# Patient Record
Sex: Male | Born: 1970 | Race: White | Hispanic: No | Marital: Married | Smoking: Former smoker
Health system: Southern US, Community
[De-identification: ages and names within clinical notes are randomized; demographics above are authoritative.]

## PROBLEM LIST (undated history)

## (undated) DIAGNOSIS — K219 Gastro-esophageal reflux disease without esophagitis: Secondary | ICD-10-CM

## (undated) DIAGNOSIS — I82409 Acute embolism and thrombosis of unspecified deep veins of unspecified lower extremity: Secondary | ICD-10-CM

## (undated) DIAGNOSIS — A0472 Enterocolitis due to Clostridium difficile, not specified as recurrent: Secondary | ICD-10-CM

## (undated) DIAGNOSIS — M25511 Pain in right shoulder: Secondary | ICD-10-CM

## (undated) DIAGNOSIS — E7800 Pure hypercholesterolemia, unspecified: Secondary | ICD-10-CM

## (undated) DIAGNOSIS — D6851 Activated protein C resistance: Secondary | ICD-10-CM

## (undated) DIAGNOSIS — I10 Essential (primary) hypertension: Secondary | ICD-10-CM

## (undated) DIAGNOSIS — R011 Cardiac murmur, unspecified: Secondary | ICD-10-CM

## (undated) HISTORY — DX: Acute embolism and thrombosis of unspecified deep veins of unspecified lower extremity: I82.409

## (undated) HISTORY — DX: Pure hypercholesterolemia, unspecified: E78.00

## (undated) HISTORY — PX: OTHER SURGICAL HISTORY: SHX169

## (undated) HISTORY — DX: Gastro-esophageal reflux disease without esophagitis: K21.9

## (undated) HISTORY — PX: APPENDECTOMY: SHX54

## (undated) HISTORY — DX: Pain in right shoulder: M25.511

## (undated) HISTORY — PX: VASECTOMY: SHX75

---

## 2011-07-09 ENCOUNTER — Encounter: Payer: Self-pay | Admitting: *Deleted

## 2011-07-09 ENCOUNTER — Emergency Department (HOSPITAL_COMMUNITY)
Admission: EM | Admit: 2011-07-09 | Discharge: 2011-07-09 | Disposition: A | Discharge status: Home / Self Care | Payer: Self-pay | Attending: Emergency Medicine | Admitting: Emergency Medicine

## 2011-07-09 DIAGNOSIS — L0390 Cellulitis, unspecified: Secondary | ICD-10-CM

## 2011-07-09 DIAGNOSIS — L02519 Cutaneous abscess of unspecified hand: Secondary | ICD-10-CM | POA: Insufficient documentation

## 2011-07-09 DIAGNOSIS — F17200 Nicotine dependence, unspecified, uncomplicated: Secondary | ICD-10-CM | POA: Insufficient documentation

## 2011-07-09 DIAGNOSIS — I10 Essential (primary) hypertension: Secondary | ICD-10-CM | POA: Insufficient documentation

## 2011-07-09 DIAGNOSIS — L03119 Cellulitis of unspecified part of limb: Secondary | ICD-10-CM | POA: Insufficient documentation

## 2011-07-09 HISTORY — DX: Essential (primary) hypertension: I10

## 2011-07-09 MED ORDER — CEPHALEXIN 500 MG PO CAPS: 500.0000 mg | ORAL_CAPSULE | Freq: Four times a day (QID) | ORAL | Status: AC

## 2011-07-09 MED ORDER — HYDROCODONE-ACETAMINOPHEN 5-500 MG PO TABS: 1.0000 | ORAL_TABLET | Freq: Four times a day (QID) | ORAL | Status: AC | PRN

## 2011-07-09 MED ORDER — CEFTRIAXONE SODIUM 1 G IJ SOLR
1.0000 g | Freq: Once | INTRAMUSCULAR | Status: AC
  Administered 2011-07-09: 1 g via INTRAMUSCULAR
  Filled 2011-07-09: qty 1

## 2011-07-09 MED ORDER — STERILE WATER FOR INJECTION IJ SOLN
10.0000 mL | Freq: Once | INTRAMUSCULAR | Status: AC
  Administered 2011-07-09: 10 mL via INTRAMUSCULAR

## 2011-07-09 MED ORDER — CEFTRIAXONE SODIUM 250 MG IJ SOLR
1000.0000 mg | Freq: Once | INTRAMUSCULAR | Status: DC
  Filled 2011-07-09: qty 250

## 2011-07-09 MED ORDER — CEPHALEXIN 500 MG PO CAPS
500.0000 mg | ORAL_CAPSULE | Freq: Once | ORAL | Status: AC
  Administered 2011-07-09: 500 mg via ORAL
  Filled 2011-07-09: qty 1

## 2011-07-09 MED ORDER — HYDROCODONE-ACETAMINOPHEN 5-325 MG PO TABS
2.0000 | ORAL_TABLET | Freq: Once | ORAL | Status: AC
  Administered 2011-07-09: 2 via ORAL
  Filled 2011-07-09: qty 2

## 2011-07-09 NOTE — ED Provider Notes (Signed)
History     CSN: [id number] Arrival date & time: 07/09/2011  1:05 PM  Chief Complaint  Patient presents with  . Wound Infection   HPI Comments: Had a blister on the right hand from his wedding band.  Picked at it, now it is swollen, red, and painful.  No fevers or chills.  No other injury or trauma.  The history is provided by the patient.    Past Medical History  Diagnosis Date  . Hypertension     Past Surgical History  Procedure Date  . Appendectomy   . Vasectomy     History reviewed. No pertinent family history.  History  Substance Use Topics  . Smoking status: Current Everyday Smoker -- 1.5 packs/day    Types: Cigarettes  . Smokeless tobacco: Not on file  . Alcohol Use: No      Review of Systems  Constitutional: Negative.  Negative for fever and chills.  HENT: Negative.   Musculoskeletal:       Hand pain as above  Skin:       Redness, swelling as above.    Physical Exam  BP 144/101  Pulse 80  Temp(Src) 98.4 F (36.9 C) (Oral)  Resp 16  Ht 5\' 8"  (1.727 m)  Wt 170 lb (77.111 kg)  BMI 25.85 kg/m2  SpO2 100%  Physical Exam  Constitutional: He appears well-developed and well-nourished. No distress.  HENT:  Head: Normocephalic and atraumatic.  Neck: Normal range of motion. Neck supple.  Musculoskeletal:       The right hand is noted to have swelling, redness, warmth to the volar aspect.  There is some pain with rom.  Skin: He is not diaphoretic.    ED Course  Procedures  MDM Patient advised that if this does not improve in the next 1-2 days, he needs to return to the ER for re-evaluation, may require surgery.      Geoffery Lyons, MD 07/09/11 [phone]

## 2011-07-09 NOTE — ED Notes (Signed)
Pt has pain, swelling and redness to his left hand. Pt had a blister on the bottom of his ring finger approx. 1 week ago and then the symptoms started yesterday.

## 2012-02-09 ENCOUNTER — Other Ambulatory Visit (HOSPITAL_COMMUNITY): Payer: Self-pay | Admitting: Family Medicine

## 2012-02-09 ENCOUNTER — Ambulatory Visit (HOSPITAL_COMMUNITY)
Admission: RE | Admit: 2012-02-09 | Discharge: 2012-02-09 | Disposition: A | Discharge status: Home / Self Care | Payer: BC Managed Care – PPO | Source: Ambulatory Visit | Attending: Family Medicine | Admitting: Family Medicine

## 2012-02-09 DIAGNOSIS — M7730 Calcaneal spur, unspecified foot: Secondary | ICD-10-CM | POA: Insufficient documentation

## 2012-02-09 DIAGNOSIS — M12879 Other specific arthropathies, not elsewhere classified, unspecified ankle and foot: Secondary | ICD-10-CM

## 2012-06-20 ENCOUNTER — Emergency Department (HOSPITAL_COMMUNITY)
Admission: EM | Admit: 2012-06-20 | Discharge: 2012-06-20 | Disposition: A | Discharge status: Home / Self Care | Payer: BC Managed Care – PPO | Attending: Emergency Medicine | Admitting: Emergency Medicine

## 2012-06-20 ENCOUNTER — Emergency Department (HOSPITAL_COMMUNITY): Payer: BC Managed Care – PPO

## 2012-06-20 ENCOUNTER — Encounter (HOSPITAL_COMMUNITY): Payer: Self-pay | Admitting: *Deleted

## 2012-06-20 DIAGNOSIS — IMO0002 Reserved for concepts with insufficient information to code with codable children: Secondary | ICD-10-CM

## 2012-06-20 DIAGNOSIS — F17200 Nicotine dependence, unspecified, uncomplicated: Secondary | ICD-10-CM | POA: Insufficient documentation

## 2012-06-20 DIAGNOSIS — M6580 Other synovitis and tenosynovitis, unspecified site: Secondary | ICD-10-CM | POA: Insufficient documentation

## 2012-06-20 DIAGNOSIS — I10 Essential (primary) hypertension: Secondary | ICD-10-CM | POA: Insufficient documentation

## 2012-06-20 DIAGNOSIS — M79631 Pain in right forearm: Secondary | ICD-10-CM

## 2012-06-20 DIAGNOSIS — M79609 Pain in unspecified limb: Secondary | ICD-10-CM | POA: Insufficient documentation

## 2012-06-20 DIAGNOSIS — Z79899 Other long term (current) drug therapy: Secondary | ICD-10-CM | POA: Insufficient documentation

## 2012-06-20 MED ORDER — IBUPROFEN 800 MG PO TABS
800.0000 mg | ORAL_TABLET | Freq: Once | ORAL | Status: AC
  Administered 2012-06-20: 800 mg via ORAL
  Filled 2012-06-20: qty 1

## 2012-06-20 MED ORDER — HYDROCODONE-ACETAMINOPHEN 5-325 MG PO TABS: ORAL_TABLET | ORAL | Status: AC

## 2012-06-20 MED ORDER — HYDROCODONE-ACETAMINOPHEN 5-325 MG PO TABS
1.0000 | ORAL_TABLET | Freq: Once | ORAL | Status: DC
  Filled 2012-06-20: qty 1

## 2012-06-20 NOTE — ED Provider Notes (Signed)
History     CSN: [id number]  Arrival date & time 06/20/12  1844   First MD Initiated Contact with Patient 06/20/12 1915      Chief Complaint  Patient presents with  . Arm Pain    (Consider location/radiation/quality/duration/timing/severity/associated sxs/prior treatment) HPI Comments: Patient c/o pain to the right forearm for several months.  States the pain is worse with movement and lifting objects with the right arm and improves with rest.  States he lifts and pulls on objects most of the day.  He states occasionally the pain radiates up to his shoulder and neck.  He denies recent injury, swelling, numbness, chest pain, shortness of breath or weakness  The history is provided by the patient.    Past Medical History  Diagnosis Date  . Hypertension     Past Surgical History  Procedure Date  . Appendectomy   . Vasectomy     History reviewed. No pertinent family history.  History  Substance Use Topics  . Smoking status: Current Everyday Smoker -- 1.5 packs/day    Types: Cigarettes  . Smokeless tobacco: Not on file  . Alcohol Use: No      Review of Systems  Constitutional: Negative for fever and chills.  Genitourinary: Negative for dysuria and difficulty urinating.  Musculoskeletal: Positive for joint swelling and arthralgias.  Skin: Negative for color change and wound.  All other systems reviewed and are negative.    Allergies  Benicar  Home Medications   Current Outpatient Rx  Name Route Sig Dispense Refill  . AMLODIPINE BESYLATE 10 MG PO TABS Oral Take 10 mg by mouth daily.      Deeann Dowse EXTRA STRENGTH PO Oral Take 1 Container by mouth daily as needed. For pain     . HYDROCHLOROTHIAZIDE 25 MG PO TABS Oral Take 25 mg by mouth daily.      Marland Kitchen NAPROXEN 500 MG PO TABS Oral Take 500 mg by mouth 2 (two) times daily with a meal.    . PANTOPRAZOLE SODIUM 40 MG PO TBEC Oral Take 40 mg by mouth daily.    Marland Kitchen SIMVASTATIN 20 MG PO TABS Oral Take 20 mg by mouth every  evening.      BP 122/75  Pulse 79  Temp 98.4 F (36.9 C) (Oral)  Resp 20  Ht 5\' 8"  (1.727 m)  Wt 170 lb (77.111 kg)  BMI 25.85 kg/m2  SpO2 100%  Physical Exam  Nursing note and vitals reviewed. Constitutional: He is oriented to person, place, and time. He appears well-developed and well-nourished. No distress.  HENT:  Head: Normocephalic and atraumatic.  Cardiovascular: Normal rate, regular rhythm and normal heart sounds.   Pulmonary/Chest: Effort normal and breath sounds normal.  Musculoskeletal: He exhibits tenderness. He exhibits no edema.       Right elbow: He exhibits normal range of motion, no swelling, no effusion, no deformity and no laceration. tenderness found. Lateral epicondyle tenderness noted.       Arms:      ttp of the muscles of the right forearm and at the lateral epicondyle.  Radial pulse is brisk, sensation intact.  CR< 2 sec.  No bruising or deformity.  Patient has full ROM.  Neurological: He is alert and oriented to person, place, and time. No cranial nerve deficit or sensory deficit. He exhibits normal muscle tone. Coordination normal.  Reflex Scores:      Tricep reflexes are 2+ on the right side and 2+ on the left side.  Bicep reflexes are 2+ on the right side and 2+ on the left side. Skin: Skin is warm and dry.    ED Course  Procedures (including critical care time)  Labs Reviewed - No data to display Dg Forearm Right  06/20/2012  *RADIOLOGY REPORT*  Clinical Data: Right forearm pain for several months.  RIGHT FOREARM - 2 VIEW  Comparison: None.  Findings: There is no evidence of fracture or dislocation.  There is no evidence of arthropathy or other focal bony abnormality. Soft tissues are unremarkable.  IMPRESSION: Negative exam.  Original Report Authenticated By: Elsie Stain, M.D.        MDM     Sling applied to the right arm. Pain is reproduced with palpation and movement of the right forearm. Tenderness over the muscles of the  forearm. And the lateral epicondyles. No bony abnormality. Radial pulse is brisk, distal sensation is intact,  cap refill is less than 2 seconds. Patient has a appointment with his primary care physician in 2 days. Symptoms are likely related to a tendinitis I will also give him a referral to Dr. Romeo Apple   The patient appears reasonably screened and/or stabilized for discharge and I doubt any other medical condition or other [facility] requiring further screening, evaluation, or treatment in the ED at this time prior to discharge.   Prescribed:  norco #[id number]   L. [place], [place] 06/26/12 2130

## 2012-06-20 NOTE — ED Notes (Signed)
Right mid arm pain for couple of months per pt, denies recent injury, hx fall 2 years ago

## 2012-06-20 NOTE — ED Notes (Signed)
Pt stable at discharge Pt instructed not to drive while on pain medication Verbalizes understanding

## 2012-06-29 NOTE — ED Provider Notes (Signed)
Medical screening examination/treatment/procedure(s) were performed by non-physician practitioner and as supervising physician I was immediately available for consultation/collaboration.   , MD 06/29/12 0722 

## 2012-12-05 DIAGNOSIS — I82409 Acute embolism and thrombosis of unspecified deep veins of unspecified lower extremity: Secondary | ICD-10-CM

## 2012-12-05 HISTORY — DX: Acute embolism and thrombosis of unspecified deep veins of unspecified lower extremity: I82.409

## 2013-01-08 ENCOUNTER — Other Ambulatory Visit (HOSPITAL_COMMUNITY): Payer: Self-pay | Admitting: Orthopaedic Surgery

## 2013-01-08 DIAGNOSIS — M25511 Pain in right shoulder: Secondary | ICD-10-CM

## 2013-01-10 ENCOUNTER — Other Ambulatory Visit (HOSPITAL_COMMUNITY): Payer: BC Managed Care – PPO

## 2013-01-11 ENCOUNTER — Ambulatory Visit (HOSPITAL_COMMUNITY)
Admission: RE | Admit: 2013-01-11 | Discharge: 2013-01-11 | Disposition: A | Discharge status: Home / Self Care | Payer: BC Managed Care – PPO | Source: Ambulatory Visit | Attending: Orthopaedic Surgery | Admitting: Orthopaedic Surgery

## 2013-01-11 ENCOUNTER — Encounter (HOSPITAL_COMMUNITY): Payer: Self-pay

## 2013-01-11 DIAGNOSIS — R937 Abnormal findings on diagnostic imaging of other parts of musculoskeletal system: Secondary | ICD-10-CM | POA: Insufficient documentation

## 2013-01-11 DIAGNOSIS — M25511 Pain in right shoulder: Secondary | ICD-10-CM

## 2013-01-11 DIAGNOSIS — M25519 Pain in unspecified shoulder: Secondary | ICD-10-CM | POA: Insufficient documentation

## 2013-01-11 DIAGNOSIS — M719 Bursopathy, unspecified: Secondary | ICD-10-CM | POA: Insufficient documentation

## 2013-01-11 DIAGNOSIS — M67919 Unspecified disorder of synovium and tendon, unspecified shoulder: Secondary | ICD-10-CM | POA: Insufficient documentation

## 2013-04-01 ENCOUNTER — Encounter (HOSPITAL_COMMUNITY): Payer: Self-pay | Admitting: Oncology

## 2013-04-01 ENCOUNTER — Encounter (HOSPITAL_COMMUNITY): Payer: BC Managed Care – PPO | Attending: Oncology | Admitting: Oncology

## 2013-04-01 VITALS — BP 128/90 | HR 74 | Temp 98.4°F | Resp 18 | Ht 66.25 in | Wt 174.4 lb

## 2013-04-01 DIAGNOSIS — Z832 Family history of diseases of the blood and blood-forming organs and certain disorders involving the immune mechanism: Secondary | ICD-10-CM

## 2013-04-01 DIAGNOSIS — D6859 Other primary thrombophilia: Secondary | ICD-10-CM | POA: Insufficient documentation

## 2013-04-01 DIAGNOSIS — F17200 Nicotine dependence, unspecified, uncomplicated: Secondary | ICD-10-CM | POA: Insufficient documentation

## 2013-04-01 NOTE — Patient Instructions (Addendum)
[facility] Discharge Instructions  RECOMMENDATIONS MADE BY THE CONSULTANT AND ANY TEST RESULTS WILL BE SENT TO YOUR REFERRING PHYSICIAN.  EXAM FINDINGS BY THE PHYSICIAN TODAY AND SIGNS OR SYMPTOMS TO REPORT TO CLINIC OR PRIMARY PHYSICIAN: exam and discussion by MD.  Need to check some blood work and if we need to do anything we will call you.   MEDICATIONS PRESCRIBED:  none   SPECIAL INSTRUCTIONS/FOLLOW-UP: Will schedule return if needed.  Thank you for choosing [facility] to provide your oncology and hematology care.  To afford each patient quality time with our providers, please arrive at least 15 minutes before your scheduled appointment time.  With your help, our goal is to use those 15 minutes to complete the necessary work-up to ensure our physicians have the information they need to help with your evaluation and healthcare recommendations.    Effective January 1st, 2014, we ask that you re-schedule your appointment with our physicians should you arrive 10 or more minutes late for your appointment.  We strive to give you quality time with our providers, and arriving late affects you and other patients whose appointments are after yours.    Again, thank you for choosing [facility].  Our hope is that these requests will decrease the amount of time that you wait before being seen by our physicians.       _____________________________________________________________  Should you have questions after your visit to [facility], please contact our office at [phone] between the hours of 8:30 a.m. and 5:00 p.m.  Voicemails left after 4:30 p.m. will not be returned until the following business day.  For prescription refill requests, have your pharmacy contact our office with your prescription refill request.

## 2013-04-01 NOTE — Progress Notes (Signed)
#  1 history of factor V Leiden deficiency and his mother and niece #2 right rotator cuff damage in need of repair #3 long-standing smoking history of approximately 25 pack years. #4 history of vasectomy without complication #5 history of Teeth removal without complication #6 history of appendectomy without complication #7 history of bunionectomy without complication  This is a very pleasant 42 year old gentleman who has right shoulder pain since last year which is gotten worse and was found to have at least a partial right rotator cuff tear. He is in need of surgery in June.  His family history is absolutely positive for factor V Leiden he states his mother was had a blood clot in her one leg and he has a sister's daughter who has also been on Coumadin for factor V mutation. He believes his sister also has that but she has not been absolutely tested that he is aware of. He has one other brother and sister who have no history.  His father is still alive but with sleep apnea syndrome. He and his mother are divorced.  He himself has 3 children in good health. He and his wife both smoke.  He does not drink or use drugs.  BP 128/90  Pulse 74  Temp(Src) 98.4 F (36.9 C) (Oral)  Resp 18  Ht 5' 6.25" (1.683 m)  Wt 174 lb 6.4 oz (79.107 kg)  BMI 27.93 kg/m2  He is in no acute distress. He has slow but almost full range of motion of the right shoulder joint. He complains of pain with movement. He however has no adenopathy in any location. His lungs are clear but with diminished breath sounds. Heart shows a regular rhythm and rate without murmur rub or gallop. He has an upper dental plate. Throat is clear. He has almost a geographic tongue. He has no thyromegaly. Skin exam is negative for abnormal lesions. He has no hepatosplenomegaly. Bowel sounds are normal. He has no obvious ascites. No leg edema. Pulse 1-2+ and symmetric. He is right handed. Nails are unremarkable without clubbing. There is no  cyanosis. He is alert and oriented. He does wear glasses.  We spent the majority of time talking about his smoking history and the need for cessation. He is absolutely at risk for developing cardiovascular disease, cancer etc. He was given information about [place] smoking cessation classes. I think he and his wife need to do things together.  From the standpoint of his family history he states that he was once tested 5 years ago or so and was told that he thought it was negative.  Only factor V Leiden will be evaluated since he has no personal history of clotting nor does have postoperative complications in his history. We will see him in the future if need be. I suspect the test will be negative.

## 2013-04-02 ENCOUNTER — Encounter (HOSPITAL_BASED_OUTPATIENT_CLINIC_OR_DEPARTMENT_OTHER): Payer: BC Managed Care – PPO

## 2013-04-02 DIAGNOSIS — Z832 Family history of diseases of the blood and blood-forming organs and certain disorders involving the immune mechanism: Secondary | ICD-10-CM

## 2013-04-02 LAB — COMPREHENSIVE METABOLIC PANEL
BUN: 10 mg/dL (ref 6–23)
CO2: 26 mEq/L (ref 19–32)
Calcium: 9.3 mg/dL (ref 8.4–10.5)
Chloride: 102 mEq/L (ref 96–112)
Creatinine, Ser: 1.12 mg/dL (ref 0.50–1.35)
GFR calc Af Amer: >90 mL/min (ref >90)
GFR calc non Af Amer: 79 mL/min — ABNORMAL LOW (ref >90)
Glucose, Bld: 105 mg/dL — ABNORMAL HIGH (ref 70–99)
Total Bilirubin: 0.5 mg/dL (ref 0.3–1.2)

## 2013-04-02 LAB — PROTIME-INR
INR: 0.97 (ref 0.00–1.49)
Prothrombin Time: 12.8 seconds (ref 11.6–15.2)

## 2013-04-02 LAB — CBC WITH DIFFERENTIAL/PLATELET
Basophils Absolute: 0 10*3/uL (ref 0.0–0.1)
Eosinophils Relative: 1 % (ref 0–5)
HCT: 40.5 % (ref 39.0–52.0)
Hemoglobin: 13.7 g/dL (ref 13.0–17.0)
Lymphocytes Relative: 27 % (ref 12–46)
Lymphs Abs: 1.9 10*3/uL (ref 0.7–4.0)
MCV: 85.1 fL (ref 78.0–100.0)
Monocytes Absolute: 0.5 10*3/uL (ref 0.1–1.0)
Monocytes Relative: 6 % (ref 3–12)
RDW: 14 % (ref 11.5–15.5)
WBC: 7.3 10*3/uL (ref 4.0–10.5)

## 2013-04-02 LAB — APTT: aPTT: 33 seconds (ref 24–37)

## 2013-04-02 NOTE — Progress Notes (Signed)
Labs drawn today for cbc/diff,cmp,pt/ptt,factor 5

## 2013-05-01 ENCOUNTER — Other Ambulatory Visit: Payer: Self-pay | Admitting: Radiology

## 2013-05-01 ENCOUNTER — Encounter (HOSPITAL_COMMUNITY): Payer: Self-pay | Admitting: Pharmacy Technician

## 2013-05-07 ENCOUNTER — Encounter (HOSPITAL_COMMUNITY)
Admission: RE | Admit: 2013-05-07 | Discharge: 2013-05-07 | Disposition: A | Discharge status: Home / Self Care | Payer: BC Managed Care – PPO | Source: Ambulatory Visit | Attending: Orthopaedic Surgery | Admitting: Orthopaedic Surgery

## 2013-05-07 ENCOUNTER — Other Ambulatory Visit: Payer: Self-pay

## 2013-05-07 ENCOUNTER — Encounter (HOSPITAL_COMMUNITY): Payer: Self-pay

## 2013-05-07 HISTORY — DX: Cardiac murmur, unspecified: R01.1

## 2013-05-07 LAB — CBC WITH DIFFERENTIAL/PLATELET
Basophils Absolute: 0 10*3/uL (ref 0.0–0.1)
Eosinophils Relative: 2 % (ref 0–5)
HCT: 38.9 % — ABNORMAL LOW (ref 39.0–52.0)
Hemoglobin: 13.6 g/dL (ref 13.0–17.0)
Lymphocytes Relative: 29 % (ref 12–46)
Lymphs Abs: 2 10*3/uL (ref 0.7–4.0)
MCV: 83.1 fL (ref 78.0–100.0)
Monocytes Absolute: 0.5 10*3/uL (ref 0.1–1.0)
Monocytes Relative: 7 % (ref 3–12)
Neutro Abs: 4.1 10*3/uL (ref 1.7–7.7)
RBC: 4.68 MIL/uL (ref 4.22–5.81)
WBC: 6.7 10*3/uL (ref 4.0–10.5)

## 2013-05-07 LAB — COMPREHENSIVE METABOLIC PANEL
AST: 23 U/L (ref 0–37)
CO2: 26 mEq/L (ref 19–32)
Calcium: 9.6 mg/dL (ref 8.4–10.5)
Chloride: 102 mEq/L (ref 96–112)
Creatinine, Ser: 1.07 mg/dL (ref 0.50–1.35)
GFR calc Af Amer: >90 mL/min (ref >90)
GFR calc non Af Amer: 83 mL/min — ABNORMAL LOW (ref >90)
Glucose, Bld: 125 mg/dL — ABNORMAL HIGH (ref 70–99)
Total Bilirubin: 0.3 mg/dL (ref 0.3–1.2)

## 2013-05-07 LAB — SURGICAL PCR SCREEN: Staphylococcus aureus: POSITIVE — AB

## 2013-05-07 MED ORDER — CHLORHEXIDINE GLUCONATE 4 % EX LIQD: 60.0000 mL | Freq: Once | CUTANEOUS | Status: DC

## 2013-05-07 NOTE — Patient Instructions (Signed)
Jonell JAYSTEN ESSNER  05/07/2013   Your procedure is scheduled on:  Tuesday, 05/14/13  Report to [facility] at [place] AM.  Call this number if you have problems the morning of surgery: [phone]   Remember:   Do not eat food or drink liquids after midnight.   Take these medicines the morning of surgery with A SIP OF WATER: norco, norvasc, HCTZ (hydrochlorothiazide), protonix   Do not wear jewelry, make-up or nail polish.  Do not wear lotions, powders, or perfumes. You may wear deodorant.  Do not shave 48 hours prior to surgery. Men may shave face and neck.  Do not bring valuables to the hospital.  [facility] is not responsible                   for any belongings or valuables.  Contacts, dentures or bridgework may not be worn into surgery.  Leave suitcase in the car. After surgery it may be brought to your room.  For patients admitted to the hospital, checkout time is 11:00 AM the day of  discharge.   Patients discharged the day of surgery will not be allowed to drive  home.  Name and phone number of your driver: family  Special Instructions: Incentive Spirometry - Practice and bring it with you on the day of surgery. Shower using CHG 2 nights before surgery and the night before surgery.  If you shower the day of surgery use CHG.  Use special wash - you have one bottle of CHG for all showers.  You should use approximately 1/3 of the bottle for each shower.   Please read over the following fact sheets that you were given: Pain Booklet, MRSA Information, Surgical Site Infection Prevention, Anesthesia Post-op Instructions and Care and Recovery After Surgery Rotator Cuff Tear The rotator cuff is four tendons that assist in the motion of the shoulder. A rotator cuff tear is a tear in one of these four tendons. It is characterized by pain and weakness of the shoulder. The rotator cuff tendons surround the shoulder ball and socket joint (humeral head). The rotator cuff tendons attach to the shoulder blade  (scapula) on one side and the upper arm bone (humerus) on the other side. The rotator cuff is essential for shoulder stability and shoulder motion. SYMPTOMS   Pain around the shoulder, often at the outer portion of the upper arm.  Pain that is worse with shoulder function, especially when reaching overhead or lifting.  Weakness of the shoulder muscles.  Aching when not using your arm; often, pain awakens you at night, especially when sleeping on the affected side.  Tenderness, swelling, warmth, or redness over the outer aspect of the shoulder.  Loss of strength.  Limited motion of the shoulder, especially reaching behind (reaching into one's back pocket) or across your body.  A crackling sound (crepitation) when moving the shoulder.  Biceps tendon pain (in the front of the shoulder) and inflammation, worse with bending the elbow or lifting. CAUSES   Strain from sudden increase in amount or intensity of activity.  Direct blow or injury to the shoulder.  Aging, wear from from normal use.  Roof of the shoulder (acromial) spur. RISK INCREASES WITH:   Contact sports (football, wrestling, or boxing).  Throwing or hitting sports (baseball, tennis, or volleyball).  Weightlifting and bodybuilding.  Heavy labor.  Previous injury to rotator cuff.  Failure to warm up properly before activity.  Inadequate protective equipment.  Increasing age.  Spurring of the  outer end of the scapula (acromion).  Cortisone injections.  Poor shoulder strength and flexibility. PREVENTION  Warm up and stretch properly before activity.  Allow time for rest and recovery between practices and competition.  Maintain physical fitness:  Cardiovascular fitness.  Shoulder flexibility.  Strength and endurance of the rotator cuff muscles and muscles of the shoulder blade.  Learn and use proper technique when throwing or hitting. PROGNOSIS Surgery is often needed. Although, symptoms may go  away by themselves. RELATED COMPLICATIONS   Persistent pain that may progress to constant pain.  Shoulder stiffness, frozen shoulder syndrome, or loss of motion.  Recurrence of symptoms, especially if treated without surgery.  Inability to return to same level of sports, even with surgery.  Persistent weakness.  Risks of surgery, including infection, bleeding, injury to nerves, shoulder stiffness, weakness, re-tearing of the rotator cuff tendon.  Deltoid detachment, acromial fracture, and persistent pain. TREATMENT Treatment involves the use of ice and medicine to reduce pain and inflammation. Strengthening and stretching exercise are usually recommended. These exercises may be completed at home or with a therapist. You may also be instructed to modify offending activities. Corticosteroid injections may be given to reduce inflammation. Surgery is usually recommended for athletes. Surgery has the best chance for a full recovery. Surgery involves:  Removal of an inflamed bursa.  Removal of an acromial spur if present.  Suturing the torn tendon back together. Rotator cuff surgeries may be preformed either arthroscopically or through an open incision. Recovery typically takes 6 to 12 months. MEDICATION  If pain medicine is necessary, then nonsteroidal anti-inflammatory medicines, such as aspirin and ibuprofen, or other minor pain relievers, such as acetaminophen, are often recommended.  Do not take pain medicine for 7 days before surgery.  Prescription pain relievers are usually only prescribed after surgery. Use only as directed and only as much as you need.  Corticosteroid injections may be given to reduce inflammation. However, there is a limited number of times the joint may be injected with these medicines. HEAT AND COLD  Cold treatment (icing) relieves pain and reduces inflammation. Cold treatment should be applied for 10 to 15 minutes every 2 to 3 hours for inflammation and  pain and immediately after any activity that aggravates your symptoms. Use ice packs or massage the area with a piece of ice (ice massage).  Heat treatment may be used prior to performing the stretching and strengthening activities prescribed by your caregiver, physical therapist, or athletic trainer. Use a heat pack or soak the injury in warm water. SEEK MEDICAL CARE IF:   Symptoms get worse or do not improve in 4 to 6 weeks despite treatment.  You experience pain, numbness, or coldness in the hand.  Blue, gray, or dark color appears in the fingernails.  New, unexplained symptoms develop (drugs used in treatment may produce side effects). Document Released: 11/21/2005 Document Revised: 02/13/2012 Document Reviewed: 03/05/2009 [facility] Patient Information 2014 [place], [place].

## 2013-05-14 ENCOUNTER — Encounter (HOSPITAL_COMMUNITY): Payer: Self-pay | Admitting: Anesthesiology

## 2013-05-14 ENCOUNTER — Encounter (HOSPITAL_COMMUNITY)
Admission: RE | Disposition: A | Discharge status: Home / Self Care | Payer: Self-pay | Source: Ambulatory Visit | Attending: Orthopaedic Surgery

## 2013-05-14 ENCOUNTER — Ambulatory Visit (HOSPITAL_COMMUNITY): Payer: BC Managed Care – PPO | Admitting: Anesthesiology

## 2013-05-14 ENCOUNTER — Observation Stay (HOSPITAL_COMMUNITY)
Admission: RE | Admit: 2013-05-14 | Discharge: 2013-05-15 | Disposition: A | Discharge status: Home / Self Care | Payer: BC Managed Care – PPO | Source: Ambulatory Visit | Attending: Orthopaedic Surgery | Admitting: Orthopaedic Surgery

## 2013-05-14 ENCOUNTER — Encounter (HOSPITAL_COMMUNITY): Payer: Self-pay | Admitting: *Deleted

## 2013-05-14 DIAGNOSIS — X58XXXA Exposure to other specified factors, initial encounter: Secondary | ICD-10-CM | POA: Insufficient documentation

## 2013-05-14 DIAGNOSIS — Z01812 Encounter for preprocedural laboratory examination: Secondary | ICD-10-CM | POA: Insufficient documentation

## 2013-05-14 DIAGNOSIS — S43429A Sprain of unspecified rotator cuff capsule, initial encounter: Principal | ICD-10-CM | POA: Insufficient documentation

## 2013-05-14 DIAGNOSIS — I10 Essential (primary) hypertension: Secondary | ICD-10-CM | POA: Insufficient documentation

## 2013-05-14 DIAGNOSIS — M75101 Unspecified rotator cuff tear or rupture of right shoulder, not specified as traumatic: Secondary | ICD-10-CM

## 2013-05-14 HISTORY — PX: SHOULDER OPEN ROTATOR CUFF REPAIR: SHX2407

## 2013-05-14 LAB — URINALYSIS, ROUTINE W REFLEX MICROSCOPIC
Bilirubin Urine: NEGATIVE
Nitrite: NEGATIVE
Protein, ur: NEGATIVE mg/dL
Specific Gravity, Urine: 1.01 (ref 1.005–1.030)
Urobilinogen, UA: 0.2 mg/dL (ref 0.0–1.0)

## 2013-05-14 SURGERY — REPAIR, ROTATOR CUFF, OPEN
Anesthesia: General | Site: Shoulder | Laterality: Right | Wound class: Clean

## 2013-05-14 MED ORDER — LACTATED RINGERS IV SOLN
INTRAVENOUS | Status: DC
  Administered 2013-05-14: 07:00:00 via INTRAVENOUS

## 2013-05-14 MED ORDER — DEXTROSE-NACL 5-0.45 % IV SOLN
INTRAVENOUS | Status: DC
  Administered 2013-05-14: 12:00:00 via INTRAVENOUS

## 2013-05-14 MED ORDER — SIMVASTATIN 20 MG PO TABS
20.0000 mg | ORAL_TABLET | Freq: Every evening | ORAL | Status: DC
  Administered 2013-05-14: 20 mg via ORAL
  Filled 2013-05-14: qty 1

## 2013-05-14 MED ORDER — SUFENTANIL CITRATE 50 MCG/ML IV SOLN
INTRAVENOUS | Status: DC | PRN
  Administered 2013-05-14 (×2): 10 ug via INTRAVENOUS
  Administered 2013-05-14: 20 ug via INTRAVENOUS
  Administered 2013-05-14: 10 ug via INTRAVENOUS

## 2013-05-14 MED ORDER — MORPHINE SULFATE (PF) 1 MG/ML IV SOLN
INTRAVENOUS | Status: DC
  Administered 2013-05-14: 12:00:00 via INTRAVENOUS
  Administered 2013-05-14: 9 mg via INTRAVENOUS
  Filled 2013-05-14: qty 25

## 2013-05-14 MED ORDER — NALOXONE HCL 0.4 MG/ML IJ SOLN: 0.4000 mg | INTRAMUSCULAR | Status: DC | PRN

## 2013-05-14 MED ORDER — ONDANSETRON HCL 4 MG/2ML IJ SOLN
4.0000 mg | Freq: Once | INTRAMUSCULAR | Status: AC
  Administered 2013-05-14: 4 mg via INTRAVENOUS

## 2013-05-14 MED ORDER — SUFENTANIL CITRATE 50 MCG/ML IV SOLN
INTRAVENOUS | Status: AC
  Filled 2013-05-14: qty 1

## 2013-05-14 MED ORDER — LACTATED RINGERS IV SOLN
INTRAVENOUS | Status: DC | PRN
  Administered 2013-05-14 (×2): via INTRAVENOUS

## 2013-05-14 MED ORDER — CEFAZOLIN SODIUM-DEXTROSE 2-3 GM-% IV SOLR
2.0000 g | INTRAVENOUS | Status: AC
  Administered 2013-05-14: 2 g via INTRAVENOUS

## 2013-05-14 MED ORDER — NEOSTIGMINE METHYLSULFATE 1 MG/ML IJ SOLN
INTRAMUSCULAR | Status: DC | PRN
  Administered 2013-05-14: 4 mg via INTRAVENOUS

## 2013-05-14 MED ORDER — SODIUM CHLORIDE 0.9 % IJ SOLN: 9.0000 mL | INTRAMUSCULAR | Status: DC | PRN

## 2013-05-14 MED ORDER — ONDANSETRON HCL 4 MG/2ML IJ SOLN: 4.0000 mg | Freq: Once | INTRAMUSCULAR | Status: DC | PRN

## 2013-05-14 MED ORDER — HYDROCODONE-ACETAMINOPHEN 5-325 MG PO TABS
1.0000 | ORAL_TABLET | ORAL | Status: DC | PRN
  Administered 2013-05-14 – 2013-05-15 (×2): 1 via ORAL
  Filled 2013-05-14 (×2): qty 1

## 2013-05-14 MED ORDER — DIPHENHYDRAMINE HCL 50 MG/ML IJ SOLN: 12.5000 mg | Freq: Four times a day (QID) | INTRAMUSCULAR | Status: DC | PRN

## 2013-05-14 MED ORDER — 0.9 % SODIUM CHLORIDE (POUR BTL) OPTIME
TOPICAL | Status: DC | PRN
  Administered 2013-05-14: 1000 mL

## 2013-05-14 MED ORDER — APIXABAN 2.5 MG PO TABS
2.5000 mg | ORAL_TABLET | Freq: Two times a day (BID) | ORAL | Status: DC
  Administered 2013-05-14: 2.5 mg via ORAL
  Filled 2013-05-14 (×2): qty 1

## 2013-05-14 MED ORDER — ROCURONIUM BROMIDE 100 MG/10ML IV SOLN
INTRAVENOUS | Status: DC | PRN
  Administered 2013-05-14: 40 mg via INTRAVENOUS

## 2013-05-14 MED ORDER — LIDOCAINE HCL (CARDIAC) 20 MG/ML IV SOLN
INTRAVENOUS | Status: DC | PRN
  Administered 2013-05-14: 50 mg via INTRAVENOUS

## 2013-05-14 MED ORDER — SUCCINYLCHOLINE CHLORIDE 20 MG/ML IJ SOLN
INTRAMUSCULAR | Status: AC
  Filled 2013-05-14: qty 1

## 2013-05-14 MED ORDER — ROCURONIUM BROMIDE 50 MG/5ML IV SOLN
INTRAVENOUS | Status: AC
  Filled 2013-05-14: qty 1

## 2013-05-14 MED ORDER — AMLODIPINE BESYLATE 5 MG PO TABS: 10.0000 mg | ORAL_TABLET | Freq: Every day | ORAL | Status: DC

## 2013-05-14 MED ORDER — PROPOFOL 10 MG/ML IV BOLUS
INTRAVENOUS | Status: DC | PRN
  Administered 2013-05-14: 150 mg via INTRAVENOUS

## 2013-05-14 MED ORDER — PROMETHAZINE HCL 25 MG/ML IJ SOLN
12.5000 mg | INTRAMUSCULAR | Status: DC | PRN
  Administered 2013-05-14: 12.5 mg via INTRAMUSCULAR
  Filled 2013-05-14: qty 1

## 2013-05-14 MED ORDER — CEFAZOLIN SODIUM-DEXTROSE 2-3 GM-% IV SOLR
INTRAVENOUS | Status: AC
  Filled 2013-05-14: qty 50

## 2013-05-14 MED ORDER — LIDOCAINE HCL (PF) 1 % IJ SOLN
INTRAMUSCULAR | Status: AC
  Filled 2013-05-14: qty 5

## 2013-05-14 MED ORDER — EPHEDRINE SULFATE 50 MG/ML IJ SOLN
INTRAMUSCULAR | Status: AC
  Filled 2013-05-14: qty 1

## 2013-05-14 MED ORDER — MIDAZOLAM HCL 2 MG/2ML IJ SOLN
1.0000 mg | INTRAMUSCULAR | Status: DC | PRN
  Administered 2013-05-14: 2 mg via INTRAVENOUS

## 2013-05-14 MED ORDER — FENTANYL CITRATE 0.05 MG/ML IJ SOLN
INTRAMUSCULAR | Status: AC
  Filled 2013-05-14: qty 2

## 2013-05-14 MED ORDER — ONDANSETRON HCL 4 MG/2ML IJ SOLN
INTRAMUSCULAR | Status: AC
  Filled 2013-05-14: qty 2

## 2013-05-14 MED ORDER — NAPROXEN 250 MG PO TABS
500.0000 mg | ORAL_TABLET | Freq: Two times a day (BID) | ORAL | Status: DC
  Administered 2013-05-14 – 2013-05-15 (×2): 500 mg via ORAL
  Filled 2013-05-14 (×2): qty 2

## 2013-05-14 MED ORDER — HYDROCHLOROTHIAZIDE 25 MG PO TABS: 25.0000 mg | ORAL_TABLET | Freq: Every day | ORAL | Status: DC

## 2013-05-14 MED ORDER — ONDANSETRON HCL 4 MG/2ML IJ SOLN
4.0000 mg | Freq: Four times a day (QID) | INTRAMUSCULAR | Status: DC | PRN
  Administered 2013-05-15: 4 mg via INTRAVENOUS
  Filled 2013-05-14 (×2): qty 2

## 2013-05-14 MED ORDER — DIPHENHYDRAMINE HCL 12.5 MG/5ML PO ELIX: 12.5000 mg | ORAL_SOLUTION | Freq: Four times a day (QID) | ORAL | Status: DC | PRN

## 2013-05-14 MED ORDER — ZOLPIDEM TARTRATE 5 MG PO TABS: 5.0000 mg | ORAL_TABLET | Freq: Every day | ORAL | Status: DC

## 2013-05-14 MED ORDER — PROPOFOL 10 MG/ML IV EMUL
INTRAVENOUS | Status: AC
  Filled 2013-05-14: qty 20

## 2013-05-14 MED ORDER — HYDROMORPHONE HCL PF 1 MG/ML IJ SOLN
INTRAMUSCULAR | Status: AC
  Filled 2013-05-14: qty 1

## 2013-05-14 MED ORDER — FENTANYL CITRATE 0.05 MG/ML IJ SOLN
25.0000 ug | INTRAMUSCULAR | Status: DC | PRN
  Administered 2013-05-14 (×4): 50 ug via INTRAVENOUS

## 2013-05-14 MED ORDER — PANTOPRAZOLE SODIUM 40 MG PO TBEC: 40.0000 mg | DELAYED_RELEASE_TABLET | Freq: Every day | ORAL | Status: DC

## 2013-05-14 MED ORDER — MIDAZOLAM HCL 2 MG/2ML IJ SOLN
INTRAMUSCULAR | Status: AC
  Filled 2013-05-14: qty 2

## 2013-05-14 MED ORDER — HYDROMORPHONE HCL PF 1 MG/ML IJ SOLN
0.5000 mg | INTRAMUSCULAR | Status: AC | PRN
  Administered 2013-05-14 (×2): 0.5 mg via INTRAVENOUS

## 2013-05-14 MED ORDER — GLYCOPYRROLATE 0.2 MG/ML IJ SOLN
INTRAMUSCULAR | Status: DC | PRN
  Administered 2013-05-14: 0.6 mg via INTRAVENOUS

## 2013-05-14 SURGICAL SUPPLY — 43 items
BAG HAMPER (MISCELLANEOUS) ×2 IMPLANT
BNDG COHESIVE 4X5 TAN NS LF (GAUZE/BANDAGES/DRESSINGS) ×2 IMPLANT
CLOTH BEACON ORANGE TIMEOUT ST (SAFETY) ×2 IMPLANT
COOLER CRYO CUFF IC AND MOTOR (MISCELLANEOUS) ×2 IMPLANT
COVER LIGHT HANDLE STERIS (MISCELLANEOUS) ×4 IMPLANT
COVER MAYO STAND XLG (DRAPE) ×2 IMPLANT
CUFF CRYO UNI SHDR 32X48 (MISCELLANEOUS) ×2 IMPLANT
DRAPE PROXIMA HALF (DRAPES) ×2 IMPLANT
DURAPREP 26ML APPLICATOR (WOUND CARE) ×2 IMPLANT
FORMALIN 10 PREFIL 120ML (MISCELLANEOUS) ×2 IMPLANT
GAUZE XEROFORM 5X9 LF (GAUZE/BANDAGES/DRESSINGS) ×2 IMPLANT
GLOVE BIO SURGEON STRL SZ8 (GLOVE) ×2 IMPLANT
GLOVE BIO SURGEON STRL SZ8.5 (GLOVE) ×4 IMPLANT
GLOVE BIOGEL PI IND STRL 7.0 (GLOVE) ×2 IMPLANT
GLOVE BIOGEL PI INDICATOR 7.0 (GLOVE) ×2
GLOVE ECLIPSE 7.0 STRL STRAW (GLOVE) ×2 IMPLANT
GLOVE EXAM NITRILE LRG STRL (GLOVE) ×2 IMPLANT
GLOVE SS BIOGEL STRL SZ 6.5 (GLOVE) ×1 IMPLANT
GLOVE SUPERSENSE BIOGEL SZ 6.5 (GLOVE) ×1
GOWN STRL REIN XL XLG (GOWN DISPOSABLE) ×6 IMPLANT
INST SET MINOR BONE (KITS) ×2 IMPLANT
KIT ROOM TURNOVER APOR (KITS) ×2 IMPLANT
KIT SURGICAL DEVON (SET/KITS/TRAYS/PACK) ×2 IMPLANT
MANIFOLD NEPTUNE II (INSTRUMENTS) ×2 IMPLANT
MARKER SKIN DUAL TIP RULER LAB (MISCELLANEOUS) ×2 IMPLANT
NS IRRIG 1000ML POUR BTL (IV SOLUTION) ×2 IMPLANT
PACK MINOR (CUSTOM PROCEDURE TRAY) ×2 IMPLANT
PAD ABD 5X9 TENDERSORB (GAUZE/BANDAGES/DRESSINGS) ×2 IMPLANT
PAD ARMBOARD 7.5X6 YLW CONV (MISCELLANEOUS) ×2 IMPLANT
RASP LG TEAR CROSS CUT (RASP) ×2 IMPLANT
SET BASIN LINEN APH (SET/KITS/TRAYS/PACK) ×2 IMPLANT
SPONGE GAUZE 4X4 12PLY (GAUZE/BANDAGES/DRESSINGS) ×2 IMPLANT
SPONGE LAP 18X18 X RAY DECT (DISPOSABLE) ×2 IMPLANT
STOCKINETTE IMPERVIOUS LG (DRAPES) ×2 IMPLANT
STRIP CLOSURE SKIN 1/4X3 (GAUZE/BANDAGES/DRESSINGS) ×2 IMPLANT
SUT BRALON NAB BRD #1 30IN (SUTURE) ×4 IMPLANT
SUT CHROMIC 0 CTX 36 (SUTURE) ×4 IMPLANT
SUT ETHILON 3 0 FSL (SUTURE) ×2 IMPLANT
SUT PLAIN 2 0 XLH (SUTURE) ×4 IMPLANT
SUT PROLENE 2 0 SH 30 (SUTURE) ×2 IMPLANT
SYR BULB IRRIGATION 50ML (SYRINGE) ×2 IMPLANT
TAPE MEDIFIX FOAM 3 (GAUZE/BANDAGES/DRESSINGS) ×2 IMPLANT
YANKAUER SUCT 12FT TUBE ARGYLE (SUCTIONS) ×2 IMPLANT

## 2013-05-14 NOTE — Transfer of Care (Signed)
Immediate Anesthesia Transfer of Care Note  Patient: Preston Davis  Procedure(s) Performed: Procedure(s): ROTATOR CUFF REPAIR SHOULDER OPEN (Right)  Patient Location: PACU  Anesthesia Type:General  Level of Consciousness: sedated and patient cooperative  Airway & Oxygen Therapy: Patient Spontanous Breathing and Patient connected to face mask oxygen  Post-op Assessment: Report given to PACU RN and Post -op Vital signs reviewed and stable  Post vital signs: Reviewed and stable  Complications: No apparent anesthesia complications

## 2013-05-14 NOTE — Anesthesia Preprocedure Evaluation (Signed)
Anesthesia Evaluation  Patient identified by MRN, date of birth, ID band Patient awake    Reviewed: Allergy & Precautions, H&P , NPO status , Patient's Chart, lab work & pertinent test results  Airway Mallampati: II TM Distance: >3 FB     Dental  (+) Teeth Intact   Pulmonary Current Smoker,  breath sounds clear to auscultation        Cardiovascular hypertension, Pt. on medications Rhythm:Regular Rate:Normal     Neuro/Psych    GI/Hepatic GERD-  Controlled and Medicated,  Endo/Other    Renal/GU      Musculoskeletal   Abdominal   Peds  Hematology   Anesthesia Other Findings   Reproductive/Obstetrics                           Anesthesia Physical Anesthesia Plan  ASA: II  Anesthesia Plan: General   Post-op Pain Management:    Induction: Intravenous  Airway Management Planned: Oral ETT  Additional Equipment:   Intra-op Plan:   Post-operative Plan: Extubation in OR  Informed Consent: I have reviewed the patients History and Physical, chart, labs and discussed the procedure including the risks, benefits and alternatives for the proposed anesthesia with the patient or authorized representative who has indicated his/her understanding and acceptance.     Plan Discussed with:   Anesthesia Plan Comments:         Anesthesia Quick Evaluation

## 2013-05-14 NOTE — H&P (Signed)
Preston Davis is an 42 y.o. male.   Chief Complaint: right shoulder pain HPI: He has had several months of pain in the right shoulder.  MRI shows partial thickness tear of the supraspinatus tendon.  He has had PT, rest, NSAIDs.  He has done exercises at home on his own.  He continues to have pain in the right shoulder. He is not sleeping well as he rolls on the shoulder and it awakens him.  He has no redness.  He has no other joint pain.  Risks and imponderables of the procedure on the right shoulder have been explained to him.  He asked appropriate questions.  He appears to understand.  He will be admitted to observation overnight.  He also has a familiar history of factor 5 deficiency.  He has seen Dr. Mariel Sleet for this.  He has recommended apixaban post operative 2.5 mgm bid for eight weeks.  Patient is aware of this.  Patient also aware he will need physical therapy several weeks post operative.  Past Medical History  Diagnosis Date  . Hypertension   . High blood cholesterol level   . Right shoulder pain   . Acid reflux   . Heart murmur     Past Surgical History  Procedure Laterality Date  . Appendectomy    . Vasectomy    . Bone graft  right great toe      for bunion    Family History  Problem Relation Age of Onset  . Clotting disorder Mother   . Diabetes Paternal Grandmother    Social History:  reports that he has been smoking Cigarettes.  He has been smoking about 1.50 packs per day. He has never used smokeless tobacco. He reports that he does not drink alcohol or use illicit drugs.  Allergies:  Allergies  Allergen Reactions  . Benicar (Olmesartan) Other (See Comments)    REACTION: Unable to regulate blood pressure with this medication--caused BP levels to drop too low.    Medications Prior to Admission  Medication Sig Dispense Refill  . amLODipine (NORVASC) 10 MG tablet Take 10 mg by mouth daily.        . Aspirin-Acetaminophen-Caffeine (GOODYS EXTRA STRENGTH PO) Take  1 Container by mouth daily as needed. For pain       . hydrochlorothiazide 25 MG tablet Take 25 mg by mouth daily.        Marland Kitchen HYDROcodone-acetaminophen (NORCO) 7.5-325 MG per tablet Take 1 tablet by mouth every 6 (six) hours as needed for pain.      . naproxen (NAPROSYN) 500 MG tablet Take 500 mg by mouth 2 (two) times daily with a meal.      . pantoprazole (PROTONIX) 40 MG tablet Take 40 mg by mouth daily.      . sildenafil (VIAGRA) 100 MG tablet Take 100 mg by mouth daily as needed for erectile dysfunction. Takes 1/2 tablet as needed      . simvastatin (ZOCOR) 20 MG tablet Take 20 mg by mouth every evening.        Results for orders placed during the hospital encounter of 05/14/13 (from the past 48 hour(s))  URINALYSIS, ROUTINE W REFLEX MICROSCOPIC     Status: Abnormal   Collection Time    05/14/13  6:11 AM      Result Value Range   Color, Urine YELLOW  YELLOW   APPearance CLEAR  CLEAR   Specific Gravity, Urine 1.010  1.005 - 1.030   pH 7.0  5.0 -  8.0   Glucose, UA NEGATIVE  NEGATIVE mg/dL   Hgb urine dipstick SMALL (*) NEGATIVE   Bilirubin Urine NEGATIVE  NEGATIVE   Ketones, ur NEGATIVE  NEGATIVE mg/dL   Protein, ur NEGATIVE  NEGATIVE mg/dL   Urobilinogen, UA 0.2  0.0 - 1.0 mg/dL   Nitrite NEGATIVE  NEGATIVE   Leukocytes, UA NEGATIVE  NEGATIVE  URINE MICROSCOPIC-ADD ON     Status: None   Collection Time    05/14/13  6:11 AM      Result Value Range   RBC / HPF 7-10  <3 RBC/hpf   No results found.  Review of Systems  Musculoskeletal: Positive for joint pain (Pain of the right shoulder for months.  MRI shows tear partial thickness of supraspinatus tendon.  He has had PT, rest, NSAID without relief.).  Endo/Heme/Allergies:       History of factor 5 deficiency.  Has seen Dr. Mariel Sleet and it is recommended the patient take apixaban 2.5 bid for 8 weeks post operative.  All other systems reviewed and are negative.    Blood pressure 130/90, pulse 70, temperature 97.7 F (36.5 C),  temperature source Oral, resp. rate 11, height 5\' 7"  (1.702 m), weight 79.379 kg (175 lb), SpO2 96.00%. Physical Exam  Constitutional: He is oriented to person, place, and time. He appears well-developed and well-nourished.  HENT:  Head: Normocephalic and atraumatic.  Eyes: Conjunctivae and EOM are normal. Pupils are equal, round, and reactive to light.  Neck: Normal range of motion. Neck supple.  Cardiovascular: Normal rate, regular rhythm, normal heart sounds and intact distal pulses.   Respiratory: Effort normal.  GI: Soft. Bowel sounds are normal.  Musculoskeletal: He exhibits tenderness (Pain right shoulder with more pain in overhead use and full extension, full abduction.  He has slight crepitus.  NV is intact.  Grips are normal.).       Right shoulder: He exhibits decreased range of motion, tenderness and crepitus.       Arms: Neurological: He is alert and oriented to person, place, and time. He has normal reflexes.  Skin: Skin is warm and dry.  Psychiatric: He has a normal mood and affect. His behavior is normal. Judgment and thought content normal.     Assessment/Plan Rotator cuff tear right.  For open repair.  , 05/14/2013, 7:16 AM

## 2013-05-14 NOTE — Brief Op Note (Signed)
05/14/2013  9:00 AM  PATIENT:  Preston Davis  42 y.o. male  PRE-OPERATIVE DIAGNOSIS:  tear right rotator cuff  POST-OPERATIVE DIAGNOSIS:  tear right rotator cuff  PROCEDURE:  Procedure(s): ROTATOR CUFF REPAIR SHOULDER OPEN (Right)  SURGEON:  Surgeon(s) and Role:    * Darreld Mclean, MD - Primary  PHYSICIAN ASSISTANT:   ASSISTANTS: C. Page   ANESTHESIA:   general  EBL:  Total I/O In: 1000 [I.V.:1000] Out: 10 [Blood:10]  BLOOD ADMINISTERED:none  DRAINS: none   LOCAL MEDICATIONS USED:  NONE  SPECIMEN:  Source of Specimen:  Acromino-corocoid ligament, subacrominal bursa  DISPOSITION OF SPECIMEN:  PATHOLOGY  COUNTS:  YES  TOURNIQUET:  * No tourniquets in log *  DICTATION: .Other Dictation: Dictation Number [id number]  PLAN OF CARE: Admit for overnight observation  PATIENT DISPOSITION:  PACU - hemodynamically stable.   Delay start of Pharmacological VTE agent (>24hrs) due to surgical blood loss or risk of bleeding: yes

## 2013-05-14 NOTE — Preoperative (Signed)
Beta Blockers   Reason not to administer Beta Blockers:Not Applicable 

## 2013-05-14 NOTE — Anesthesia Postprocedure Evaluation (Signed)
  Anesthesia Post-op Note  Patient: Preston Davis  Procedure(s) Performed: Procedure(s): ROTATOR CUFF REPAIR SHOULDER OPEN (Right)  Patient Location: PACU  Anesthesia Type:General  Level of Consciousness: awake, alert , oriented and patient cooperative  Airway and Oxygen Therapy: Patient Spontanous Breathing and Patient connected to face mask oxygen  Post-op Pain: mild  Post-op Assessment: Post-op Vital signs reviewed, Patient's Cardiovascular Status Stable, Respiratory Function Stable, RESPIRATORY FUNCTION UNSTABLE, Patent Airway and No signs of Nausea or vomiting  Post-op Vital Signs: Reviewed and stable  Complications: No apparent anesthesia complications

## 2013-05-14 NOTE — Progress Notes (Signed)
The History and Physical is unchanged. I have examined the patient. The patient is medically able to have surgery on the right shoulder . , 

## 2013-05-14 NOTE — Anesthesia Procedure Notes (Addendum)
Performed by: Corena Pilgrim L   Procedure Name: Intubation Date/Time: 05/14/2013 7:38 AM Performed by: Carolyne Littles, AMY L Pre-anesthesia Checklist: Patient identified, Patient being monitored, Timeout performed, Emergency Drugs available and Suction available Patient Re-evaluated:Patient Re-evaluated prior to inductionOxygen Delivery Method: Circle System Utilized Preoxygenation: Pre-oxygenation with 100% oxygen Intubation Type: IV induction Ventilation: Mask ventilation without difficulty Laryngoscope Size: Miller and 3 Grade View: Grade I Tube type: Oral Tube size: 7.0 mm Number of attempts: 1 Airway Equipment and Method: stylet Placement Confirmation: ETT inserted through vocal cords under direct vision,  positive ETCO2 and breath sounds checked- equal and bilateral Secured at: 21 cm Tube secured with: Tape Dental Injury: Teeth and Oropharynx as per pre-operative assessment

## 2013-05-15 MED ORDER — OXYCODONE-ACETAMINOPHEN 7.5-325 MG PO TABS: 1.0000 | ORAL_TABLET | ORAL | Status: DC | PRN

## 2013-05-15 MED ORDER — APIXABAN 2.5 MG PO TABS: 2.5000 mg | ORAL_TABLET | Freq: Two times a day (BID) | ORAL | Status: DC

## 2013-05-15 NOTE — Op Note (Signed)
NAME:  Preston Davis, Preston Davis                 ACCOUNT NO.:  [id number]  MEDICAL RECORD NO.:  [id number]  LOCATION:  A334                          FACILITY:  [facility]  PHYSICIAN:  J. Darreld Mclean, M.D. DATE OF BIRTH:  10-29-1971  DATE OF PROCEDURE: DATE OF DISCHARGE:                              OPERATIVE REPORT   PREOPERATIVE DIAGNOSIS:  Tear of rotator cuff, right shoulder.  POSTOPERATIVE DIAGNOSIS:  Tear of rotator cuff, right shoulder.  PROCEDURE:  Open repair rotator cuff shoulder with Neer acromioplasty and primary repair of a partial tear of the supraspinatus tendon of the right rotator cuff.  ANESTHESIA:  General.  SURGEON:  J. Darreld Mclean, M.D.  ASSISTANT:  Samara Deist Page.  DRAINS:  No drains.  ESTIMATED BLOOD LOSS:  50 mL.  No blood replaced.  INDICATIONS:  The patient has pain and tenderness in his right shoulder now for many months.  MRI shows incomplete tear of the supraspinatus tendon in the right shoulder.  The patient has tried rest, physical therapy, nonsteroidal anti-inflammatory medications.  He has been doing exercises at home daily.  He still continues to have pain and tenderness of the shoulder particularly at night when he rolls over on it with any type of overhead work.  He desires to have surgical repair.  Risks and imponderables has been discussed preoperatively.  He appeared to understand and agreed to the procedure as outlined.  DESCRIPTION OF THE PROCEDURE:  The patient was seen in the holding area, identified the right shoulder as correct surgical site.  I placed a mark on the right shoulder.  He is brought to the operating room and placed supine on the operating room table.  He was given general anesthesia. He was placed in a semi Barbara position on the table.  The arm was fixed so that it could be moved freely by me with the assistant during the procedure.  The patient was then prepped and draped in usual manner.  A generalized time-out identifying  the patient [name], and doing his right shoulder for rotator cuff repair.  All instrumentation was positioned and properly working.  The OR team knew each other.  After being prepped and draped, incision was made between the acromion and the coracoid.  Careful dissection, the deltoid was exposed.  A suture was placed approximately 5 cm below the level of the acromion tip as a marker to avoid any distal nerve structures. The So-called weak area in the deltoid was identified and the deltoid was then opened with a muscle-splitting incision.  The acromiocoracoid ligament was identified, tagged and then cut and removed.  This freed up the shoulder more.  Undersurface of the acromion and the [facility] joint had some slight osteophytes and these were removed and freed this area out with broad-based osteotome and then a power rasp.  Good smooth contour was obtained.  The patient had a tear in the supraspinatus as an incomplete tear, was obvious.  This was explored little better and then repaired using 2-0 Prolene suture in a vertical mattress fashion.  A good repair was obtained.  Range of motion was carried out.  There was no apparent impingement.  Repair was  good. No other pathology seen.  The deltoid incision was closed using a 2-0 chromic suture with buried knots in interrupted figure-of-eight fashion. Subcutaneous tissue reapproximated using 2-0 plain and then a running subcuticular 3-0 nylon.  The patient tolerated the procedure well, will go to recovery in good condition.  He will be admitted to observation tonight for pain control.  Physical therapy will be scheduled later. Healing is doing well.          ______________________________ J. Darreld Mclean, M.D.     JWK/MEDQ  D:  05/14/2013  T:  05/14/2013  Job:  [id number]

## 2013-05-15 NOTE — Discharge Summary (Signed)
Physician Discharge Summary  Patient ID: Preston Davis MRN: [id number] DOB/AGE: Aug 04, 1971 42 y.o.  Admit date: 05/14/2013 Discharge date: 05/15/2013  Admission Diagnoses: Rotator cuff tear right shoulder   Discharge Diagnoses: Rotator cuff tear right shoulder Active Problems:   * No active hospital problems. *   Discharged Condition: good  Hospital Course: He had surgery on the right shoulder for rotator cuff repair.  He was admitted to observation post surgery for pain control.  He has done well.  Neurovascular has remained intact.  His pain has been controlled.  He has used a sling and cryocuff.  He is discharged the morning after surgery in good condition.  He has a factor 5 deficiency.  He was begun on apixaban post operatively and will continue on this for eight weeks post surgery.  He did not receive any medication for anti-coagulation after surgery such as enoxaparin because of this.  Consults: None  Significant Diagnostic Studies: none  Treatments: surgery: right rotator cuff repair by open means  Discharge Exam: Blood pressure 122/96, pulse 63, temperature 98.4 F (36.9 C), temperature source Oral, resp. rate 18, height 5\' 8"  (1.727 m), weight 80.513 kg (177 lb 8 oz), SpO2 92.00%. General appearance: alert and cooperative.  He is alert.  Neurovascular is intact.  Disposition: 01-Home or Self Care  Discharge Orders   Future Appointments Provider Department Dept Phone   07/09/2013 9:00 AM Randall An, MD [facility] [phone]   Future Orders Complete By Expires     Call MD / Call 911  As directed     Comments:      If you experience chest pain or shortness of breath, CALL 911 and be transported to the hospital emergency room.  If you develope a fever above 101 F, pus (white drainage) or increased drainage or redness at the wound, or calf pain, call your surgeon's office.    Constipation Prevention  As directed     Comments:      Drink plenty of  fluids.  Prune juice may be helpful.  You may use a stool softener, such as Colace (over the counter) 100 mg twice a day.  Use MiraLax (over the counter) for constipation as needed.    Diet - low sodium heart healthy  As directed     Discharge instructions  As directed     Comments:      You may change dressing on shoulder as desired.  Keep wound dry.  Use the sling.  Use the ice cryocuff as needed.  Keep appointment with Dr. Hilda Lias.  If any problem, call Dr. Sanjuan Dame office at [phone] or if after hours, the hospital at [phone].    Increase activity slowly as tolerated  As directed         Medication List    STOP taking these medications       HYDROcodone-acetaminophen 7.5-325 MG per tablet  Commonly known as:  NORCO      TAKE these medications       amLODipine 10 MG tablet  Commonly known as:  NORVASC  Take 10 mg by mouth daily.     apixaban 2.5 MG Tabs tablet  Commonly known as:  ELIQUIS  Take 1 tablet (2.5 mg total) by mouth 2 (two) times daily.     GOODYS EXTRA STRENGTH PO  Take 1 Container by mouth daily as needed. For pain     hydrochlorothiazide 25 MG tablet  Commonly known as:  HYDRODIURIL  Take  25 mg by mouth daily.     naproxen 500 MG tablet  Commonly known as:  NAPROSYN  Take 500 mg by mouth 2 (two) times daily with a meal.     oxyCODONE-acetaminophen 7.5-325 MG per tablet  Commonly known as:  PERCOCET  Take 1 tablet by mouth every 4 (four) hours as needed for pain.     pantoprazole 40 MG tablet  Commonly known as:  PROTONIX  Take 40 mg by mouth daily.     sildenafil 100 MG tablet  Commonly known as:  VIAGRA  Take 100 mg by mouth daily as needed for erectile dysfunction. Takes 1/2 tablet as needed     simvastatin 20 MG tablet  Commonly known as:  ZOCOR  Take 20 mg by mouth every evening.           Follow-up Information   Follow up with Darreld Mclean, MD In 2 weeks.   Contact information:   [address] [place]  [address] [phone]       Signed: Darreld Mclean 05/15/2013, 7:42 AM

## 2013-05-15 NOTE — Progress Notes (Signed)
Prescriptions given states understanding of discharge.

## 2013-05-15 NOTE — Progress Notes (Signed)
Subjective: 1 Day Post-Op Procedure(s) (LRB): ROTATOR CUFF REPAIR SHOULDER OPEN (Right) Patient reports pain as 3 on 0-10 scale.    Objective: Vital signs in last 24 hours: Temp:  [97.6 F (36.4 C)-98.4 F (36.9 C)] 98.4 F (36.9 C) (06/11 0426) Pulse Rate:  [63-84] 63 (06/11 0426) Resp:  [11-22] 18 (06/11 0426) BP: (90-142)/(60-96) 122/96 mmHg (06/11 0426) SpO2:  [91 %-99 %] 92 % (06/11 0426) Weight:  [80.513 kg (177 lb 8 oz)] 80.513 kg (177 lb 8 oz) (06/10 1110)  Intake/Output from previous day: 06/10 0701 - 06/11 0700 In: 2038.3 [P.O.:120; I.V.:1918.3] Out: 785 [Urine:775; Blood:10] Intake/Output this shift:    No results found for this basename: HGB,  in the last 72 hours No results found for this basename: WBC, RBC, HCT, PLT,  in the last 72 hours No results found for this basename: NA, K, CL, CO2, BUN, CREATININE, GLUCOSE, CALCIUM,  in the last 72 hours No results found for this basename: LABPT, INR,  in the last 72 hours  Neurologically intact Neurovascular intact Sensation intact distally Intact pulses distally He had a good night.  He will be discharged today  Assessment/Plan: 1 Day Post-Op Procedure(s) (LRB): ROTATOR CUFF REPAIR SHOULDER OPEN (Right) Discharge Home.  , 05/15/2013, 7:35 AM

## 2013-05-15 NOTE — Progress Notes (Signed)
Utilization Review Complete  

## 2013-05-16 ENCOUNTER — Encounter (HOSPITAL_COMMUNITY): Payer: Self-pay | Admitting: Orthopaedic Surgery

## 2013-05-29 ENCOUNTER — Emergency Department (HOSPITAL_COMMUNITY)
Admission: EM | Admit: 2013-05-29 | Discharge: 2013-05-30 | Disposition: A | Discharge status: Home / Self Care | Payer: BC Managed Care – PPO | Attending: Emergency Medicine | Admitting: Emergency Medicine

## 2013-05-29 ENCOUNTER — Encounter (HOSPITAL_COMMUNITY): Payer: Self-pay

## 2013-05-29 DIAGNOSIS — E7800 Pure hypercholesterolemia, unspecified: Secondary | ICD-10-CM | POA: Insufficient documentation

## 2013-05-29 DIAGNOSIS — K219 Gastro-esophageal reflux disease without esophagitis: Secondary | ICD-10-CM | POA: Insufficient documentation

## 2013-05-29 DIAGNOSIS — E876 Hypokalemia: Secondary | ICD-10-CM | POA: Insufficient documentation

## 2013-05-29 DIAGNOSIS — R011 Cardiac murmur, unspecified: Secondary | ICD-10-CM | POA: Insufficient documentation

## 2013-05-29 DIAGNOSIS — R0789 Other chest pain: Secondary | ICD-10-CM | POA: Insufficient documentation

## 2013-05-29 DIAGNOSIS — I10 Essential (primary) hypertension: Secondary | ICD-10-CM | POA: Insufficient documentation

## 2013-05-29 DIAGNOSIS — R7309 Other abnormal glucose: Secondary | ICD-10-CM | POA: Insufficient documentation

## 2013-05-29 DIAGNOSIS — Z79899 Other long term (current) drug therapy: Secondary | ICD-10-CM | POA: Insufficient documentation

## 2013-05-29 DIAGNOSIS — F17200 Nicotine dependence, unspecified, uncomplicated: Secondary | ICD-10-CM | POA: Insufficient documentation

## 2013-05-29 DIAGNOSIS — R739 Hyperglycemia, unspecified: Secondary | ICD-10-CM

## 2013-05-29 HISTORY — DX: Activated protein C resistance: D68.51

## 2013-05-29 NOTE — ED Notes (Signed)
Pt c/o intermittent chest pain that started earlier today, several episodes of same but gone now.  Pt also concerned about "cold feeling"  To left lower leg for couple of hours.

## 2013-05-30 ENCOUNTER — Emergency Department (HOSPITAL_COMMUNITY): Payer: BC Managed Care – PPO

## 2013-05-30 LAB — CBC WITH DIFFERENTIAL/PLATELET
Eosinophils Relative: 1 % (ref 0–5)
Lymphocytes Relative: 20 % (ref 12–46)
Lymphs Abs: 1.9 10*3/uL (ref 0.7–4.0)
MCV: 83.3 fL (ref 78.0–100.0)
Neutro Abs: 6.8 10*3/uL (ref 1.7–7.7)
Neutrophils Relative %: 72 % (ref 43–77)
Platelets: 343 10*3/uL (ref 150–400)
RBC: 4.86 MIL/uL (ref 4.22–5.81)
WBC: 9.6 10*3/uL (ref 4.0–10.5)

## 2013-05-30 LAB — BASIC METABOLIC PANEL
CO2: 27 mEq/L (ref 19–32)
Chloride: 95 mEq/L — ABNORMAL LOW (ref 96–112)
Glucose, Bld: 161 mg/dL — ABNORMAL HIGH (ref 70–99)
Potassium: 2.8 mEq/L — ABNORMAL LOW (ref 3.5–5.1)
Sodium: 136 mEq/L (ref 135–145)

## 2013-05-30 LAB — TROPONIN I: Troponin I: <0.3 ng/mL (ref <0.30)

## 2013-05-30 MED ORDER — POTASSIUM CHLORIDE 10 MEQ/100ML IV SOLN
10.0000 meq | Freq: Once | INTRAVENOUS | Status: AC
  Administered 2013-05-30: 10 meq via INTRAVENOUS
  Filled 2013-05-30: qty 100

## 2013-05-30 MED ORDER — POTASSIUM CHLORIDE CRYS ER 20 MEQ PO TBCR: 20.0000 meq | EXTENDED_RELEASE_TABLET | Freq: Two times a day (BID) | ORAL | Status: DC

## 2013-05-30 MED ORDER — POTASSIUM CHLORIDE CRYS ER 20 MEQ PO TBCR
40.0000 meq | EXTENDED_RELEASE_TABLET | Freq: Once | ORAL | Status: AC
  Administered 2013-05-30: 40 meq via ORAL
  Filled 2013-05-30: qty 2

## 2013-05-30 MED ORDER — IOHEXOL 350 MG/ML SOLN
100.0000 mL | Freq: Once | INTRAVENOUS | Status: AC | PRN
  Administered 2013-05-30: 100 mL via INTRAVENOUS

## 2013-05-30 NOTE — ED Provider Notes (Signed)
History    CSN: [id number] Arrival date & time 05/29/13  2351  First MD Initiated Contact with Patient 05/30/13 0016     Chief Complaint  Patient presents with  . Chest Pain   (Consider location/radiation/quality/duration/timing/severity/associated sxs/prior Treatment) Patient is a 42 y.o. male presenting with chest pain. The history is provided by the patient.  Chest Pain He has been having episodes of chest pain today. He noted onset at about noon of episodes of sharp pain in his mid sternal area without radiation. Pain would only last a few seconds before resolving. There is no associated dyspnea, nausea, diaphoresis. Pain recurred multiple times. It has been 6 hours since his last episode of pain. Nothing made it better nothing made it worse. When present, pain was 9/10. 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 He had missed taking his outbursts for the last 3 days and then resume taking it today. He is also a cigarette smoker with history of hypertension. Past Medical History  Diagnosis Date  . Hypertension   . High blood cholesterol level   . Right shoulder pain   . Acid reflux   . Heart murmur   . Factor 5 Leiden mutation, heterozygous    Past Surgical History  Procedure Laterality Date  . Appendectomy    . Vasectomy    . Bone graft  right great toe      for bunion  . Shoulder open rotator cuff repair Right 05/14/2013    Procedure: ROTATOR CUFF REPAIR SHOULDER OPEN;  Surgeon: Darreld Mclean, MD;  Location: [facility];  Service: Orthopedics;  Laterality: Right;   Family History  Problem Relation Age of Onset  . Clotting disorder Mother   . Diabetes Paternal Grandmother    History  Substance Use Topics  . Smoking status: Current Every Day Smoker -- 1.00 packs/day    Types: Cigarettes  . Smokeless tobacco: Never Used  . Alcohol Use: No    Review of Systems  Cardiovascular:  Positive for chest pain.  All other systems reviewed and are negative.    Allergies  Benicar  Home Medications   Current Outpatient Rx  Name  Route  Sig  Dispense  Refill  . amLODipine (NORVASC) 10 MG tablet   Oral   Take 10 mg by mouth daily.           Marland Kitchen apixaban (ELIQUIS) 2.5 MG TABS tablet   Oral   Take 1 tablet (2.5 mg total) by mouth 2 (two) times daily.   120 tablet   0     Take for eight weeks following surgery.   . Aspirin-Acetaminophen-Caffeine (GOODYS EXTRA STRENGTH PO)   Oral   Take 1 Container by mouth daily as needed. For pain          . hydrochlorothiazide 25 MG tablet   Oral   Take 25 mg by mouth daily.           . naproxen (NAPROSYN) 500 MG tablet   Oral   Take 500 mg by mouth 2 (two) times daily with a meal.         . pantoprazole (PROTONIX) 40 MG tablet   Oral   Take 40 mg by mouth daily.         . sildenafil (VIAGRA) 100 MG tablet   Oral   Take 100 mg by mouth daily as needed for erectile dysfunction.  Takes 1/2 tablet as needed         . simvastatin (ZOCOR) 20 MG tablet   Oral   Take 20 mg by mouth every evening.         . zolpidem (AMBIEN) 10 MG tablet   Oral   Take 10 mg by mouth at bedtime as needed for sleep.         Marland Kitchen oxyCODONE-acetaminophen (PERCOCET) 7.5-325 MG per tablet   Oral   Take 1 tablet by mouth every 4 (four) hours as needed for pain.   80 tablet   0    BP 151/99  Pulse 99  Temp(Src) 98.2 F (36.8 C) (Oral)  Resp 18  Ht 5\' 8"  (1.727 m)  Wt 173 lb (78.472 kg)  BMI 26.31 kg/m2  SpO2 98% Physical Exam  Nursing note and vitals reviewed.  42 year old male, resting comfortably and in no acute distress. Vital signs are significant for hypertension with blood pressure 151/99. Oxygen saturation is 98%, which is normal. Head is normocephalic and atraumatic. PERRLA, EOMI. Oropharynx is clear. Neck is nontender and supple without adenopathy or JVD. Back is nontender and there is no CVA tenderness. Lungs  are clear without rales, wheezes, or rhonchi. Chest is nontender. Heart has regular rate and rhythm without murmur. Abdomen is soft, flat, nontender without masses or hepatosplenomegaly and peristalsis is normoactive. Extremities have no cyanosis or edema. Surgical scar in the right shoulder is healing well without sign of infection. There is marked limitation of range of motion of the right shoulder to pain but all other joints have full range of motion. There is no calf swelling or tenderness. Skin is warm and dry without rash. Neurologic: Mental status is normal, cranial nerves are intact, there are no motor or sensory deficits.  ED Course  Procedures (including critical care time) Results for orders placed during the hospital encounter of 05/29/13  CBC WITH DIFFERENTIAL      Result Value Range   WBC 9.6  4.0 - 10.5 K/uL   RBC 4.86  4.22 - 5.81 MIL/uL   Hemoglobin 13.8  13.0 - 17.0 g/dL   HCT 16.1  09.6 - 04.5 %   MCV 83.3  78.0 - 100.0 fL   MCH 28.4  26.0 - 34.0 pg   MCHC 34.1  30.0 - 36.0 g/dL   RDW 40.9  81.1 - 91.4 %   Platelets 343  150 - 400 K/uL   Neutrophils Relative % 72  43 - 77 %   Neutro Abs 6.8  1.7 - 7.7 K/uL   Lymphocytes Relative 20  12 - 46 %   Lymphs Abs 1.9  0.7 - 4.0 K/uL   Monocytes Relative 7  3 - 12 %   Monocytes Absolute 0.7  0.1 - 1.0 K/uL   Eosinophils Relative 1  0 - 5 %   Eosinophils Absolute 0.1  0.0 - 0.7 K/uL   Basophils Relative 0  0 - 1 %   Basophils Absolute 0.0  0.0 - 0.1 K/uL  BASIC METABOLIC PANEL      Result Value Range   Sodium 136  135 - 145 mEq/L   Potassium 2.8 (*) 3.5 - 5.1 mEq/L   Chloride 95 (*) 96 - 112 mEq/L   CO2 27  19 - 32 mEq/L   Glucose, Bld 161 (*) 70 - 99 mg/dL   BUN 7  6 - 23 mg/dL   Creatinine, Ser 7.82  0.50 - 1.35 mg/dL  Calcium 10.0  8.4 - 10.5 mg/dL   GFR calc non Af Amer >90  >90 mL/min   GFR calc Af Amer >90  >90 mL/min  TROPONIN I      Result Value Range   Troponin I <0.30  <0.30 ng/mL   Ct Angio Chest  W/cm &/or Wo Cm  05/30/2013   *RADIOLOGY REPORT*  Clinical Data: Chest pain  CT ANGIOGRAPHY CHEST  Technique:  Multidetector CT imaging of the chest using the standard protocol during bolus administration of intravenous contrast. Multiplanar reconstructed images including MIPs were obtained and reviewed to evaluate the vascular anatomy.  Contrast: OMNIPAQUE IOHEXOL 350 MG/ML SOLN  Comparison: None.  Findings: Negative for pulmonary embolism.  Thoracic aorta is normal.  Heart appears normal.  The lungs are clear.  Negative for pneumonia.  Negative for pleural effusion.  Negative for mass or adenopathy.  IMPRESSION: Negative   Original Report Authenticated By: Janeece Riggers, M.D.     ECG shows normal sinus rhythm with a rate of 90, no ectopy. Normal axis. Normal P wave. Normal QRS. Normal intervals. Normal ST and T waves. Impression: normal ECG. Compared with ECG of 05/07/2013, no significant changes are seen.   1. Atypical chest pain   2. Hypokalemia   3. Hyperglycemia     MDM  Chest pain which is unlikely to be any serious pathology given the nature of the pain being only present for a couple seconds at a time. However, he is postop and has factor V deficiency and has not been compliant with his anticoagulant. This clearly puts him at risk for pulmonary embolism so you'll be sent for CT angiogram. It has been long enough since his last episode of pain that is single troponin will be sufficient to rule out myocardial injury. Old records are reviewed and he had right rotator cuff surgery on June 11.  CT scan is unremarkable. Blood sugar is mildly elevated and potassium is very low at 2.8. You will be given intravenous and oral potassium. He is reassured regarding the negative CT scan but is encouraged to take his medication as prescribed.  Dione Booze, MD 05/30/13 [phone]

## 2013-07-09 ENCOUNTER — Ambulatory Visit (HOSPITAL_COMMUNITY): Payer: BC Managed Care – PPO

## 2013-07-15 ENCOUNTER — Other Ambulatory Visit (HOSPITAL_COMMUNITY): Payer: Self-pay | Admitting: Physician Assistant

## 2013-07-15 DIAGNOSIS — R609 Edema, unspecified: Secondary | ICD-10-CM

## 2013-07-15 DIAGNOSIS — I8310 Varicose veins of unspecified lower extremity with inflammation: Secondary | ICD-10-CM

## 2013-07-16 ENCOUNTER — Ambulatory Visit (HOSPITAL_COMMUNITY)
Admission: RE | Admit: 2013-07-16 | Discharge: 2013-07-16 | Disposition: A | Discharge status: Home / Self Care | Payer: BC Managed Care – PPO | Source: Ambulatory Visit | Attending: Physician Assistant | Admitting: Physician Assistant

## 2013-07-16 DIAGNOSIS — M79609 Pain in unspecified limb: Secondary | ICD-10-CM | POA: Insufficient documentation

## 2013-07-16 DIAGNOSIS — I8390 Asymptomatic varicose veins of unspecified lower extremity: Secondary | ICD-10-CM | POA: Insufficient documentation

## 2013-07-16 DIAGNOSIS — I8310 Varicose veins of unspecified lower extremity with inflammation: Secondary | ICD-10-CM

## 2013-07-16 DIAGNOSIS — R609 Edema, unspecified: Secondary | ICD-10-CM

## 2013-07-16 DIAGNOSIS — I824Y9 Acute embolism and thrombosis of unspecified deep veins of unspecified proximal lower extremity: Secondary | ICD-10-CM | POA: Insufficient documentation

## 2013-07-19 ENCOUNTER — Encounter (HOSPITAL_COMMUNITY): Payer: Self-pay

## 2013-08-12 ENCOUNTER — Encounter (HOSPITAL_COMMUNITY): Payer: Self-pay

## 2013-08-12 ENCOUNTER — Encounter (HOSPITAL_COMMUNITY): Payer: BC Managed Care – PPO | Attending: Hematology and Oncology

## 2013-08-12 ENCOUNTER — Other Ambulatory Visit (HOSPITAL_COMMUNITY): Payer: Self-pay | Admitting: Internal Medicine

## 2013-08-12 VITALS — BP 131/84 | HR 72 | Temp 98.2°F | Resp 16 | Wt 177.4 lb

## 2013-08-12 DIAGNOSIS — I82402 Acute embolism and thrombosis of unspecified deep veins of left lower extremity: Secondary | ICD-10-CM

## 2013-08-12 DIAGNOSIS — D682 Hereditary deficiency of other clotting factors: Secondary | ICD-10-CM

## 2013-08-12 DIAGNOSIS — I82409 Acute embolism and thrombosis of unspecified deep veins of unspecified lower extremity: Secondary | ICD-10-CM

## 2013-08-12 DIAGNOSIS — I739 Peripheral vascular disease, unspecified: Secondary | ICD-10-CM

## 2013-08-12 NOTE — Patient Instructions (Signed)
[facility] Discharge Instructions  RECOMMENDATIONS MADE BY THE CONSULTANT AND ANY TEST RESULTS WILL BE SENT TO YOUR REFERRING PHYSICIAN.  EXAM FINDINGS BY THE PHYSICIAN TODAY AND SIGNS OR SYMPTOMS TO REPORT TO CLINIC OR PRIMARY PHYSICIAN: Exam findings as discussed by Dr. Sharia Reeve.  SPECIAL INSTRUCTIONS/FOLLOW-UP: 1.  If your pain is no better in 2 weeks, please call for a follow-up appointment. 2.  Dr. Lanny Cramp is contacting Dr. Sherwood Gambler regarding circulation testing in your lower leg. 3.  Continue your efforts at stopping smoking; we can refer you to smoking cessation classes if interested. 4.  Continue taking your Xarelto as directed. 5.  Return to the Cancer Center in 2 months as scheduled - we will discuss length of Xarelto at that time.  Thank you for choosing [facility] to provide your oncology and hematology care.  To afford each patient quality time with our providers, please arrive at least 15 minutes before your scheduled appointment time.  With your help, our goal is to use those 15 minutes to complete the necessary work-up to ensure our physicians have the information they need to help with your evaluation and healthcare recommendations.    Effective January 1st, 2014, we ask that you re-schedule your appointment with our physicians should you arrive 10 or more minutes late for your appointment.  We strive to give you quality time with our providers, and arriving late affects you and other patients whose appointments are after yours.    Again, thank you for choosing [facility].  Our hope is that these requests will decrease the amount of time that you wait before being seen by our physicians.       _____________________________________________________________  Should you have questions after your visit to [facility], please contact our office at [phone] between the hours of 8:30 a.m. and 5:00 p.m.  Voicemails left  after 4:30 p.m. will not be returned until the following business day.  For prescription refill requests, have your pharmacy contact our office with your prescription refill request.

## 2013-08-12 NOTE — Progress Notes (Signed)
[facility] Telephone:([phone]) [phone]   Fax:([phone]  OFFICE PROGRESS NOTE  Cassell Smiles., MD [address] [place] [address]  DIAGNOSIS: History of Factor V Leiden deficiency   INTERVAL HISTORY:   Preston Davis 42 y.o. male with known personal history of Heterozygosity for factor V leiden mutation and also history  in the mother and niece.  He was previously being followed without any personal history of DVT.  Today  He tells me that he was recently diagnosed with a DVT and presently he is on Xarelto. I do see an ultrasound Doppler dated 07/16/2013 which shows isolated subocclusive DVT involving the left profunda femoris vien.  Patient states that he has completed 21 days of 15 mg twice a day  and now on 20 mg daily of Xarelto.  He also tells me that since being on anticoagulation he still feels mild pain around the back of his left knee.  He states that he stopped smoking in June 2014.  He denies any leg swelling , but states that  He previously had leg swelling which  resolved when he was taken off Norvasc. Reportedly he had shoulder surgery in June 2014 and was anticoagulated with Eliquis for 8 weeks post weeks post surgery. He states that he has pain mostly in the left leg when he walks.  MEDICAL HISTORY: Past Medical History  Diagnosis Date  . Hypertension   . High blood cholesterol level   . Right shoulder pain   . Acid reflux   . Heart murmur   . Factor 5 Leiden mutation, heterozygous     ALLERGIES:  is allergic to benicar.  MEDICATIONS:  Current Outpatient Prescriptions  Medication Sig Dispense Refill  . hydrochlorothiazide 25 MG tablet Take 25 mg by mouth daily.        Marland Kitchen lisinopril (PRINIVIL,ZESTRIL) 5 MG tablet Take 5 mg by mouth daily.      . pantoprazole (PROTONIX) 40 MG tablet Take 40 mg by mouth daily.      . Rivaroxaban (XARELTO) 20 MG TABS tablet Take 20 mg by mouth daily.      . sildenafil (VIAGRA) 100 MG  tablet Take 100 mg by mouth daily as needed for erectile dysfunction. Takes 1/2 tablet as needed      . simvastatin (ZOCOR) 20 MG tablet Take 20 mg by mouth every evening.      . zolpidem (AMBIEN) 10 MG tablet Take 10 mg by mouth at bedtime as needed for sleep.      . Aspirin-Acetaminophen-Caffeine (GOODYS EXTRA STRENGTH PO) Take 1 Container by mouth daily as needed. For pain        No current facility-administered medications for this visit.    SURGICAL HISTORY:  Past Surgical History  Procedure Laterality Date  . Appendectomy    . Vasectomy    . Bone graft  right great toe      for bunion  . Shoulder open rotator cuff repair Right 05/14/2013    Procedure: ROTATOR CUFF REPAIR SHOULDER OPEN;  Surgeon: Darreld Mclean, MD;  Location: [facility];  Service: Orthopedics;  Laterality: Right;     REVIEW OF SYSTEMS:14 point review of system is as in the history above otherwise negative.  PHYSICAL EXAMINATION:  Blood pressure 131/84, pulse 72, temperature 98.2 F (36.8 C), temperature source Oral, resp. rate 16, weight 177 lb 6.4 oz (80.468 kg). GENERAL: No acute distress. SKIN:  No rashes or significant lesions . No  ecchymosis or petechial rash. HEAD: Normocephalic, No masses, lesions, tenderness or abnormalities  EYES: Conjunctiva are pink and non-injected and no jaundice LYMPH: No palpable lymphadenopathy, in the neck, supraclavicular areas or axilla. LUNGS: decreased breath sounds bilateral otherwise clear. HEART: regular rate & rhythm, no murmurs, no gallops, S1 normal and S2 normal and no S3. ABDOMEN: Abdomen soft, non-tender, no masses or organomegaly and no hepatosplenomegaly palpable EXTREMITIES: No edema, no skin discoloration or tenderness NEURO: Alert & oriented , no focal motor  deficits.     LABORATORY DATA: Lab Results  Component Value Date   WBC 9.6 05/30/2013   HGB 13.8 05/30/2013   HCT 40.5 05/30/2013   MCV 83.3 05/30/2013   PLT 343 05/30/2013      Chemistry        Component Value Date/Time   NA 136 05/30/2013 0021   K 2.8* 05/30/2013 0021   CL 95* 05/30/2013 0021   CO2 27 05/30/2013 0021   BUN 7 05/30/2013 0021   CREATININE 0.94 05/30/2013 0021      Component Value Date/Time   CALCIUM 10.0 05/30/2013 0021   ALKPHOS 83 05/07/2013 1105   AST 23 05/07/2013 1105   ALT 33 05/07/2013 1105   BILITOT 0.3 05/07/2013 1105       RADIOGRAPHIC STUDIES: US Venous Img Lower Bilateral  07/16/2013   *RADIOLOGY REPORT*  Clinical Data: Bilateral leg edema, varicose veins, left calf pain  VENOUS DUPLEX ULTRASOUND OF BILATERAL LOWER EXTREMITIES  Technique:  Gray-scale sonography with graded compression, as well as color Doppler and duplex ultrasound, were performed to evaluate the deep venous system of both lower extremities from the level of the common femoral vein through the popliteal and proximal calf veins.  Spectral Doppler was utilized to evaluate flow at rest and with distal augmentation maneuvers.  Comparison:  None.  Findings: Right lower extremity:  No deep venous thrombosis in the visualized right lower extremity.  Normal compressibility.  Patent color Doppler flow.  Satisfactory spectral Doppler with respiratory variation and response to augmentation.  The greater saphenous vein, where visualized, is patent and compressible.  Subcutaneous edema in the ankle.  Left lower extremity:  Subocclusive thrombus within a noncompressible profunda femoris vein.  The common femoral vein, superficial femoral vein, and popliteal veins remain patent.  The greater saphenous vein, where visualized, is patent and compressible.  IMPRESSION: Isolated subocclusive deep venous thrombosis within the left profunda femoris vein. The left common femoral, superficial femoral, and popliteal veins remain patent.  No deep venous thrombosis in the visualized right lower extremity.  This was made a call report.   Original Report Authenticated By: Charline Bills, M.D.     ASSESSMENT:  Mr. Currey has  small left lower extremity. He doing well  On Xarelto and there is a no clinical evidence of suggest worsening DVT . He has heterozygosity for factor V leiden mutation which is not by itself a highly thrombogenic risk factor. Etiology of his leg pain is unclear but does not seem to be related to a DVT given lack of edema, tenderness or erythema.  I feel that given his smoking history patient could have intermittent  Claudication.  PLAN:  1. Will recommend 3-6 months of anticoagulation. There is no indication for lifelong or long term anticoagulation in the absence of recurrence. 2. Patient's leg pain is unclear in terms of etiology, given his smoking history vasculopathy could be a potential etiology. 3. I asked him to call in 2 weeks if his leg pain  is now better. 4. We'll plan on seeing him again in 2 months. 5. ABI  may not be a unreasonable for the the leg pain.  I spoke to Dr. Sherwood Gambler regarding these recommendations.    All questions were satisfactorily answered. Patient knows to call if  any concern arises.  I spent more than 50 % counseling the patient face to face. The total time spent in the appointment was 30 minutes.   Sherral Hammers, MD FACP. Hematology/Oncology.

## 2013-08-15 ENCOUNTER — Ambulatory Visit (HOSPITAL_COMMUNITY)
Admission: RE | Admit: 2013-08-15 | Discharge: 2013-08-15 | Disposition: A | Discharge status: Home / Self Care | Payer: BC Managed Care – PPO | Source: Ambulatory Visit | Attending: Internal Medicine | Admitting: Internal Medicine

## 2013-08-15 DIAGNOSIS — I739 Peripheral vascular disease, unspecified: Secondary | ICD-10-CM | POA: Insufficient documentation

## 2013-10-15 ENCOUNTER — Encounter (HOSPITAL_COMMUNITY): Payer: Self-pay

## 2013-10-15 ENCOUNTER — Encounter (HOSPITAL_COMMUNITY): Payer: BC Managed Care – PPO | Attending: Hematology and Oncology

## 2013-10-15 VITALS — BP 143/94 | HR 69 | Temp 98.3°F | Resp 18 | Wt 176.2 lb

## 2013-10-15 DIAGNOSIS — I82402 Acute embolism and thrombosis of unspecified deep veins of left lower extremity: Secondary | ICD-10-CM

## 2013-10-15 DIAGNOSIS — I82409 Acute embolism and thrombosis of unspecified deep veins of unspecified lower extremity: Secondary | ICD-10-CM

## 2013-10-15 NOTE — Progress Notes (Signed)
[facility]  OFFICE PROGRESS NOTE  Cassell Smiles., MD [address] [place] [address]  DIAGNOSIS: DVT (deep venous thrombosis), left - Plan: Compression stockings, US Venous Img Lower Unilateral Left, CBC with Differential, Comprehensive metabolic panel  Chief Complaint: F/u for DVT  CURRENT THERAPY: Xarelto  INTERVAL HISTORY: Preston Davis 42 y.o. male with known personal and family history of Heterozygosity for factor V leiden  mutation was diagnosed with left lower extremity DVT dated 07/16/2013 which shows isolated subocclusive DVT involving the left profunda femoris vien. He Is at  present on 20 mg of Xarelto once daily He complains of intermittent dull ache in his left lower extremity which is not new for him. His arterial Doppler was negative for any arterial insufficiency or thrombosis.   He denies any headaches, dizziness, double vision, fevers, chills, night sweats, nausea, vomiting, diarrhea, constipation, chest pain, heart palpitations, shortness of breath, blood in stool, black tarry stool, urinary pain, urinary burning, urinary frequency, hematuria.  MEDICAL HISTORY: Past Medical History  Diagnosis Date  . Hypertension   . High blood cholesterol level   . Right shoulder pain   . Acid reflux   . Heart murmur   . Factor 5 Leiden mutation, heterozygous   . DVT (deep venous thrombosis) 2014    left leg    INTERIM HISTORY:  does not have a problem list on file.    ALLERGIES:  is allergic to benicar.  MEDICATIONS:  Current Outpatient Prescriptions  Medication Sig Dispense Refill  . Aspirin-Acetaminophen-Caffeine (GOODYS EXTRA STRENGTH PO) Take 1 Container by mouth daily as needed. For pain       . hydrochlorothiazide 25 MG tablet Take 25 mg by mouth daily.        Marland Kitchen lisinopril (PRINIVIL,ZESTRIL) 5 MG tablet Take 5 mg by mouth daily.      . pantoprazole (PROTONIX) 40 MG tablet Take 40 mg by mouth daily.       . Rivaroxaban (XARELTO) 20 MG TABS tablet Take 20 mg by mouth daily.      . sildenafil (VIAGRA) 100 MG tablet Take 100 mg by mouth daily as needed for erectile dysfunction. Takes 1/2 tablet as needed      . simvastatin (ZOCOR) 20 MG tablet Take 20 mg by mouth every evening.      . zolpidem (AMBIEN) 10 MG tablet Take 10 mg by mouth at bedtime as needed for sleep.       No current facility-administered medications for this visit.    SURGICAL HISTORY:  Past Surgical History  Procedure Laterality Date  . Appendectomy    . Vasectomy    . Bone graft  right great toe      for bunion  . Shoulder open rotator cuff repair Right 05/14/2013    Procedure: ROTATOR CUFF REPAIR SHOULDER OPEN;  Surgeon: Darreld Mclean, MD;  Location: [facility];  Service: Orthopedics;  Laterality: Right;    FAMILY HISTORY: family history includes Clotting disorder in his mother; Diabetes in his paternal grandmother.  SOCIAL HISTORY:  reports that he quit smoking about 4 months ago. His smoking use included Cigarettes. He smoked 1.00 pack per day. He has never used smokeless tobacco. He reports that he does not drink alcohol or use illicit drugs.  REVIEW OF SYSTEMS:  As mentioned in interval history PHYSICAL EXAMINATION: ECOG PERFORMANCE STATUS: 0 - Asymptomatic  Blood pressure 143/94, pulse 69, temperature 98.3 F (36.8 C), resp. rate  18, weight 176 lb 3.2 oz (79.924 kg).  GENERAL:alert, no distress, well nourished and well developed SKIN: no rashes or significant lesions HEAD: Normocephalic EYES: PERRLA, EOMI, Conjunctiva are pink and non-injected, sclera clear EARS: External ears normal OROPHARYNX:no erythema, lips, buccal mucosa, and tongue normal and mucous membranes are moist  NECK: supple, no adenopathy, no JVD, no stridor, non-tender LYMPH:  no palpable lymphadenopathy, no hepatosplenomegaly BREAST:breasts appear normal, no suspicious masses, no skin or nipple changes or axillary nodes LUNGS: clear to  auscultation , coarse sounds heard HEART: regular rate & rhythm ABDOMEN:abdomen soft, obese and normal bowel sounds BACK: Back symmetric, no curvature. EXTREMITIES:no edema, no clubbing and no cyanosis  NEURO: alert & oriented x 3 with fluent speech, no focal motor/sensory deficits, gait normal   LABORATORY DATA: No visits with results within 30 Day(s) from this visit. Latest known visit with results is:  Admission on 05/29/2013, Discharged on 05/30/2013  Component Date Value Range Status  . WBC 05/30/2013 9.6  4.0 - 10.5 K/uL Final  . RBC 05/30/2013 4.86  4.22 - 5.81 MIL/uL Final  . Hemoglobin 05/30/2013 13.8  13.0 - 17.0 g/dL Final  . HCT 16/09/9603 40.5  39.0 - 52.0 % Final  . MCV 05/30/2013 83.3  78.0 - 100.0 fL Final  . MCH 05/30/2013 28.4  26.0 - 34.0 pg Final  . MCHC 05/30/2013 34.1  30.0 - 36.0 g/dL Final  . RDW 54/08/8118 12.7  11.5 - 15.5 % Final  . Platelets 05/30/2013 343  150 - 400 K/uL Final  . Neutrophils Relative % 05/30/2013 72  43 - 77 % Final  . Neutro Abs 05/30/2013 6.8  1.7 - 7.7 K/uL Final  . Lymphocytes Relative 05/30/2013 20  12 - 46 % Final  . Lymphs Abs 05/30/2013 1.9  0.7 - 4.0 K/uL Final  . Monocytes Relative 05/30/2013 7  3 - 12 % Final  . Monocytes Absolute 05/30/2013 0.7  0.1 - 1.0 K/uL Final  . Eosinophils Relative 05/30/2013 1  0 - 5 % Final  . Eosinophils Absolute 05/30/2013 0.1  0.0 - 0.7 K/uL Final  . Basophils Relative 05/30/2013 0  0 - 1 % Final  . Basophils Absolute 05/30/2013 0.0  0.0 - 0.1 K/uL Final  . Sodium 05/30/2013 136  135 - 145 mEq/L Final  . Potassium 05/30/2013 2.8* 3.5 - 5.1 mEq/L Final  . Chloride 05/30/2013 95* 96 - 112 mEq/L Final  . CO2 05/30/2013 27  19 - 32 mEq/L Final  . Glucose, Bld 05/30/2013 161* 70 - 99 mg/dL Final  . BUN 14/78/2956 7  6 - 23 mg/dL Final  . Creatinine, Ser 05/30/2013 0.94  0.50 - 1.35 mg/dL Final  . Calcium 21/30/8657 10.0  8.4 - 10.5 mg/dL Final  . GFR calc non Af Amer 05/30/2013 >90  >90 mL/min  Final  . GFR calc Af Amer 05/30/2013 >90  >90 mL/min Final   Comment:                                 The eGFR has been calculated                          using the CKD EPI equation.                          This calculation has not been  validated in all clinical                          situations.                          eGFR's persistently                          <90 mL/min signify                          possible Chronic Kidney Disease.  . Troponin I 05/30/2013 <0.30  <0.30 ng/mL Final   Comment:                                 Due to the release kinetics of cTnI,                          a negative result within the first hours                          of the onset of symptoms does not rule out                          myocardial infarction with certainty.                          If myocardial infarction is still suspected,                          repeat the test at appropriate intervals.    Urinalysis    Component Value Date/Time   COLORURINE YELLOW 05/14/2013 0611   APPEARANCEUR CLEAR 05/14/2013 0611   LABSPEC 1.010 05/14/2013 0611   PHURINE 7.0 05/14/2013 0611   GLUCOSEU NEGATIVE 05/14/2013 0611   HGBUR SMALL* 05/14/2013 0611   BILIRUBINUR NEGATIVE 05/14/2013 0611   KETONESUR NEGATIVE 05/14/2013 0611   PROTEINUR NEGATIVE 05/14/2013 0611   UROBILINOGEN 0.2 05/14/2013 0611   NITRITE NEGATIVE 05/14/2013 0611   LEUKOCYTESUR NEGATIVE 05/14/2013 4098    ASSESSMENT:  Left lower extremity DVT in a patient with heterozygous factor V mutation: Continue Xarelto for now. Repeat Venous Doppler of the left lower extremity in 3 months prior to next visit. We will reevaluate the need of  continuation of xarelto in 3 months after review of repeat venous Doppler report. I have instructed the patient to get medium compression type high stockings. We'll also order a CMP and CBC prior to next visit. I reiterated the warning signs of bleeding on xarelto and if any  evidence of major bleeding, I have instructed the patient to go to the nearest ER.   PLAN:  F/u in 3 months LLE venous doppler prior to next visit CBC and CMP prior to next visit   All questions were answered. The patient knows to call the clinic with any problems, questions or concerns. We can certainly see the patient much sooner if necessary.   I spent 15 minutes counseling the patient face to face. The total time spent in the appointment was   Annamarie Dawley, MD 10/15/2013 9:22 PM

## 2013-10-15 NOTE — Patient Instructions (Signed)
.  [facility] Discharge Instructions  RECOMMENDATIONS MADE BY THE CONSULTANT AND ANY TEST RESULTS WILL BE SENT TO YOUR REFERRING PHYSICIAN.  EXAM FINDINGS BY THE PHYSICIAN TODAY AND SIGNS OR SYMPTOMS TO REPORT TO CLINIC OR PRIMARY PHYSICIAN: Exam and findings as discussed by Dr. Lajuana Ripple.  Continue xarelto  INSTRUCTIONS/FOLLOW-UP: Thigh high compression stocking  Doppler of left lower extremity in 3 months before your visit.  Thank you for choosing [facility] to provide your oncology and hematology care.  To afford each patient quality time with our providers, please arrive at least 15 minutes before your scheduled appointment time.  With your help, our goal is to use those 15 minutes to complete the necessary work-up to ensure our physicians have the information they need to help with your evaluation and healthcare recommendations.    Effective January 1st, 2014, we ask that you re-schedule your appointment with our physicians should you arrive 10 or more minutes late for your appointment.  We strive to give you quality time with our providers, and arriving late affects you and other patients whose appointments are after yours.    Again, thank you for choosing [facility].  Our hope is that these requests will decrease the amount of time that you wait before being seen by our physicians.       _____________________________________________________________  Should you have questions after your visit to [facility], please contact our office at [phone] between the hours of 8:30 a.m. and 5:00 p.m.  Voicemails left after 4:30 p.m. will not be returned until the following business day.  For prescription refill requests, have your pharmacy contact our office with your prescription refill request.

## 2014-01-13 ENCOUNTER — Ambulatory Visit (HOSPITAL_COMMUNITY)
Admission: RE | Admit: 2014-01-13 | Discharge: 2014-01-13 | Disposition: A | Discharge status: Home / Self Care | Payer: BC Managed Care – PPO | Source: Ambulatory Visit | Attending: Hematology and Oncology | Admitting: Hematology and Oncology

## 2014-01-13 DIAGNOSIS — Z09 Encounter for follow-up examination after completed treatment for conditions other than malignant neoplasm: Secondary | ICD-10-CM | POA: Insufficient documentation

## 2014-01-13 DIAGNOSIS — I82890 Acute embolism and thrombosis of other specified veins: Secondary | ICD-10-CM | POA: Insufficient documentation

## 2014-01-13 DIAGNOSIS — I82402 Acute embolism and thrombosis of unspecified deep veins of left lower extremity: Secondary | ICD-10-CM

## 2014-01-15 ENCOUNTER — Encounter (HOSPITAL_COMMUNITY): Payer: Self-pay

## 2014-01-15 ENCOUNTER — Encounter (HOSPITAL_BASED_OUTPATIENT_CLINIC_OR_DEPARTMENT_OTHER): Payer: BC Managed Care – PPO

## 2014-01-15 ENCOUNTER — Encounter (HOSPITAL_COMMUNITY): Payer: BC Managed Care – PPO | Attending: Hematology and Oncology

## 2014-01-15 VITALS — BP 137/90 | HR 82 | Temp 97.8°F | Resp 16 | Wt 174.5 lb

## 2014-01-15 DIAGNOSIS — I82409 Acute embolism and thrombosis of unspecified deep veins of unspecified lower extremity: Secondary | ICD-10-CM | POA: Insufficient documentation

## 2014-01-15 DIAGNOSIS — I824Z9 Acute embolism and thrombosis of unspecified deep veins of unspecified distal lower extremity: Secondary | ICD-10-CM

## 2014-01-15 DIAGNOSIS — I824Z2 Acute embolism and thrombosis of unspecified deep veins of left distal lower extremity: Secondary | ICD-10-CM

## 2014-01-15 DIAGNOSIS — I82402 Acute embolism and thrombosis of unspecified deep veins of left lower extremity: Secondary | ICD-10-CM

## 2014-01-15 DIAGNOSIS — D682 Hereditary deficiency of other clotting factors: Secondary | ICD-10-CM

## 2014-01-15 LAB — CBC WITH DIFFERENTIAL/PLATELET
BASOS ABS: 0 10*3/uL (ref 0.0–0.1)
BASOS PCT: 1 % (ref 0–1)
EOS ABS: 0.1 10*3/uL (ref 0.0–0.7)
Eosinophils Relative: 2 % (ref 0–5)
HCT: 41.2 % (ref 39.0–52.0)
Hemoglobin: 13.2 g/dL (ref 13.0–17.0)
Lymphocytes Relative: 34 % (ref 12–46)
Lymphs Abs: 1.5 10*3/uL (ref 0.7–4.0)
MCH: 27.7 pg (ref 26.0–34.0)
MCHC: 32 g/dL (ref 30.0–36.0)
MCV: 86.4 fL (ref 78.0–100.0)
MONOS PCT: 8 % (ref 3–12)
Monocytes Absolute: 0.4 10*3/uL (ref 0.1–1.0)
NEUTROS PCT: 56 % (ref 43–77)
Neutro Abs: 2.5 10*3/uL (ref 1.7–7.7)
PLATELETS: 243 10*3/uL (ref 150–400)
RBC: 4.77 MIL/uL (ref 4.22–5.81)
RDW: 13.2 % (ref 11.5–15.5)
WBC: 4.4 10*3/uL (ref 4.0–10.5)

## 2014-01-15 LAB — COMPREHENSIVE METABOLIC PANEL
ALBUMIN: 4 g/dL (ref 3.5–5.2)
ALK PHOS: 82 U/L (ref 39–117)
ALT: 30 U/L (ref 0–53)
AST: 21 U/L (ref 0–37)
BUN: 8 mg/dL (ref 6–23)
CO2: 26 mEq/L (ref 19–32)
Calcium: 9.3 mg/dL (ref 8.4–10.5)
Chloride: 103 mEq/L (ref 96–112)
Creatinine, Ser: 1.07 mg/dL (ref 0.50–1.35)
GFR calc Af Amer: >90 mL/min (ref >90)
GFR calc non Af Amer: 83 mL/min — ABNORMAL LOW (ref >90)
Glucose, Bld: 145 mg/dL — ABNORMAL HIGH (ref 70–99)
POTASSIUM: 4.5 meq/L (ref 3.7–5.3)
SODIUM: 141 meq/L (ref 137–147)
TOTAL PROTEIN: 7.5 g/dL (ref 6.0–8.3)
Total Bilirubin: 0.3 mg/dL (ref 0.3–1.2)

## 2014-01-15 LAB — D-DIMER, QUANTITATIVE (NOT AT ARMC)

## 2014-01-15 MED ORDER — RIVAROXABAN 20 MG PO TABS: ORAL_TABLET | ORAL | Status: DC

## 2014-01-15 MED ORDER — RIVAROXABAN 15 MG PO TABS: ORAL_TABLET | ORAL | Status: DC

## 2014-01-15 NOTE — Progress Notes (Signed)
[place]  OFFICE PROGRESS NOTE  Glo Herring., MD [address] [place] [address]  DIAGNOSIS: Factor V deficiency - Plan: D-dimer, quantitative  Acute deep vein thrombosis (DVT) of distal vein of left lower extremity  Chief Complaint  Patient presents with  . Hypercoagulability    Xarelto therapy    CURRENT THERAPY: Xarelto 20 mg daily stopped 3 weeks ago.  INTERVAL HISTORY: Preston Davis 43 y.o. male returns for followup of left lower extremity deep venous thrombosis diagnosed on 07/16/2013 showing isolated subocclusive thrombus involving the left profunda femoris veins taking Xarelto 20 mg daily. He recently underwent repeat ultrasound examination of the lower extremity. He is heterozygous for factor  5 Leiden.  He's had chronic pain in left lower extremity but no further swelling. He denies any chest pain, PND, orthopnea, palpitations, nausea, vomiting, cough, wheezing, chest pain, shortness of breath on exertion, melena, hematochezia, hematuria, headache, or seizures. He was told 3 weeks ago that his Xarelto was not refillable so he has been off the drug for 3 weeks. No attempts were made to contact his office to refill the prescription!   MEDICAL HISTORY: Past Medical History  Diagnosis Date  . Hypertension   . High blood cholesterol level   . Right shoulder pain   . Acid reflux   . Heart murmur   . Factor 5 Leiden mutation, heterozygous   . DVT (deep venous thrombosis) 2014    left leg    INTERIM HISTORY:  does not have a problem list on file.    ALLERGIES:  is allergic to benicar.  MEDICATIONS: has a current medication list which includes the following prescription(s): aspirin-acetaminophen-caffeine, lisinopril, pantoprazole, sildenafil, simvastatin, zolpidem, hydrochlorothiazide, rivaroxaban, and rivaroxaban.  SURGICAL HISTORY:  Past Surgical History  Procedure Laterality Date  .  Appendectomy    . Vasectomy    . Bone graft  right great toe      for bunion  . Shoulder open rotator cuff repair Right 05/14/2013    Procedure: ROTATOR CUFF REPAIR SHOULDER OPEN;  Surgeon: Sanjuana Kava, MD;  Location: [facility];  Service: Orthopedics;  Laterality: Right;    FAMILY HISTORY: family history includes Clotting disorder in his mother; Diabetes in his paternal grandmother.  SOCIAL HISTORY:  reports that he quit smoking about 7 months ago. His smoking use included Cigarettes. He smoked 1.00 pack per day. He has never used smokeless tobacco. He reports that he does not drink alcohol or use illicit drugs.  REVIEW OF SYSTEMS:  Other than that discussed above is noncontributory.  PHYSICAL EXAMINATION: ECOG PERFORMANCE STATUS: 1 - Symptomatic but completely ambulatory  Blood pressure 137/90, pulse 82, temperature 97.8 F (36.6 C), temperature source Oral, resp. rate 16, weight 174 lb 8 oz (79.153 kg).  GENERAL:alert, no distress and comfortable SKIN: skin color, texture, turgor are normal, no rashes or significant lesions EYES: PERLA; Conjunctiva are pink and non-injected, sclera clear OROPHARYNX:no exudate, no erythema on lips, buccal mucosa, or tongue. NECK: supple, thyroid normal size, non-tender, without nodularity. No masses CHEST: Slightly increased AP diameter with no gynecomastia. LYMPH:  no palpable lymphadenopathy in the cervical, axillary or inguinal LUNGS: clear to auscultation and percussion with normal breathing effort HEART: regular rate & rhythm and no murmurs. P2 is tolerable at the apex. ABDOMEN:abdomen soft, non-tender and normal bowel sounds MUSCULOSKELETAL:no cyanosis of digits and no clubbing. Range of motion normal.  NEURO: alert & oriented x  3 with fluent speech, no focal motor/sensory deficits   LABORATORY DATA: Infusion on 01/15/2014  Component Date Value Ref Range Status  . WBC 01/15/2014 4.4  4.0 - 10.5 K/uL Final  . RBC 01/15/2014 4.77  4.22 - 5.81  MIL/uL Final  . Hemoglobin 01/15/2014 13.2  13.0 - 17.0 g/dL Final  . HCT 01/15/2014 41.2  39.0 - 52.0 % Final  . MCV 01/15/2014 86.4  78.0 - 100.0 fL Final  . MCH 01/15/2014 27.7  26.0 - 34.0 pg Final  . MCHC 01/15/2014 32.0  30.0 - 36.0 g/dL Final  . RDW 01/15/2014 13.2  11.5 - 15.5 % Final  . Platelets 01/15/2014 243  150 - 400 K/uL Final  . Neutrophils Relative % 01/15/2014 56  43 - 77 % Final  . Neutro Abs 01/15/2014 2.5  1.7 - 7.7 K/uL Final  . Lymphocytes Relative 01/15/2014 34  12 - 46 % Final  . Lymphs Abs 01/15/2014 1.5  0.7 - 4.0 K/uL Final  . Monocytes Relative 01/15/2014 8  3 - 12 % Final  . Monocytes Absolute 01/15/2014 0.4  0.1 - 1.0 K/uL Final  . Eosinophils Relative 01/15/2014 2  0 - 5 % Final  . Eosinophils Absolute 01/15/2014 0.1  0.0 - 0.7 K/uL Final  . Basophils Relative 01/15/2014 1  0 - 1 % Final  . Basophils Absolute 01/15/2014 0.0  0.0 - 0.1 K/uL Final  . Sodium 01/15/2014 141  137 - 147 mEq/L Final  . Potassium 01/15/2014 4.5  3.7 - 5.3 mEq/L Final  . Chloride 01/15/2014 103  96 - 112 mEq/L Final  . CO2 01/15/2014 26  19 - 32 mEq/L Final  . Glucose, Bld 01/15/2014 145* 70 - 99 mg/dL Final  . BUN 01/15/2014 8  6 - 23 mg/dL Final  . Creatinine, Ser 01/15/2014 1.07  0.50 - 1.35 mg/dL Final  . Calcium 01/15/2014 9.3  8.4 - 10.5 mg/dL Final  . Total Protein 01/15/2014 7.5  6.0 - 8.3 g/dL Final  . Albumin 01/15/2014 4.0  3.5 - 5.2 g/dL Final  . AST 01/15/2014 21  0 - 37 U/L Final  . ALT 01/15/2014 30  0 - 53 U/L Final  . Alkaline Phosphatase 01/15/2014 82  39 - 117 U/L Final  . Total Bilirubin 01/15/2014 0.3  0.3 - 1.2 mg/dL Final  . GFR calc non Af Amer 01/15/2014 83* >90 mL/min Final  . GFR calc Af Amer 01/15/2014 >90  >90 mL/min Final   Comment: (NOTE)                          The eGFR has been calculated using the CKD EPI equation.                          This calculation has not been validated in all clinical situations.                          eGFR's  persistently <90 mL/min signify possible Chronic Kidney                          Disease.  Marland Kitchen D-Dimer, Quant 01/15/2014 <0.27  0.00 - 0.48 ug/mL-FEU Final   Comment:  AT THE INHOUSE ESTABLISHED CUTOFF                          VALUE OF 0.48 ug/mL FEU,                          THIS ASSAY HAS BEEN DOCUMENTED                          IN THE LITERATURE TO HAVE                          A SENSITIVITY AND NEGATIVE                          PREDICTIVE VALUE OF AT LEAST                          98 TO 99%.  THE TEST RESULT                          SHOULD BE CORRELATED WITH                          AN ASSESSMENT OF THE CLINICAL                          PROBABILITY OF DVT / VTE.    PATHOLOGY: No new pathology.  Urinalysis    Component Value Date/Time   COLORURINE YELLOW 05/14/2013 0611   APPEARANCEUR CLEAR 05/14/2013 0611   LABSPEC 1.010 05/14/2013 0611   PHURINE 7.0 05/14/2013 0611   GLUCOSEU NEGATIVE 05/14/2013 0611   HGBUR SMALL* 05/14/2013 0611   BILIRUBINUR NEGATIVE 05/14/2013 0611   KETONESUR NEGATIVE 05/14/2013 0611   PROTEINUR NEGATIVE 05/14/2013 0611   UROBILINOGEN 0.2 05/14/2013 0611   NITRITE NEGATIVE 05/14/2013 0611   LEUKOCYTESUR NEGATIVE 05/14/2013 0611    RADIOGRAPHIC STUDIES: US Venous Img Lower Unilateral Left  01/13/2014   CLINICAL DATA:  Followup left lower extremity DVT.  EXAM: LEFT LOWER EXTREMITY VENOUS DOPPLER ULTRASOUND  TECHNIQUE: Gray-scale sonography with graded compression, as well as color Doppler and duplex ultrasound, were performed to evaluate the deep venous system from the level of the common femoral vein through the popliteal and proximal calf veins. Spectral Doppler was utilized to evaluate flow at rest and with distal augmentation maneuvers.  COMPARISON:  US VENOUS IMG LOWER  BILATERAL dated 07/16/2013  FINDINGS: Thrombus within deep veins:  No further DVT is visualized.  Compressibility of deep veins:  Normal.  Duplex waveform respiratory  phasicity:  Normal.  Duplex waveform response to augmentation:  Normal.  Venous reflux:  None visualized.  Other findings: There is some nonocclusive thrombus identified in the great saphenous vein at the knee and extending into the proximal calf which may relate to prior superficial thrombophlebitis.  IMPRESSION: No evidence of left lower extremity DVT. Nonocclusive thrombus in the GSV at the level of the knee and proximal calf may relate to prior superficial thrombophlebitis.   Electronically Signed   By: Aletta Edouard M.D.   On: 01/13/2014 11:20    ASSESSMENT:  #1. Resolution of left lower extremity femoral vein thrombosis with residual greater saphenous vein change from previous superficial phlebitis. #2. Noncompliance with Xarelto  therapy, frightened by television ads. #3. 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   PLAN:  #1. The patient was counseled and told that in fact it could be a bleeding diathesis that can be produced by the initiation of anticoagulation. This can occur with any anticoagulant. It is my opinion that use of this agent is safer because they're less fluctuations in the degree of anticoagulation versus other medications such as warfarin. Also there are no food or medicinal contraindications nor need for alteration in dosage using this agent versus warfarin. The patient expressed an understanding of this particular shortcoming. #2. Prescription for Xarelto 15 mg twice a day for [redacted] weeks along with 20 mg daily following the first 3 week treatment was given to the patient. #3. Followup in 3 months with CBC and d-dimer. He was warned that stopping Xarelto abruptly could result in a rebound phenomenon producing a recurrent clot.   All questions were answered. The patient knows to call the clinic with any problems, questions or concerns. We can certainly see the patient much sooner if necessary.   I spent 25 minutes counseling the patient face to face. The total  time spent in the appointment was 30 minutes.    Doroteo Bradford, MD 01/15/2014 11:30 AM

## 2014-01-15 NOTE — Progress Notes (Addendum)
Labs drawn today for dimer,cbc/diff,cmp

## 2014-01-15 NOTE — Patient Instructions (Signed)
[facility] Discharge Instructions  RECOMMENDATIONS MADE BY THE CONSULTANT AND ANY TEST RESULTS WILL BE SENT TO YOUR REFERRING PHYSICIAN.  EXAM FINDINGS BY THE PHYSICIAN TODAY AND SIGNS OR SYMPTOMS TO REPORT TO CLINIC OR PRIMARY PHYSICIAN: Exam and findings as discussed by Dr. Zigmund Daniel.  MEDICATIONS PRESCRIBED:  Xarelto - will need to restart at 15 mg twice daily for 3 weeks then 20 mg daily  INSTRUCTIONS/FOLLOW-UP: Follow-up in 3 months with lab work and MD visit.  Thank you for choosing [facility] to provide your oncology and hematology care.  To afford each patient quality time with our providers, please arrive at least 15 minutes before your scheduled appointment time.  With your help, our goal is to use those 15 minutes to complete the necessary work-up to ensure our physicians have the information they need to help with your evaluation and healthcare recommendations.    Effective January 1st, 2014, we ask that you re-schedule your appointment with our physicians should you arrive 10 or more minutes late for your appointment.  We strive to give you quality time with our providers, and arriving late affects you and other patients whose appointments are after yours.    Again, thank you for choosing [facility].  Our hope is that these requests will decrease the amount of time that you wait before being seen by our physicians.       _____________________________________________________________  Should you have questions after your visit to [facility], please contact our office at [phone] between the hours of 8:30 a.m. and 5:00 p.m.  Voicemails left after 4:30 p.m. will not be returned until the following business day.  For prescription refill requests, have your pharmacy contact our office with your prescription refill request.     Rivaroxaban oral tablets What is this medicine? RIVAROXABAN (ri va ROX a ban) is an  anticoagulant (blood thinner). It is used to treat blood clots in the lungs or in the veins. It is also used after knee or hip surgeries to prevent blood clots. It is also used to lower the chance of stroke in people with a medical condition called atrial fibrillation. This medicine may be used for other purposes; ask your health care provider or pharmacist if you have questions. COMMON BRAND NAME(S): Xarelto What should I tell my health care provider before I take this medicine? They need to know if you have any of these conditions: -bleeding disorders -bleeding in the brain -blood in your stools (black or tarry stools) or if you have blood in your vomit -history of stomach bleeding -kidney disease -liver disease -low blood counts, like low white cell, platelet, or red cell counts -recent or planned spinal or epidural procedure -take medicines that treat or prevent blood clots -an unusual or allergic reaction to rivaroxaban, other medicines, foods, dyes, or preservatives -pregnant or trying to get pregnant -breast-feeding How should I use this medicine? Take this medicine by mouth with a glass of water. Follow the directions on the prescription label. Take your medicine at regular intervals. Do not take it more often than directed. Do not stop taking except on your doctor's advice. Stopping this medicine may increase your risk of a blot clot. Be sure to refill your prescription before you run out of medicine. If you are taking this medicine after hip or knee replacement surgery, take it with or without food. If you are taking this medicine for atrial fibrillation, take it with  your evening meal. If you are taking this medicine to treat blood clots, take it with food at the same time each day. If you are unable to swallow your tablet, you may crush the tablet and mix it in applesauce. Then, immediately eat the applesauce. You should eat more food right after you eat the applesauce containing the  crushed tablet. Talk to your pediatrician regarding the use of this medicine in children. Special care may be needed. Overdosage: If you think you have taken too much of this medicine contact a poison control center or emergency room at once. NOTE: This medicine is only for you. Do not share this medicine with others. What if I miss a dose? If you take your medicine once a day and miss a dose, take the missed dose as soon as you remember. If you take your medicine twice a day and miss a dose, take the missed dose immediately. In this instance, 2 tablets may be taken at the same time. The next day you should take 1 tablet twice a day as directed. What may interact with this medicine? -aspirin and aspirin-like medicines -certain antibiotics like erythromycin, azithromycin, and clarithromycin -certain medicines for fungal infections like ketoconazole and itraconazole -certain medicines for irregular heart beat like amiodarone, quinidine, dronedarone -certain medicines for seizures like carbamazepine, phenytoin -certain medicines that treat or prevent blood clots like warfarin, enoxaparin, and dalteparin  -conivaptan -diltiazem -felodipine -indinavir -lopinavir; ritonavir -NSAIDS, medicines for pain and inflammation, like ibuprofen or naproxen -ranolazine -rifampin -ritonavir -St. John's wort -verapamil This list may not describe all possible interactions. Give your health care provider a list of all the medicines, herbs, non-prescription drugs, or dietary supplements you use. Also tell them if you smoke, drink alcohol, or use illegal drugs. Some items may interact with your medicine. What should I watch for while using this medicine? Visit your doctor or health care professional for regular checks on your progress. Your condition will be monitored carefully while you are receiving this medicine. Notify your doctor or health care professional and seek emergency treatment if you develop  breathing problems; changes in vision; chest pain; severe, sudden headache; pain, swelling, warmth in the leg; trouble speaking; sudden numbness or weakness of the face, arm, or leg. These can be signs that your condition has gotten worse. If you are going to have surgery, tell your doctor or health care professional that you are taking this medicine. Tell your health care professional that you use this medicine before you have a spinal or epidural procedure. Sometimes people who take this medicine have bleeding problems around the spine when they have a spinal or epidural procedure. This bleeding is very rare. If you have a spinal or epidural procedure while on this medicine, call your health care professional immediately if you have back pain, numbness or tingling (especially in your legs and feet), muscle weakness, paralysis, or loss of bladder or bowel control. Avoid sports and activities that might cause injury while you are using this medicine. Severe falls or injuries can cause unseen bleeding. Be careful when using sharp tools or knives. Consider using an Neurosurgeonelectric razor. Take special care brushing or flossing your teeth. Report any injuries, bruising, or red spots on the skin to your doctor or health care professional. What side effects may I notice from receiving this medicine? Side effects that you should report to your doctor or health care professional as soon as possible: -allergic reactions like skin rash, itching or hives, swelling of the  face, lips, or tongue -back pain -redness, blistering, peeling or loosening of the skin, including inside the mouth -signs and symptoms of bleeding such as bloody or black, tarry stools; red or dark-brown urine; spitting up blood or brown material that looks like coffee grounds; red spots on the skin; unusual bruising or bleeding from the eye, gums, or nose  Side effects that usually do not require medical attention (Report these to your doctor or health  care professional if they continue or are bothersome.): -dizziness -muscle pain This list may not describe all possible side effects. Call your doctor for medical advice about side effects. You may report side effects to FDA at 1-800-FDA-1088. Where should I keep my medicine? Keep out of the reach of children. Store at room temperature between 15 and 30 degrees C (59 and 86 degrees F). Throw away any unused medicine after the expiration date. NOTE: This sheet is a summary. It may not cover all possible information. If you have questions about this medicine, talk to your doctor, pharmacist, or health care provider.  2014, [employer]vier/[employer] Standard. (2013-05-08 09:51:31)

## 2014-04-14 ENCOUNTER — Other Ambulatory Visit (HOSPITAL_COMMUNITY): Payer: BC Managed Care – PPO

## 2014-04-14 ENCOUNTER — Ambulatory Visit (HOSPITAL_COMMUNITY): Payer: BC Managed Care – PPO

## 2014-04-15 NOTE — Progress Notes (Signed)
This encounter was created in error - please disregard.

## 2014-04-25 ENCOUNTER — Encounter (HOSPITAL_COMMUNITY): Payer: Self-pay

## 2014-08-07 ENCOUNTER — Other Ambulatory Visit (HOSPITAL_COMMUNITY): Payer: Self-pay | Admitting: Internal Medicine

## 2014-08-07 DIAGNOSIS — M549 Dorsalgia, unspecified: Secondary | ICD-10-CM

## 2014-08-13 ENCOUNTER — Ambulatory Visit (HOSPITAL_COMMUNITY)
Admission: RE | Admit: 2014-08-13 | Discharge: 2014-08-13 | Disposition: A | Discharge status: Home / Self Care | Payer: BC Managed Care – PPO | Source: Ambulatory Visit | Attending: Internal Medicine | Admitting: Internal Medicine

## 2014-08-13 DIAGNOSIS — M545 Low back pain, unspecified: Secondary | ICD-10-CM | POA: Diagnosis present

## 2014-08-13 DIAGNOSIS — M549 Dorsalgia, unspecified: Secondary | ICD-10-CM

## 2014-08-13 DIAGNOSIS — M5126 Other intervertebral disc displacement, lumbar region: Secondary | ICD-10-CM | POA: Diagnosis not present

## 2015-11-26 ENCOUNTER — Other Ambulatory Visit: Payer: Self-pay | Admitting: Surgical

## 2016-04-27 DIAGNOSIS — G4709 Other insomnia: Secondary | ICD-10-CM | POA: Diagnosis not present

## 2016-04-27 DIAGNOSIS — M1991 Primary osteoarthritis, unspecified site: Secondary | ICD-10-CM | POA: Diagnosis not present

## 2016-04-27 DIAGNOSIS — Z6827 Body mass index (BMI) 27.0-27.9, adult: Secondary | ICD-10-CM | POA: Diagnosis not present

## 2016-04-27 DIAGNOSIS — E782 Mixed hyperlipidemia: Secondary | ICD-10-CM | POA: Diagnosis not present

## 2016-04-27 DIAGNOSIS — G894 Chronic pain syndrome: Secondary | ICD-10-CM | POA: Diagnosis not present

## 2016-04-27 DIAGNOSIS — Z1389 Encounter for screening for other disorder: Secondary | ICD-10-CM | POA: Diagnosis not present

## 2016-04-27 DIAGNOSIS — I10 Essential (primary) hypertension: Secondary | ICD-10-CM | POA: Diagnosis not present

## 2016-08-03 DIAGNOSIS — Z6825 Body mass index (BMI) 25.0-25.9, adult: Secondary | ICD-10-CM | POA: Diagnosis not present

## 2016-08-03 DIAGNOSIS — M75102 Unspecified rotator cuff tear or rupture of left shoulder, not specified as traumatic: Secondary | ICD-10-CM | POA: Diagnosis not present

## 2016-08-03 DIAGNOSIS — Z1389 Encounter for screening for other disorder: Secondary | ICD-10-CM | POA: Diagnosis not present

## 2016-08-03 DIAGNOSIS — M1991 Primary osteoarthritis, unspecified site: Secondary | ICD-10-CM | POA: Diagnosis not present

## 2016-08-03 DIAGNOSIS — G4709 Other insomnia: Secondary | ICD-10-CM | POA: Diagnosis not present

## 2016-08-03 DIAGNOSIS — D6851 Activated protein C resistance: Secondary | ICD-10-CM | POA: Diagnosis not present

## 2016-11-04 DIAGNOSIS — Z6826 Body mass index (BMI) 26.0-26.9, adult: Secondary | ICD-10-CM | POA: Diagnosis not present

## 2016-11-04 DIAGNOSIS — G894 Chronic pain syndrome: Secondary | ICD-10-CM | POA: Diagnosis not present

## 2016-11-04 DIAGNOSIS — D6851 Activated protein C resistance: Secondary | ICD-10-CM | POA: Diagnosis not present

## 2016-11-04 DIAGNOSIS — Z23 Encounter for immunization: Secondary | ICD-10-CM | POA: Diagnosis not present

## 2016-11-04 DIAGNOSIS — Z1389 Encounter for screening for other disorder: Secondary | ICD-10-CM | POA: Diagnosis not present

## 2016-11-04 DIAGNOSIS — E663 Overweight: Secondary | ICD-10-CM | POA: Diagnosis not present

## 2016-12-02 DIAGNOSIS — M25512 Pain in left shoulder: Secondary | ICD-10-CM | POA: Diagnosis not present

## 2017-02-06 DIAGNOSIS — D6851 Activated protein C resistance: Secondary | ICD-10-CM | POA: Diagnosis not present

## 2017-02-06 DIAGNOSIS — Z1389 Encounter for screening for other disorder: Secondary | ICD-10-CM | POA: Diagnosis not present

## 2017-02-06 DIAGNOSIS — G894 Chronic pain syndrome: Secondary | ICD-10-CM | POA: Diagnosis not present

## 2017-02-06 DIAGNOSIS — Z6826 Body mass index (BMI) 26.0-26.9, adult: Secondary | ICD-10-CM | POA: Diagnosis not present

## 2017-03-20 DIAGNOSIS — H5213 Myopia, bilateral: Secondary | ICD-10-CM | POA: Diagnosis not present

## 2017-05-10 DIAGNOSIS — G894 Chronic pain syndrome: Secondary | ICD-10-CM | POA: Diagnosis not present

## 2017-05-10 DIAGNOSIS — Z79891 Long term (current) use of opiate analgesic: Secondary | ICD-10-CM | POA: Diagnosis not present

## 2017-05-10 DIAGNOSIS — G4700 Insomnia, unspecified: Secondary | ICD-10-CM | POA: Diagnosis not present

## 2017-05-10 DIAGNOSIS — D6851 Activated protein C resistance: Secondary | ICD-10-CM | POA: Diagnosis not present

## 2017-05-10 DIAGNOSIS — Z1389 Encounter for screening for other disorder: Secondary | ICD-10-CM | POA: Diagnosis not present

## 2017-05-10 DIAGNOSIS — Z6826 Body mass index (BMI) 26.0-26.9, adult: Secondary | ICD-10-CM | POA: Diagnosis not present

## 2017-05-10 DIAGNOSIS — E748 Other specified disorders of carbohydrate metabolism: Secondary | ICD-10-CM | POA: Diagnosis not present

## 2017-08-16 DIAGNOSIS — G4700 Insomnia, unspecified: Secondary | ICD-10-CM | POA: Diagnosis not present

## 2017-08-16 DIAGNOSIS — Z6827 Body mass index (BMI) 27.0-27.9, adult: Secondary | ICD-10-CM | POA: Diagnosis not present

## 2017-08-16 DIAGNOSIS — Z1389 Encounter for screening for other disorder: Secondary | ICD-10-CM | POA: Diagnosis not present

## 2017-08-16 DIAGNOSIS — G894 Chronic pain syndrome: Secondary | ICD-10-CM | POA: Diagnosis not present

## 2017-08-16 DIAGNOSIS — N529 Male erectile dysfunction, unspecified: Secondary | ICD-10-CM | POA: Diagnosis not present

## 2017-11-17 DIAGNOSIS — I10 Essential (primary) hypertension: Secondary | ICD-10-CM | POA: Diagnosis not present

## 2017-11-17 DIAGNOSIS — Z1389 Encounter for screening for other disorder: Secondary | ICD-10-CM | POA: Diagnosis not present

## 2017-11-17 DIAGNOSIS — Z6826 Body mass index (BMI) 26.0-26.9, adult: Secondary | ICD-10-CM | POA: Diagnosis not present

## 2017-11-17 DIAGNOSIS — Z23 Encounter for immunization: Secondary | ICD-10-CM | POA: Diagnosis not present

## 2017-11-17 DIAGNOSIS — G894 Chronic pain syndrome: Secondary | ICD-10-CM | POA: Diagnosis not present

## 2017-11-17 DIAGNOSIS — J329 Chronic sinusitis, unspecified: Secondary | ICD-10-CM | POA: Diagnosis not present

## 2017-11-17 DIAGNOSIS — M1991 Primary osteoarthritis, unspecified site: Secondary | ICD-10-CM | POA: Diagnosis not present

## 2017-11-28 ENCOUNTER — Emergency Department (HOSPITAL_COMMUNITY): Payer: BLUE CROSS/BLUE SHIELD

## 2017-11-28 ENCOUNTER — Encounter (HOSPITAL_COMMUNITY): Payer: Self-pay | Admitting: Emergency Medicine

## 2017-11-28 ENCOUNTER — Emergency Department (HOSPITAL_COMMUNITY)
Admission: EM | Admit: 2017-11-28 | Discharge: 2017-11-28 | Disposition: A | Discharge status: Home / Self Care | Payer: BLUE CROSS/BLUE SHIELD | Attending: Emergency Medicine | Admitting: Emergency Medicine

## 2017-11-28 DIAGNOSIS — R109 Unspecified abdominal pain: Secondary | ICD-10-CM | POA: Insufficient documentation

## 2017-11-28 DIAGNOSIS — A0472 Enterocolitis due to Clostridium difficile, not specified as recurrent: Secondary | ICD-10-CM | POA: Diagnosis not present

## 2017-11-28 DIAGNOSIS — Z87891 Personal history of nicotine dependence: Secondary | ICD-10-CM | POA: Diagnosis not present

## 2017-11-28 DIAGNOSIS — I10 Essential (primary) hypertension: Secondary | ICD-10-CM | POA: Insufficient documentation

## 2017-11-28 DIAGNOSIS — Z79899 Other long term (current) drug therapy: Secondary | ICD-10-CM | POA: Insufficient documentation

## 2017-11-28 DIAGNOSIS — K651 Peritoneal abscess: Secondary | ICD-10-CM | POA: Diagnosis not present

## 2017-11-28 DIAGNOSIS — R197 Diarrhea, unspecified: Secondary | ICD-10-CM | POA: Diagnosis present

## 2017-11-28 LAB — COMPREHENSIVE METABOLIC PANEL
ALT: 28 U/L (ref 17–63)
ANION GAP: 10 (ref 5–15)
AST: 22 U/L (ref 15–41)
Albumin: 4.1 g/dL (ref 3.5–5.0)
Alkaline Phosphatase: 84 U/L (ref 38–126)
BUN: 10 mg/dL (ref 6–20)
CALCIUM: 9 mg/dL (ref 8.9–10.3)
CHLORIDE: 104 mmol/L (ref 101–111)
CO2: 24 mmol/L (ref 22–32)
CREATININE: 0.99 mg/dL (ref 0.61–1.24)
Glucose, Bld: 104 mg/dL — ABNORMAL HIGH (ref 65–99)
Potassium: 3.9 mmol/L (ref 3.5–5.1)
Sodium: 138 mmol/L (ref 135–145)
Total Bilirubin: 0.9 mg/dL (ref 0.3–1.2)
Total Protein: 7.5 g/dL (ref 6.5–8.1)

## 2017-11-28 LAB — CBC
HCT: 45.7 % (ref 39.0–52.0)
HEMOGLOBIN: 14.4 g/dL (ref 13.0–17.0)
MCH: 27.3 pg (ref 26.0–34.0)
MCHC: 31.5 g/dL (ref 30.0–36.0)
MCV: 86.7 fL (ref 78.0–100.0)
PLATELETS: 252 10*3/uL (ref 150–400)
RBC: 5.27 MIL/uL (ref 4.22–5.81)
RDW: 13.3 % (ref 11.5–15.5)
WBC: 12.3 10*3/uL — AB (ref 4.0–10.5)

## 2017-11-28 LAB — URINALYSIS, ROUTINE W REFLEX MICROSCOPIC
Bilirubin Urine: NEGATIVE
Glucose, UA: NEGATIVE mg/dL
Hgb urine dipstick: NEGATIVE
Ketones, ur: NEGATIVE mg/dL
LEUKOCYTES UA: NEGATIVE
Nitrite: NEGATIVE
PROTEIN: NEGATIVE mg/dL
Specific Gravity, Urine: 1.014 (ref 1.005–1.030)
pH: 6 (ref 5.0–8.0)

## 2017-11-28 LAB — LIPASE, BLOOD: LIPASE: 30 U/L (ref 11–51)

## 2017-11-28 MED ORDER — SODIUM CHLORIDE 0.9 % IV BOLUS (SEPSIS)
1000.0000 mL | Freq: Once | INTRAVENOUS | Status: AC
  Administered 2017-11-28: 1000 mL via INTRAVENOUS

## 2017-11-28 MED ORDER — ONDANSETRON HCL 4 MG/2ML IJ SOLN
4.0000 mg | Freq: Once | INTRAMUSCULAR | Status: AC
  Administered 2017-11-28: 4 mg via INTRAVENOUS
  Filled 2017-11-28: qty 2

## 2017-11-28 MED ORDER — HYDROCODONE-ACETAMINOPHEN 5-325 MG PO TABS: 1.0000 | ORAL_TABLET | Freq: Four times a day (QID) | ORAL | 0 refills | Status: DC | PRN

## 2017-11-28 MED ORDER — SODIUM CHLORIDE 0.9 % IV SOLN
INTRAVENOUS | Status: DC
  Administered 2017-11-28: 21:00:00 via INTRAVENOUS

## 2017-11-28 MED ORDER — IOPAMIDOL (ISOVUE-300) INJECTION 61%
100.0000 mL | Freq: Once | INTRAVENOUS | Status: AC | PRN
  Administered 2017-11-28: 100 mL via INTRAVENOUS

## 2017-11-28 MED ORDER — METRONIDAZOLE 500 MG PO TABS
500.0000 mg | ORAL_TABLET | Freq: Once | ORAL | Status: AC
  Administered 2017-11-28: 500 mg via ORAL
  Filled 2017-11-28: qty 1

## 2017-11-28 MED ORDER — HYDROMORPHONE HCL 1 MG/ML IJ SOLN
1.0000 mg | Freq: Once | INTRAMUSCULAR | Status: AC
  Administered 2017-11-28: 1 mg via INTRAVENOUS
  Filled 2017-11-28: qty 1

## 2017-11-28 MED ORDER — METRONIDAZOLE 500 MG PO TABS: 500.0000 mg | ORAL_TABLET | Freq: Two times a day (BID) | ORAL | 0 refills | Status: AC

## 2017-11-28 NOTE — Discharge Instructions (Signed)
Take the antibiotic as directed for the next 10 days.  Return for any new or worse symptoms.  Call and make an appointment with your primary care doctor as well as with gastroenterology.  Referral information provided above.  Prescription for pain medicine provided as needed.

## 2017-11-28 NOTE — ED Triage Notes (Signed)
Pt states he has had D/ X5 days, has been able to keep food and fluids down. Denies N/V/, dysuria, bloody or tarry stools. Has had 10 episodes of D/ today. Had been taking Amoxicillin.

## 2017-11-28 NOTE — ED Provider Notes (Signed)
[facility] EMERGENCY DEPARTMENT Provider Note   CSN: [id number] Arrival date & time: 11/28/17  1927     History   Chief Complaint Chief Complaint  Patient presents with  . Diarrhea    HPI Preston Davis is a 46 y.o. male.  Patient with a complaint of watery diarrhea for 5 days.  Going about 10 times a day.  No nausea no vomiting.  No fevers.  Some crampy abdominal pain.  No blood in bowel movements.  Bowel movements not black.  About a month ago patient was on amoxicillin for an infected tooth.  Recently was started on amoxicillin for pharyngitis.  Patient is not on blood thinners anymore.  Was on them in the past for deep vein thrombosis.      Past Medical History:  Diagnosis Date  . Acid reflux   . DVT (deep venous thrombosis) (HCC) 2014   left leg  . Factor 5 Leiden mutation, heterozygous (HCC)   . Heart murmur   . High blood cholesterol level   . Hypertension   . Right shoulder pain     There are no active problems to display for this patient.   Past Surgical History:  Procedure Laterality Date  . APPENDECTOMY    . bone graft  right great toe     for bunion  . SHOULDER OPEN ROTATOR CUFF REPAIR Right 05/14/2013   Procedure: ROTATOR CUFF REPAIR SHOULDER OPEN;  Surgeon: Darreld Mclean, MD;  Location: [facility];  Service: Orthopedics;  Laterality: Right;  Marland Kitchen VASECTOMY         Home Medications    Prior to Admission medications   Medication Sig Start Date End Date Taking? Authorizing Provider  HYDROcodone-acetaminophen (NORCO) 10-325 MG tablet Take 1 tablet by mouth 4 (four) times daily.   Yes [provider]  nebivolol (BYSTOLIC) 5 MG tablet Take 5 mg by mouth daily.   Yes [provider]  sildenafil (VIAGRA) 100 MG tablet Take 50-100 mg by mouth daily as needed for erectile dysfunction. Takes 1/2 tablet as needed    Yes [provider]  simvastatin (ZOCOR) 20 MG tablet Take 40 mg by mouth every evening.    Yes [provider]    zolpidem (AMBIEN) 10 MG tablet Take 5-10 mg by mouth at bedtime as needed for sleep.    Yes [provider]  amoxicillin-clavulanate (AUGMENTIN) 875-125 MG tablet Take 1 tablet by mouth 2 (two) times daily. 10 day course 11/17/17   [provider]  HYDROcodone-acetaminophen (NORCO/VICODIN) 5-325 MG tablet Take 1-2 tablets by mouth every 6 (six) hours as needed. 11/28/17   Vanetta Mulders, MD  metroNIDAZOLE (FLAGYL) 500 MG tablet Take 1 tablet (500 mg total) by mouth 2 (two) times daily for 10 days. 11/28/17 12/08/17  Vanetta Mulders, MD    Family History Family History  Problem Relation Age of Onset  . Clotting disorder Mother   . Diabetes Paternal Grandmother     Social History Social History   Tobacco Use  . Smoking status: Former Smoker    Packs/day: 1.00    Types: Cigarettes    Last attempt to quit: 06/03/2013    Years since quitting: 4.4  . Smokeless tobacco: Never Used  Substance Use Topics  . Alcohol use: No  . Drug use: No     Allergies   Benicar [olmesartan]   Review of Systems Review of Systems  Constitutional: Negative for fever.  HENT: Negative for congestion.   Eyes: Negative for redness.  Respiratory: Negative for shortness of breath.   Cardiovascular: Negative for chest pain.  Gastrointestinal: Positive for abdominal pain and diarrhea. Negative for blood in stool, nausea and vomiting.  Genitourinary: Negative for hematuria.  Musculoskeletal: Negative for back pain.  Skin: Negative for rash.  Neurological: Negative for headaches.  Hematological: Does not bruise/bleed easily.  Psychiatric/Behavioral: Negative for confusion.     Physical Exam Updated Vital Signs BP 127/79 (BP Location: Right Arm)   Pulse (!) 58   Temp 99.7 F (37.6 C) (Oral)   Resp 14   Ht 1.727 m (5\' 8" )   Wt 77.1 kg (170 lb)   SpO2 98%   BMI 25.85 kg/m   Physical Exam  Constitutional: He is oriented to person, place, and time. He appears well-developed  and well-nourished. No distress.  HENT:  Head: Normocephalic and atraumatic.  Mouth/Throat: Oropharynx is clear and moist.  Eyes: Conjunctivae and EOM are normal. Pupils are equal, round, and reactive to light.  Neck: Normal range of motion. Neck supple.  Cardiovascular: Normal rate, regular rhythm and normal heart sounds.  Pulmonary/Chest: Effort normal and breath sounds normal.  Abdominal: Soft. Bowel sounds are normal.  Musculoskeletal: Normal range of motion.  Neurological: He is alert and oriented to person, place, and time. No cranial nerve deficit or sensory deficit. He exhibits normal muscle tone. Coordination normal.  Skin: Skin is warm. No rash noted.  Nursing note and vitals reviewed.    ED Treatments / Results  Labs (all labs ordered are listed, but only abnormal results are displayed) Labs Reviewed  COMPREHENSIVE METABOLIC PANEL - Abnormal; Notable for the following components:      Result Value   Glucose, Bld 104 (*)    All other components within normal limits  CBC - Abnormal; Notable for the following components:   WBC 12.3 (*)    All other components within normal limits  URINALYSIS, ROUTINE W REFLEX MICROSCOPIC - Abnormal; Notable for the following components:   Color, Urine STRAW (*)    All other components within normal limits  C DIFFICILE QUICK SCREEN W PCR REFLEX  LIPASE, BLOOD    EKG  EKG Interpretation None       Radiology Ct Abdomen Pelvis W Contrast  Result Date: 11/28/2017 CLINICAL DATA:  46 year old male with abdominal infection. EXAM: CT ABDOMEN AND PELVIS WITH CONTRAST TECHNIQUE: Multidetector CT imaging of the abdomen and pelvis was performed using the standard protocol following bolus administration of intravenous contrast. CONTRAST:  100mL ISOVUE-300 IOPAMIDOL (ISOVUE-300) INJECTION 61% COMPARISON:  Lumbar spine MRI dated 08/13/2014 FINDINGS: Lower chest: The visualized lung bases are clear. No intra-abdominal free air or free fluid.  Hepatobiliary: No focal liver abnormality is seen. No gallstones, gallbladder wall thickening, or biliary dilatation. Pancreas: Unremarkable. No pancreatic ductal dilatation or surrounding inflammatory changes. Spleen: Normal in size without focal abnormality. Adrenals/Urinary Tract: Adrenal glands are unremarkable. Kidneys are normal, without renal calculi, focal lesion, or hydronephrosis. Bladder is unremarkable. Stomach/Bowel: There is diffuse thickened and inflamed appearance of the colon consistent with pancolitis. Findings may be related to Celsius diff colitis. Correlation with history of recent antibiotic use as well as with stool culture recommended. There is no bowel obstruction. Appendectomy. Vascular/Lymphatic: Moderate aortoiliac atherosclerotic disease. The SMV, splenic vein, and main portal vein are patent. No portal venous gas. There is no adenopathy. Reproductive: The prostate and seminal vesicles are grossly unremarkable. Other: Small fat containing umbilical hernia. Musculoskeletal: No acute or significant osseous findings. IMPRESSION: 1. Pancolitis suspicious for pseudomembranous colitis.  Correlation with stool cultures as well as with history of recent antibiotic use recommended. 2.  Aortic Atherosclerosis (ICD10-I70.0). Electronically Signed   By: Elgie CollardArash  Radparvar M.D.   On: 11/28/2017 22:07    Procedures Procedures (including critical care time)  Medications Ordered in ED Medications  0.9 %  sodium chloride infusion ( Intravenous Stopped 11/28/17 2230)  metroNIDAZOLE (FLAGYL) tablet 500 mg (not administered)  sodium chloride 0.9 % bolus 1,000 mL (0 mLs Intravenous Stopped 11/28/17 2230)  ondansetron (ZOFRAN) injection 4 mg (4 mg Intravenous Given 11/28/17 2053)  HYDROmorphone (DILAUDID) injection 1 mg (1 mg Intravenous Given 11/28/17 2053)  iopamidol (ISOVUE-300) 61 % injection 100 mL (100 mLs Intravenous Contrast Given 11/28/17 2138)     Initial Impression / Assessment and  Plan / ED Course  I have reviewed the triage vital signs and the nursing notes.  Pertinent labs & imaging results that were available during my care of the patient were reviewed by me and considered in my medical decision making (see chart for details).    CT scan consistent with a pseudomembranous colitis.  Clinically was suspicious for the possibility of a C. difficile infection.  PRC ordered.  Patient given first dose of Flagyl here.  Will need to continue Flagyl for the next 10 days.  Given referral information to GI medicine and also back to his primary care doctor.  Patient will return for any new or worse symptoms.  Patient nontoxic no acute distress.  Patient also given a short-term prescription for hydrocodone for the crampy abdominal pain.  Patient's leukocytosis is less than 15 and his renal function is normal.  So this would be considered an uncomplicated C. difficile infection.  Therefore can be treated orally and as an outpatient.   Final Clinical Impressions(s) / ED Diagnoses   Final diagnoses:  C. difficile diarrhea    ED Discharge Orders        Ordered    metroNIDAZOLE (FLAGYL) 500 MG tablet  2 times daily     11/28/17 2251    HYDROcodone-acetaminophen (NORCO/VICODIN) 5-325 MG tablet  Every 6 hours PRN     11/28/17 2251       Vanetta MuldersZackowski, , MD 11/28/17 2259

## 2017-12-15 ENCOUNTER — Inpatient Hospital Stay (HOSPITAL_COMMUNITY)
Admission: EM | Admit: 2017-12-15 | Discharge: 2017-12-18 | DRG: 372 | Disposition: A | Discharge status: Home / Self Care | Payer: BLUE CROSS/BLUE SHIELD | Attending: Internal Medicine | Admitting: Internal Medicine

## 2017-12-15 ENCOUNTER — Other Ambulatory Visit: Payer: Self-pay

## 2017-12-15 ENCOUNTER — Emergency Department (HOSPITAL_COMMUNITY): Payer: BLUE CROSS/BLUE SHIELD

## 2017-12-15 ENCOUNTER — Encounter (HOSPITAL_COMMUNITY): Payer: Self-pay | Admitting: Emergency Medicine

## 2017-12-15 DIAGNOSIS — R630 Anorexia: Secondary | ICD-10-CM | POA: Diagnosis not present

## 2017-12-15 DIAGNOSIS — Z87891 Personal history of nicotine dependence: Secondary | ICD-10-CM | POA: Diagnosis not present

## 2017-12-15 DIAGNOSIS — Z79891 Long term (current) use of opiate analgesic: Secondary | ICD-10-CM | POA: Diagnosis not present

## 2017-12-15 DIAGNOSIS — Z9049 Acquired absence of other specified parts of digestive tract: Secondary | ICD-10-CM | POA: Diagnosis not present

## 2017-12-15 DIAGNOSIS — E7800 Pure hypercholesterolemia, unspecified: Secondary | ICD-10-CM | POA: Diagnosis not present

## 2017-12-15 DIAGNOSIS — I959 Hypotension, unspecified: Secondary | ICD-10-CM | POA: Diagnosis not present

## 2017-12-15 DIAGNOSIS — Z832 Family history of diseases of the blood and blood-forming organs and certain disorders involving the immune mechanism: Secondary | ICD-10-CM

## 2017-12-15 DIAGNOSIS — R1084 Generalized abdominal pain: Secondary | ICD-10-CM | POA: Diagnosis not present

## 2017-12-15 DIAGNOSIS — Z9852 Vasectomy status: Secondary | ICD-10-CM

## 2017-12-15 DIAGNOSIS — I10 Essential (primary) hypertension: Secondary | ICD-10-CM

## 2017-12-15 DIAGNOSIS — Z888 Allergy status to other drugs, medicaments and biological substances status: Secondary | ICD-10-CM | POA: Diagnosis not present

## 2017-12-15 DIAGNOSIS — D6851 Activated protein C resistance: Secondary | ICD-10-CM | POA: Diagnosis present

## 2017-12-15 DIAGNOSIS — Z6824 Body mass index (BMI) 24.0-24.9, adult: Secondary | ICD-10-CM

## 2017-12-15 DIAGNOSIS — Z79899 Other long term (current) drug therapy: Secondary | ICD-10-CM | POA: Diagnosis not present

## 2017-12-15 DIAGNOSIS — A0472 Enterocolitis due to Clostridium difficile, not specified as recurrent: Principal | ICD-10-CM | POA: Diagnosis present

## 2017-12-15 DIAGNOSIS — D682 Hereditary deficiency of other clotting factors: Secondary | ICD-10-CM

## 2017-12-15 DIAGNOSIS — G8929 Other chronic pain: Secondary | ICD-10-CM | POA: Diagnosis not present

## 2017-12-15 DIAGNOSIS — R197 Diarrhea, unspecified: Secondary | ICD-10-CM | POA: Diagnosis not present

## 2017-12-15 DIAGNOSIS — R011 Cardiac murmur, unspecified: Secondary | ICD-10-CM | POA: Diagnosis not present

## 2017-12-15 DIAGNOSIS — Z86718 Personal history of other venous thrombosis and embolism: Secondary | ICD-10-CM | POA: Diagnosis not present

## 2017-12-15 HISTORY — DX: Enterocolitis due to Clostridium difficile, not specified as recurrent: A04.72

## 2017-12-15 LAB — COMPREHENSIVE METABOLIC PANEL
ALBUMIN: 3.7 g/dL (ref 3.5–5.0)
ALT: 35 U/L (ref 17–63)
ANION GAP: 12 (ref 5–15)
AST: 28 U/L (ref 15–41)
Alkaline Phosphatase: 71 U/L (ref 38–126)
BILIRUBIN TOTAL: 0.9 mg/dL (ref 0.3–1.2)
BUN: 15 mg/dL (ref 6–20)
CALCIUM: 8.7 mg/dL — AB (ref 8.9–10.3)
CO2: 22 mmol/L (ref 22–32)
Chloride: 103 mmol/L (ref 101–111)
Creatinine, Ser: 1.09 mg/dL (ref 0.61–1.24)
GFR calc Af Amer: >60 mL/min (ref >60)
GFR calc non Af Amer: >60 mL/min (ref >60)
GLUCOSE: 157 mg/dL — AB (ref 65–99)
Potassium: 4.1 mmol/L (ref 3.5–5.1)
Sodium: 137 mmol/L (ref 135–145)
TOTAL PROTEIN: 6.4 g/dL — AB (ref 6.5–8.1)

## 2017-12-15 LAB — CBC
HCT: 41.7 % (ref 39.0–52.0)
Hemoglobin: 13.1 g/dL (ref 13.0–17.0)
MCH: 27.3 pg (ref 26.0–34.0)
MCHC: 31.4 g/dL (ref 30.0–36.0)
MCV: 87.1 fL (ref 78.0–100.0)
Platelets: 218 10*3/uL (ref 150–400)
RBC: 4.79 MIL/uL (ref 4.22–5.81)
RDW: 14 % (ref 11.5–15.5)
WBC: 9.9 10*3/uL (ref 4.0–10.5)

## 2017-12-15 LAB — C DIFFICILE QUICK SCREEN W PCR REFLEX
C DIFFICILE (CDIFF) TOXIN: POSITIVE — AB
C DIFFICLE (CDIFF) ANTIGEN: POSITIVE — AB
C Diff interpretation: DETECTED

## 2017-12-15 LAB — URINALYSIS, ROUTINE W REFLEX MICROSCOPIC
BILIRUBIN URINE: NEGATIVE
Glucose, UA: NEGATIVE mg/dL
Hgb urine dipstick: NEGATIVE
KETONES UR: NEGATIVE mg/dL
Leukocytes, UA: NEGATIVE
NITRITE: NEGATIVE
Protein, ur: NEGATIVE mg/dL
Specific Gravity, Urine: 1.018 (ref 1.005–1.030)
pH: 6 (ref 5.0–8.0)

## 2017-12-15 LAB — LIPASE, BLOOD: Lipase: 32 U/L (ref 11–51)

## 2017-12-15 MED ORDER — MORPHINE SULFATE (PF) 4 MG/ML IV SOLN
4.0000 mg | Freq: Once | INTRAVENOUS | Status: AC
  Administered 2017-12-15: 4 mg via INTRAVENOUS
  Filled 2017-12-15: qty 1

## 2017-12-15 MED ORDER — PANTOPRAZOLE SODIUM 40 MG PO TBEC
40.0000 mg | DELAYED_RELEASE_TABLET | Freq: Every day | ORAL | Status: DC
  Administered 2017-12-16 – 2017-12-18 (×4): 40 mg via ORAL
  Filled 2017-12-15 (×4): qty 1

## 2017-12-15 MED ORDER — SODIUM CHLORIDE 0.9 % IV BOLUS (SEPSIS)
1000.0000 mL | Freq: Once | INTRAVENOUS | Status: AC
  Administered 2017-12-15: 1000 mL via INTRAVENOUS

## 2017-12-15 MED ORDER — NEBIVOLOL HCL 10 MG PO TABS
5.0000 mg | ORAL_TABLET | Freq: Every day | ORAL | Status: DC
Start: 2017-12-16 — End: 2017-12-18
  Administered 2017-12-16 – 2017-12-18 (×3): 5 mg via ORAL
  Filled 2017-12-15 (×3): qty 1

## 2017-12-15 MED ORDER — SIMVASTATIN 20 MG PO TABS
40.0000 mg | ORAL_TABLET | Freq: Every day | ORAL | Status: DC
  Administered 2017-12-16 – 2017-12-17 (×3): 40 mg via ORAL
  Filled 2017-12-15 (×3): qty 2

## 2017-12-15 MED ORDER — MORPHINE SULFATE (PF) 4 MG/ML IV SOLN
4.0000 mg | Freq: Once | INTRAVENOUS | Status: AC
Start: 2017-12-15 — End: 2017-12-15
  Administered 2017-12-15: 4 mg via INTRAVENOUS
  Filled 2017-12-15: qty 1

## 2017-12-15 MED ORDER — METRONIDAZOLE IN NACL 5-0.79 MG/ML-% IV SOLN
500.0000 mg | Freq: Once | INTRAVENOUS | Status: AC
  Administered 2017-12-15: 500 mg via INTRAVENOUS
  Filled 2017-12-15: qty 100

## 2017-12-15 MED ORDER — MORPHINE SULFATE (PF) 2 MG/ML IV SOLN
2.0000 mg | INTRAVENOUS | Status: DC | PRN
  Filled 2017-12-15: qty 1

## 2017-12-15 MED ORDER — BACID PO TABS
2.0000 | ORAL_TABLET | Freq: Three times a day (TID) | ORAL | Status: DC
  Administered 2017-12-16 – 2017-12-17 (×7): 2 via ORAL
  Filled 2017-12-15 (×14): qty 2

## 2017-12-15 MED ORDER — SODIUM CHLORIDE 0.9 % IV SOLN
INTRAVENOUS | Status: AC
  Administered 2017-12-15: 22:00:00 via INTRAVENOUS

## 2017-12-15 MED ORDER — ONDANSETRON HCL 4 MG/2ML IJ SOLN
4.0000 mg | Freq: Four times a day (QID) | INTRAMUSCULAR | Status: DC | PRN
  Administered 2017-12-15 – 2017-12-16 (×2): 4 mg via INTRAVENOUS
  Filled 2017-12-15 (×2): qty 2

## 2017-12-15 MED ORDER — DICYCLOMINE HCL 10 MG/ML IM SOLN
20.0000 mg | Freq: Once | INTRAMUSCULAR | Status: AC
  Administered 2017-12-15: 20 mg via INTRAMUSCULAR
  Filled 2017-12-15: qty 2

## 2017-12-15 MED ORDER — VANCOMYCIN 50 MG/ML ORAL SOLUTION
125.0000 mg | Freq: Four times a day (QID) | ORAL | Status: DC
  Administered 2017-12-15 – 2017-12-18 (×12): 125 mg via ORAL
  Filled 2017-12-15 (×21): qty 2.5

## 2017-12-15 MED ORDER — ONDANSETRON HCL 4 MG PO TABS: 4.0000 mg | ORAL_TABLET | Freq: Four times a day (QID) | ORAL | Status: DC | PRN

## 2017-12-15 MED ORDER — ENOXAPARIN SODIUM 40 MG/0.4ML ~~LOC~~ SOLN
40.0000 mg | SUBCUTANEOUS | Status: DC
  Administered 2017-12-15 – 2017-12-17 (×3): 40 mg via SUBCUTANEOUS
  Filled 2017-12-15 (×3): qty 0.4

## 2017-12-15 MED ORDER — ZOLPIDEM TARTRATE 5 MG PO TABS
5.0000 mg | ORAL_TABLET | Freq: Every evening | ORAL | Status: DC | PRN
  Administered 2017-12-16 – 2017-12-17 (×2): 5 mg via ORAL
  Filled 2017-12-15 (×2): qty 1

## 2017-12-15 MED ORDER — MORPHINE SULFATE (PF) 4 MG/ML IV SOLN: 4.0000 mg | INTRAVENOUS | Status: DC | PRN

## 2017-12-15 MED ORDER — IOPAMIDOL (ISOVUE-300) INJECTION 61%
100.0000 mL | Freq: Once | INTRAVENOUS | Status: AC | PRN
  Administered 2017-12-15: 100 mL via INTRAVENOUS

## 2017-12-15 MED ORDER — HYDROMORPHONE HCL 1 MG/ML IJ SOLN
1.0000 mg | INTRAMUSCULAR | Status: DC | PRN
  Administered 2017-12-15: 1 mg via INTRAVENOUS
  Filled 2017-12-15: qty 1

## 2017-12-15 MED ORDER — METRONIDAZOLE 500 MG PO TABS
500.0000 mg | ORAL_TABLET | Freq: Once | ORAL | Status: AC
  Administered 2017-12-15: 500 mg via ORAL
  Filled 2017-12-15: qty 1

## 2017-12-15 NOTE — ED Triage Notes (Signed)
Dx D with C diff in Dec  Finished antibiotic  Has not seen PCP for follow up   D since last night   4 loose stools today

## 2017-12-15 NOTE — ED Provider Notes (Signed)
[facility] Provider Note   CSN: [id number] Arrival date & time: 12/15/17  1230     History   Chief Complaint Chief Complaint  Patient presents with  . Diarrhea    HPI Preston Davis is a 47 y.o. male.  HPI  Concern of multiple episodes of loose stool, diffuse lower abdominal pain. Patient states that he had been recovering generally well following evaluation here 3 weeks ago. He completed a course of antibiotic for presumed C. difficile colitis, and bowel movements had returned to essentially normal. However, over the past 24 hours he has developed diffuse lower abdominal crampy pain with radiation to his back, as well as diarrhea. No nausea, no vomiting. There is some anorexia. There is generalized weakness as well. No fever. Patient is here with his wife who assists with the HPI. Since his recent evaluation he has not yet seen gastroenterology, nor had a colonoscopy.   Past Medical History:  Diagnosis Date  . Acid reflux   . C. difficile colitis   . DVT (deep venous thrombosis) (HCC) 2014   left leg  . Factor 5 Leiden mutation, heterozygous (HCC)   . Heart murmur   . High blood cholesterol level   . Hypertension   . Right shoulder pain     There are no active problems to display for this patient.   Past Surgical History:  Procedure Laterality Date  . APPENDECTOMY    . bone graft  right great toe     for bunion  . SHOULDER OPEN ROTATOR CUFF REPAIR Right 05/14/2013   Procedure: ROTATOR CUFF REPAIR SHOULDER OPEN;  Surgeon: Darreld McleanWayne Keeling, MD;  Location: [facility];  Service: Orthopedics;  Laterality: Right;  Marland Kitchen. VASECTOMY         Home Medications    Prior to Admission medications   Medication Sig Start Date End Date Taking? Authorizing Provider  HYDROcodone-acetaminophen (NORCO) 10-325 MG tablet Take 1 tablet by mouth 4 (four) times daily.   Yes [provider]  nebivolol (BYSTOLIC) 5 MG tablet Take 5 mg by mouth daily.   Yes  [provider]  sildenafil (VIAGRA) 100 MG tablet Take 50-100 mg by mouth daily as needed for erectile dysfunction. Takes 1/2 tablet as needed    Yes [provider]  simvastatin (ZOCOR) 40 MG tablet Take 40 mg by mouth daily. 09/13/17  Yes [provider]  zolpidem (AMBIEN) 10 MG tablet Take 5-10 mg by mouth at bedtime as needed for sleep.    Yes [provider]  amoxicillin-clavulanate (AUGMENTIN) 875-125 MG tablet Take 1 tablet by mouth 2 (two) times daily. 10 day course 11/17/17   [provider]  HYDROcodone-acetaminophen (NORCO/VICODIN) 5-325 MG tablet Take 1-2 tablets by mouth every 6 (six) hours as needed. Patient not taking: Reported on 12/15/2017 11/28/17   Vanetta MuldersZackowski, Scott, MD    Family History Family History  Problem Relation Age of Onset  . Clotting disorder Mother   . Diabetes Paternal Grandmother     Social History Social History   Tobacco Use  . Smoking status: Former Smoker    Packs/day: 1.00    Types: Cigarettes    Last attempt to quit: 06/03/2013    Years since quitting: 4.5  . Smokeless tobacco: Never Used  Substance Use Topics  . Alcohol use: No  . Drug use: No     Allergies   Benicar [olmesartan]   Review of Systems Review of Systems  Constitutional:  Per HPI, otherwise negative  HENT:       Per HPI, otherwise negative  Respiratory:       Per HPI, otherwise negative  Cardiovascular:       Per HPI, otherwise negative  Gastrointestinal: Positive for abdominal pain and diarrhea. Negative for vomiting.  Endocrine:       Negative aside from HPI  Genitourinary:       Neg aside from HPI   Musculoskeletal:       Per HPI, otherwise negative  Skin: Negative.   Neurological: Negative for syncope.     Physical Exam Updated Vital Signs BP 112/70 (BP Location: Right Arm)   Pulse 63   Temp 98.4 F (36.9 C) (Oral)   Resp 16   SpO2 99%   Physical Exam  Constitutional: He is oriented to person,  place, and time. He appears well-developed. No distress.  HENT:  Head: Normocephalic and atraumatic.  Eyes: Conjunctivae and EOM are normal.  Cardiovascular: Normal rate and regular rhythm.  Pulmonary/Chest: Effort normal. No stridor. No respiratory distress.  Abdominal: He exhibits no distension.  Mild diffuse lower abdominal discomfort, no guarding, no rebound  Musculoskeletal: He exhibits no edema.  Neurological: He is alert and oriented to person, place, and time.  Skin: Skin is warm and dry.  Psychiatric: He has a normal mood and affect.  Nursing note and vitals reviewed.    ED Treatments / Results  Labs (all labs ordered are listed, but only abnormal results are displayed) Labs Reviewed  C DIFFICILE QUICK SCREEN W PCR REFLEX - Abnormal; Notable for the following components:      Result Value   C Diff antigen POSITIVE (*)    C Diff toxin POSITIVE (*)    All other components within normal limits  COMPREHENSIVE METABOLIC PANEL - Abnormal; Notable for the following components:   Glucose, Bld 157 (*)    Calcium 8.7 (*)    Total Protein 6.4 (*)    All other components within normal limits  LIPASE, BLOOD  CBC  URINALYSIS, ROUTINE W REFLEX MICROSCOPIC    Radiology Ct Abdomen Pelvis W Contrast  Result Date: 12/15/2017 CLINICAL DATA:  Diarrhea.  Recent diagnosis of colitis. EXAM: CT ABDOMEN AND PELVIS WITH CONTRAST TECHNIQUE: Multidetector CT imaging of the abdomen and pelvis was performed using the standard protocol following bolus administration of intravenous contrast. CONTRAST:  ISOVUE-300 IOPAMIDOL (ISOVUE-300) INJECTION 61% COMPARISON:  11/28/2017 FINDINGS: Lower chest: Normal Hepatobiliary: Normal Pancreas: Normal Spleen: Normal Adrenals/Urinary Tract: Adrenal glands are normal. Kidneys are normal. No cyst, mass, stone or hydronephrosis. Stomach/Bowel: Redemonstration diffuse colitis with wall thickening from the cecum to the rectum. No evidence of perforation. Small  bowel appears normal. Vascular/Lymphatic: Aortic atherosclerosis. No aneurysm. IVC is normal. No retroperitoneal adenopathy. Reproductive: Normal Other: No free fluid or air. Musculoskeletal: Negative IMPRESSION: Redemonstration of diffuse wall thickening with mild surrounding inflammatory change throughout the colon consistent with diffuse colitis. No sign of perforation or ascites. Electronically Signed   By: Paulina Fusi M.D.   On: 12/15/2017 16:53    Procedures Procedures (including critical care time)  Medications Ordered in ED Medications  metroNIDAZOLE (FLAGYL) IVPB 500 mg (not administered)  metroNIDAZOLE (FLAGYL) tablet 500 mg (not administered)  morphine 4 MG/ML injection 4 mg (not administered)  morphine 4 MG/ML injection 4 mg (4 mg Intravenous Given 12/15/17 1610)  dicyclomine (BENTYL) injection 20 mg (20 mg Intramuscular Given 12/15/17 1603)  sodium chloride 0.9 % bolus 1,000 mL (1,000 mLs Intravenous New  Bag/Given 12/15/17 1631)  iopamidol (ISOVUE-300) 61 % injection 100 mL (100 mLs Intravenous Contrast Given 12/15/17 1631)     Initial Impression / Assessment and Plan / ED Course  I have reviewed the triage vital signs and the nursing notes.  Pertinent labs & imaging results that were available during my care of the patient were reviewed by me and considered in my medical decision making (see chart for details).  3 weeks ago with diagnosis of colitis, with presumed Clostridium difficile. Patient started on antibiotics, steroids.  Update: Patient hypotensive. Update: After receiving fluids blood pressure now 114 systolic.  5:26 PM I discussed the patient's CT results, lab results with him. With concern for recurrent colitis, with positive C. difficile result, the patient will start both IV and oral metronidazole therapy Given his episodic hypotension, substantial pain, the patient will require admission for further evaluation and management.   Final Clinical Impressions(s)  / ED Diagnoses  Clostridium difficile colitis   Gerhard Munch, MD 12/15/17 1727

## 2017-12-15 NOTE — ED Notes (Signed)
Pt has had 2 small mucous like incontinent stools

## 2017-12-15 NOTE — ED Notes (Signed)
Dr Jeraldine LootsLockwood notified of BP. 1000ml bolus adm. Pt transported to CT

## 2017-12-15 NOTE — H&P (Signed)
History and Physical    Preston Davis DOB: 03/10/1971 DOA: 12/15/2017  PCP: Elfredia NevinsFusco, Lawrence, MD   Patient coming from: Home  Chief Complaint: Loose stools  HPI: Preston Davis is a 47 y.o. male with medical history significant for HTN, DVT, factor 5 leiden heterozygous, Recent C. Diff colitis, who just completed 10 day course of treatment with metronidazole.  Patient reports started having loose stools last night, 4 stools since onset, watery, nonbloody , with generalized abdominal pain.  Patient endorses chills but no fevers.  One episode of vomiting here in the ED. Patient presented to the ED and was diagnosed with C. difficile colitis 11/28/17, stool C. difficile positive, abdominal CT with contrast showed pancolitis suspicious for pseudomembranous colitis.  Pt was given a  dose of on IV metronidazole and discharged home to complete 10 days of p.o. Metronidazole.  Endorses compliance with regimen, but cant when he took his last dose.  Reports his diarrhea resolved until last night.  ED Course: Stable vitals.  WBC normal at 9.9.  Creatinine at baseline, 1.09.  Stool c diff positive.  IV metronidazole given in the ED. CT abdomen and pelvis-redemonstration of diffuse wall thickening with mild surrounding inflammatory change throughout the colon consistent with diffuse colitis.  Review of Systems: As per HPI otherwise 10 point review of systems negative.  Past Medical History:  Diagnosis Date  . Acid reflux   . C. difficile colitis   . DVT (deep venous thrombosis) (HCC) 2014   left leg  . Factor 5 Leiden mutation, heterozygous (HCC)   . Heart murmur   . High blood cholesterol level   . Hypertension   . Right shoulder pain     Past Surgical History:  Procedure Laterality Date  . APPENDECTOMY    . bone graft  right great toe     for bunion  . SHOULDER OPEN ROTATOR CUFF REPAIR Right 05/14/2013   Procedure: ROTATOR CUFF REPAIR SHOULDER OPEN;  Surgeon: Darreld McleanWayne Keeling, MD;   Location: [facility];  Service: Orthopedics;  Laterality: Right;  Marland Kitchen. VASECTOMY       reports that he quit smoking about 4 years ago. His smoking use included cigarettes. He smoked 1.00 pack per day. he has never used smokeless tobacco. He reports that he does not drink alcohol or use drugs.  Allergies  Allergen Reactions  . Benicar [Olmesartan] Other (See Comments)    REACTION: Unable to regulate blood pressure with this medication--caused BP levels to drop too low.    Family History  Problem Relation Age of Onset  . Clotting disorder Mother   . Diabetes Paternal Grandmother     Prior to Admission medications   Medication Sig Start Date End Date Taking? Authorizing Provider  HYDROcodone-acetaminophen (NORCO) 10-325 MG tablet Take 1 tablet by mouth 4 (four) times daily.   Yes [provider]  nebivolol (BYSTOLIC) 5 MG tablet Take 5 mg by mouth daily.   Yes [provider]  sildenafil (VIAGRA) 100 MG tablet Take 50-100 mg by mouth daily as needed for erectile dysfunction. Takes 1/2 tablet as needed    Yes [provider]  simvastatin (ZOCOR) 40 MG tablet Take 40 mg by mouth daily. 09/13/17  Yes [provider]  zolpidem (AMBIEN) 10 MG tablet Take 5-10 mg by mouth at bedtime as needed for sleep.    Yes [provider]  amoxicillin-clavulanate (AUGMENTIN) 875-125 MG tablet Take 1 tablet by mouth 2 (two) times daily. 10 day course 11/17/17  [provider]  HYDROcodone-acetaminophen (NORCO/VICODIN) 5-325 MG tablet Take 1-2 tablets by mouth every 6 (six) hours as needed. Patient not taking: Reported on 12/15/2017 11/28/17   Vanetta Mulders, MD    Physical Exam: Vitals:   12/15/17 1830 12/15/17 1900 12/15/17 1905 12/15/17 1909  BP: 125/83 121/84    Pulse:  63    Resp:      Temp:      TempSrc:      SpO2:  98% 96%   Weight:    77.1 kg (170 lb)  Height:    5\' 8"  (1.727 m)    Constitutional: NAD, calm, comfortable Vitals:   12/15/17  1830 12/15/17 1900 12/15/17 1905 12/15/17 1909  BP: 125/83 121/84    Pulse:  63    Resp:      Temp:      TempSrc:      SpO2:  98% 96%   Weight:    77.1 kg (170 lb)  Height:    5\' 8"  (1.727 m)   Eyes: PERRL, lids and conjunctivae normal ENMT: Mucous membranes are moist. Posterior pharynx clear of any exudate or lesions.Normal dentition.  Neck: normal, supple, no masses, no thyromegaly Respiratory: clear to auscultation bilaterally, no wheezing, no crackles. Normal respiratory effort. No accessory muscle use.  Cardiovascular: 2/6 systolic murmur-old,  regular rate and rhythm, no murmurs / rubs / gallops. No extremity edema. 2+ pedal pulses. No carotid bruits.  Abdomen: Epigastric and lower abdominal tenderness, no masses palpated. No hepatosplenomegaly. Bowel sounds positive.  Musculoskeletal: no clubbing / cyanosis. No joint deformity upper and lower extremities. Good ROM, no contractures. Normal muscle tone.  Skin: no rashes, lesions, ulcers. No induration Neurologic: CN 2-12 grossly intact. Sensation intact, DTR normal. Strength 5/5 in all 4.  Psychiatric: Normal judgment and insight. Alert and oriented x 3. Normal mood.   Labs on Admission: I have personally reviewed following labs and imaging studies  CBC: Recent Labs  Lab 12/15/17 1312  WBC 9.9  HGB 13.1  HCT 41.7  MCV 87.1  PLT 218   Basic Metabolic Panel: Recent Labs  Lab 12/15/17 1312  NA 137  K 4.1  CL 103  CO2 22  GLUCOSE 157*  BUN 15  CREATININE 1.09  CALCIUM 8.7*   GFR: Estimated Creatinine Clearance: 80.2 mL/min (by C-G formula based on SCr of 1.09 mg/dL). Liver Function Tests: Recent Labs  Lab 12/15/17 1312  AST 28  ALT 35  ALKPHOS 71  BILITOT 0.9  PROT 6.4*  ALBUMIN 3.7   Recent Labs  Lab 12/15/17 1312  LIPASE 32   Urine analysis:    Component Value Date/Time   COLORURINE YELLOW 12/15/2017 1513   APPEARANCEUR CLEAR 12/15/2017 1513   LABSPEC 1.018 12/15/2017 1513   PHURINE 6.0  12/15/2017 1513   GLUCOSEU NEGATIVE 12/15/2017 1513   HGBUR NEGATIVE 12/15/2017 1513   BILIRUBINUR NEGATIVE 12/15/2017 1513   KETONESUR NEGATIVE 12/15/2017 1513   PROTEINUR NEGATIVE 12/15/2017 1513   UROBILINOGEN 0.2 05/14/2013 0611   NITRITE NEGATIVE 12/15/2017 1513   LEUKOCYTESUR NEGATIVE 12/15/2017 1513    Radiological Exams on Admission: Ct Abdomen Pelvis W Contrast  Result Date: 12/15/2017 CLINICAL DATA:  Diarrhea.  Recent diagnosis of colitis. EXAM: CT ABDOMEN AND PELVIS WITH CONTRAST TECHNIQUE: Multidetector CT imaging of the abdomen and pelvis was performed using the standard protocol following bolus administration of intravenous contrast. CONTRAST:  ISOVUE-300 IOPAMIDOL (ISOVUE-300) INJECTION 61% COMPARISON:  11/28/2017 FINDINGS: Lower chest: Normal Hepatobiliary: Normal Pancreas: Normal Spleen: Normal  Adrenals/Urinary Tract: Adrenal glands are normal. Kidneys are normal. No cyst, mass, stone or hydronephrosis. Stomach/Bowel: Redemonstration diffuse colitis with wall thickening from the cecum to the rectum. No evidence of perforation. Small bowel appears normal. Vascular/Lymphatic: Aortic atherosclerosis. No aneurysm. IVC is normal. No retroperitoneal adenopathy. Reproductive: Normal Other: No free fluid or air. Musculoskeletal: Negative IMPRESSION: Redemonstration of diffuse wall thickening with mild surrounding inflammatory change throughout the colon consistent with diffuse colitis. No sign of perforation or ascites. Electronically Signed   By: Paulina Fusi M.D.   On: 12/15/2017 16:53    EKG: none  Assessment/Plan Active Problems:   C. difficile colitis  C. difficile colitis- first recurrence of loose stools with abdominal pain, within 2 weeks of completing treatment.  Initially treated as outpt with  Metronidazole X 10 days, hadnt f/u with PCP or GI. WBC- 9.9. Cr- 1.09. Ct abd- pancolitis.  Repeat C. difficile in the ED positive.  Normal electrolytes - For 1st recurrence,  treated with p.o. Vancomycin - Probiotics - IVF N/s 100cc/hr X 12hrs -Enteric precautions - BMp a.m - Bowel rest, with clear liquid diet - Morphine for pain  HIV as part of routine health screen.  DVT prophylaxis: Lovenox Code Status: Full  Family Communication: None at beside Disposition Plan: 2-3 days Consults called: None  Admission status: Obs, med surg   Onnie Boer MD [facility] Pager [phone]  If 7PM-7AM, please contact night-coverage [email or web address] Password [facility]  12/15/2017, 7:27 PM

## 2017-12-16 DIAGNOSIS — A0472 Enterocolitis due to Clostridium difficile, not specified as recurrent: Principal | ICD-10-CM

## 2017-12-16 DIAGNOSIS — Z9049 Acquired absence of other specified parts of digestive tract: Secondary | ICD-10-CM | POA: Diagnosis not present

## 2017-12-16 DIAGNOSIS — D682 Hereditary deficiency of other clotting factors: Secondary | ICD-10-CM

## 2017-12-16 DIAGNOSIS — Z79891 Long term (current) use of opiate analgesic: Secondary | ICD-10-CM | POA: Diagnosis not present

## 2017-12-16 DIAGNOSIS — Z9852 Vasectomy status: Secondary | ICD-10-CM | POA: Diagnosis not present

## 2017-12-16 DIAGNOSIS — I10 Essential (primary) hypertension: Secondary | ICD-10-CM

## 2017-12-16 DIAGNOSIS — Z832 Family history of diseases of the blood and blood-forming organs and certain disorders involving the immune mechanism: Secondary | ICD-10-CM | POA: Diagnosis not present

## 2017-12-16 DIAGNOSIS — Z79899 Other long term (current) drug therapy: Secondary | ICD-10-CM | POA: Diagnosis not present

## 2017-12-16 DIAGNOSIS — I959 Hypotension, unspecified: Secondary | ICD-10-CM | POA: Diagnosis present

## 2017-12-16 DIAGNOSIS — Z888 Allergy status to other drugs, medicaments and biological substances status: Secondary | ICD-10-CM | POA: Diagnosis not present

## 2017-12-16 DIAGNOSIS — D6851 Activated protein C resistance: Secondary | ICD-10-CM | POA: Diagnosis present

## 2017-12-16 DIAGNOSIS — Z6824 Body mass index (BMI) 24.0-24.9, adult: Secondary | ICD-10-CM | POA: Diagnosis not present

## 2017-12-16 DIAGNOSIS — Z87891 Personal history of nicotine dependence: Secondary | ICD-10-CM | POA: Diagnosis not present

## 2017-12-16 DIAGNOSIS — R630 Anorexia: Secondary | ICD-10-CM | POA: Diagnosis present

## 2017-12-16 DIAGNOSIS — Z86718 Personal history of other venous thrombosis and embolism: Secondary | ICD-10-CM | POA: Diagnosis not present

## 2017-12-16 DIAGNOSIS — E7800 Pure hypercholesterolemia, unspecified: Secondary | ICD-10-CM | POA: Diagnosis present

## 2017-12-16 DIAGNOSIS — R011 Cardiac murmur, unspecified: Secondary | ICD-10-CM | POA: Diagnosis present

## 2017-12-16 DIAGNOSIS — G8929 Other chronic pain: Secondary | ICD-10-CM | POA: Diagnosis present

## 2017-12-16 LAB — BASIC METABOLIC PANEL
Anion gap: 10 (ref 5–15)
BUN: 9 mg/dL (ref 6–20)
CO2: 21 mmol/L — AB (ref 22–32)
Calcium: 8.5 mg/dL — ABNORMAL LOW (ref 8.9–10.3)
Chloride: 107 mmol/L (ref 101–111)
Creatinine, Ser: 0.83 mg/dL (ref 0.61–1.24)
GFR calc Af Amer: >60 mL/min (ref >60)
GLUCOSE: 150 mg/dL — AB (ref 65–99)
POTASSIUM: 3.7 mmol/L (ref 3.5–5.1)
Sodium: 138 mmol/L (ref 135–145)

## 2017-12-16 MED ORDER — ONDANSETRON HCL 4 MG PO TABS
4.0000 mg | ORAL_TABLET | ORAL | Status: DC
  Administered 2017-12-17: 4 mg via ORAL
  Filled 2017-12-16: qty 1

## 2017-12-16 MED ORDER — HYDROCODONE-ACETAMINOPHEN 10-325 MG PO TABS
1.0000 | ORAL_TABLET | Freq: Four times a day (QID) | ORAL | Status: DC
  Administered 2017-12-16 – 2017-12-18 (×8): 1 via ORAL
  Filled 2017-12-16 (×8): qty 1

## 2017-12-16 MED ORDER — ACETAMINOPHEN 325 MG PO TABS: 650.0000 mg | ORAL_TABLET | Freq: Four times a day (QID) | ORAL | Status: DC | PRN

## 2017-12-16 MED ORDER — ONDANSETRON HCL 4 MG/2ML IJ SOLN
4.0000 mg | INTRAMUSCULAR | Status: DC
  Administered 2017-12-16 – 2017-12-17 (×5): 4 mg via INTRAVENOUS
  Filled 2017-12-16 (×5): qty 2

## 2017-12-16 MED ORDER — HYDROMORPHONE HCL 1 MG/ML IJ SOLN
0.5000 mg | INTRAMUSCULAR | Status: DC | PRN
  Administered 2017-12-16: 1 mg via INTRAVENOUS
  Administered 2017-12-17: 0.5 mg via INTRAVENOUS
  Administered 2017-12-17 – 2017-12-18 (×3): 1 mg via INTRAVENOUS
  Filled 2017-12-16 (×6): qty 1

## 2017-12-16 MED ORDER — HYDROMORPHONE HCL 1 MG/ML IJ SOLN
1.0000 mg | INTRAMUSCULAR | Status: DC | PRN
Start: 2017-12-16 — End: 2017-12-16
  Administered 2017-12-16 (×2): 1 mg via INTRAVENOUS
  Filled 2017-12-16 (×2): qty 1

## 2017-12-16 MED ORDER — ONDANSETRON HCL 4 MG/2ML IJ SOLN: 4.0000 mg | Freq: Four times a day (QID) | INTRAMUSCULAR | Status: DC

## 2017-12-16 MED ORDER — ONDANSETRON HCL 4 MG PO TABS: 4.0000 mg | ORAL_TABLET | Freq: Four times a day (QID) | ORAL | Status: DC

## 2017-12-16 NOTE — Progress Notes (Addendum)
PROGRESS NOTE    Murlin SIGISMUND CROSS   ZOX:[id number]  DOB: 1971/09/19  DOA: 12/15/2017 PCP: Elfredia Nevins, MD   Brief Narrative:    Preston Davis is a 47 y.o. male with medical history significant for HTN, DVT, factor 5 leiden heterozygous, Recent C. Diff colitis, who just completed 10 day course of treatment with metronidazole. He states that although he got better, his loose stools never completely went away. He developed severe abdominal pain and watery diarrhea again on Thursday. He has had chills but no fever. No blood in stool. CT in ER reveals extensive colitis and C diff is +.   Subjective: Pain is severe and constant across mid abdomen. Has has 3-4 watery stools overnight. He is nauseated but not vomiting.     Assessment & Plan:   Active Problems:   C. difficile colitis - previously treated with Flagyl and has failed this treatment - cont Vancomycin - have adjusted pain medications and nausea medications today to allow him to be more comfortable - cont IVF - clear liquids    HTN (hypertension) - cont Bystolic  DVT prophylaxis: Lovenox Code Status: Full code Family Communication:  Disposition Plan: home in 1-2 days Consultants:   none Procedures:   none Antimicrobials:  Anti-infectives (From admission, onward)   Start     Dose/Rate Route Frequency Ordered Stop   12/15/17 1930  vancomycin (VANCOCIN) 50 mg/mL oral solution 125 mg     125 mg Oral Every 6 hours 12/15/17 1927     12/15/17 1700  metroNIDAZOLE (FLAGYL) IVPB 500 mg     500 mg 100 mL/hr over 60 Minutes Intravenous  Once 12/15/17 1658 12/15/17 1835   12/15/17 1700  metroNIDAZOLE (FLAGYL) tablet 500 mg     500 mg Oral  Once 12/15/17 1658 12/15/17 1738       Objective: Vitals:   12/15/17 1909 12/15/17 1930 12/15/17 2015 12/16/17 0601  BP:   128/78 129/76  Pulse:  66 75 79  Resp:   16 15  Temp:   97.6 F (36.4 C) 99.3 F (37.4 C)  TempSrc:   Oral Oral  SpO2:   100% 97%  Weight: 77.1 kg (170 lb)   72.7 kg (160 lb 3.2 oz)   Height: 5\' 8"  (1.727 m)  5\' 8"  (1.727 m)     Intake/Output Summary (Last 24 hours) at 12/16/2017 1112 Last data filed at 12/16/2017 0602 Gross per 24 hour  Intake -  Output 2 ml  Net -2 ml   Filed Weights   12/15/17 1909 12/15/17 2015  Weight: 77.1 kg (170 lb) 72.7 kg (160 lb 3.2 oz)    Examination: General exam: Appears comfortable  HEENT: PERRLA, oral mucosa moist, no sclera icterus or thrush Respiratory system: Clear to auscultation. Respiratory effort normal. Cardiovascular system: S1 & S2 heard, RRR.  No murmurs  Gastrointestinal system: Abdomen soft, tender diffuses across upper and mid abdomen, nondistended. Normal bowel sound. No organomegaly Central nervous system: Alert and oriented. No focal neurological deficits. Extremities: No cyanosis, clubbing or edema Skin: No rashes or ulcers Psychiatry:  Mood & affect appropriate.     Data Reviewed: I have personally reviewed following labs and imaging studies  CBC: Recent Labs  Lab 12/15/17 1312  WBC 9.9  HGB 13.1  HCT 41.7  MCV 87.1  PLT 218   Basic Metabolic Panel: Recent Labs  Lab 12/15/17 1312 12/16/17 0619  NA 137 138  K 4.1 3.7  CL 103 107  CO2 22  21*  GLUCOSE 157* 150*  BUN 15 9  CREATININE 1.09 0.83  CALCIUM 8.7* 8.5*   GFR: Estimated Creatinine Clearance: 105.3 mL/min (by C-G formula based on SCr of 0.83 mg/dL). Liver Function Tests: Recent Labs  Lab 12/15/17 1312  AST 28  ALT 35  ALKPHOS 71  BILITOT 0.9  PROT 6.4*  ALBUMIN 3.7   Recent Labs  Lab 12/15/17 1312  LIPASE 32   No results for input(s): AMMONIA in the last 168 hours. Coagulation Profile: No results for input(s): INR, PROTIME in the last 168 hours. Cardiac Enzymes: No results for input(s): CKTOTAL, CKMB, CKMBINDEX, TROPONINI in the last 168 hours. BNP (last 3 results) No results for input(s): PROBNP in the last 8760 hours. HbA1C: No results for input(s): HGBA1C in the last 72  hours. CBG: No results for input(s): GLUCAP in the last 168 hours. Lipid Profile: No results for input(s): CHOL, HDL, LDLCALC, TRIG, CHOLHDL, LDLDIRECT in the last 72 hours. Thyroid Function Tests: No results for input(s): TSH, T4TOTAL, FREET4, T3FREE, THYROIDAB in the last 72 hours. Anemia Panel: No results for input(s): VITAMINB12, FOLATE, FERRITIN, TIBC, IRON, RETICCTPCT in the last 72 hours. Urine analysis:    Component Value Date/Time   COLORURINE YELLOW 12/15/2017 1513   APPEARANCEUR CLEAR 12/15/2017 1513   LABSPEC 1.018 12/15/2017 1513   PHURINE 6.0 12/15/2017 1513   GLUCOSEU NEGATIVE 12/15/2017 1513   HGBUR NEGATIVE 12/15/2017 1513   BILIRUBINUR NEGATIVE 12/15/2017 1513   KETONESUR NEGATIVE 12/15/2017 1513   PROTEINUR NEGATIVE 12/15/2017 1513   UROBILINOGEN 0.2 05/14/2013 0611   NITRITE NEGATIVE 12/15/2017 1513   LEUKOCYTESUR NEGATIVE 12/15/2017 1513   Sepsis Labs: @LABRCNTIP (procalcitonin:4,lacticidven:4) ) Recent Results (from the past 240 hour(s))  C difficile quick scan w PCR reflex     Status: Abnormal   Collection Time: 12/15/17  3:10 PM  Result Value Ref Range Status   C Diff antigen POSITIVE (A) NEGATIVE Final   C Diff toxin POSITIVE (A) NEGATIVE Final   C Diff interpretation Toxin producing C. difficile detected.  Final    Comment: CRITICAL RESULT CALLED TO, READ BACK BY AND VERIFIED WITH: GENTRY,R. 12/15/2017 AT 1645 BY EVA          Radiology Studies: Ct Abdomen Pelvis W Contrast  Result Date: 12/15/2017 CLINICAL DATA:  Diarrhea.  Recent diagnosis of colitis. EXAM: CT ABDOMEN AND PELVIS WITH CONTRAST TECHNIQUE: Multidetector CT imaging of the abdomen and pelvis was performed using the standard protocol following bolus administration of intravenous contrast. CONTRAST:  ISOVUE-300 IOPAMIDOL (ISOVUE-300) INJECTION 61% COMPARISON:  11/28/2017 FINDINGS: Lower chest: Normal Hepatobiliary: Normal Pancreas: Normal Spleen: Normal Adrenals/Urinary Tract:  Adrenal glands are normal. Kidneys are normal. No cyst, mass, stone or hydronephrosis. Stomach/Bowel: Redemonstration diffuse colitis with wall thickening from the cecum to the rectum. No evidence of perforation. Small bowel appears normal. Vascular/Lymphatic: Aortic atherosclerosis. No aneurysm. IVC is normal. No retroperitoneal adenopathy. Reproductive: Normal Other: No free fluid or air. Musculoskeletal: Negative IMPRESSION: Redemonstration of diffuse wall thickening with mild surrounding inflammatory change throughout the colon consistent with diffuse colitis. No sign of perforation or ascites. Electronically Signed   By: Paulina Fusi M.D.   On: 12/15/2017 16:53      Scheduled Meds: . enoxaparin (LOVENOX) injection  40 mg Subcutaneous Q24H  . HYDROcodone-acetaminophen  1 tablet Oral QID  . lactobacillus acidophilus  2 tablet Oral TID  . nebivolol  5 mg Oral Daily  . ondansetron  4 mg Oral Q4H   Or  . ondansetron (  ZOFRAN) IV  4 mg Intravenous Q4H  . pantoprazole  40 mg Oral Daily  . simvastatin  40 mg Oral q1800  . vancomycin  125 mg Oral Q6H   Continuous Infusions:   LOS: 0 days    Time spent in minutes: 35    Calvert CantorSaima , MD [facility] Pager: [email or web address] Password [email or web address] 12/16/2017, 11:12 AM

## 2017-12-16 NOTE — Progress Notes (Signed)
Reported 3 loose stools this morning as well as nausea and abd pain

## 2017-12-16 NOTE — Progress Notes (Signed)
Educated pt and spouse on infection prevention and advised spouse to wear gown, wash with soap and water, and use department restroom instead of pt restroom to prevent transmission. Spouse understands instruction but states "I live with him, I would've caught it by now." Will continue to educate.

## 2017-12-17 DIAGNOSIS — D682 Hereditary deficiency of other clotting factors: Secondary | ICD-10-CM

## 2017-12-17 LAB — BASIC METABOLIC PANEL
ANION GAP: 9 (ref 5–15)
BUN: 10 mg/dL (ref 6–20)
CHLORIDE: 105 mmol/L (ref 101–111)
CO2: 25 mmol/L (ref 22–32)
Calcium: 8.4 mg/dL — ABNORMAL LOW (ref 8.9–10.3)
Creatinine, Ser: 0.87 mg/dL (ref 0.61–1.24)
GFR calc non Af Amer: >60 mL/min (ref >60)
Glucose, Bld: 112 mg/dL — ABNORMAL HIGH (ref 65–99)
Potassium: 3.7 mmol/L (ref 3.5–5.1)
Sodium: 139 mmol/L (ref 135–145)

## 2017-12-17 LAB — CBC
HCT: 39 % (ref 39.0–52.0)
HEMOGLOBIN: 12.2 g/dL — AB (ref 13.0–17.0)
MCH: 27.3 pg (ref 26.0–34.0)
MCHC: 31.3 g/dL (ref 30.0–36.0)
MCV: 87.2 fL (ref 78.0–100.0)
Platelets: 207 10*3/uL (ref 150–400)
RBC: 4.47 MIL/uL (ref 4.22–5.81)
RDW: 14.3 % (ref 11.5–15.5)
WBC: 6.8 10*3/uL (ref 4.0–10.5)

## 2017-12-17 LAB — HIV ANTIBODY (ROUTINE TESTING W REFLEX): HIV SCREEN 4TH GENERATION: NONREACTIVE

## 2017-12-17 MED ORDER — POTASSIUM CHLORIDE IN NACL 20-0.9 MEQ/L-% IV SOLN
INTRAVENOUS | Status: DC
  Administered 2017-12-17 – 2017-12-18 (×2): via INTRAVENOUS

## 2017-12-17 MED ORDER — VANCOMYCIN 50 MG/ML ORAL SOLUTION: 125.0000 mg | Freq: Four times a day (QID) | ORAL | 0 refills | Status: DC

## 2017-12-17 MED ORDER — ONDANSETRON HCL 4 MG PO TABS: 4.0000 mg | ORAL_TABLET | Freq: Four times a day (QID) | ORAL | Status: DC | PRN

## 2017-12-17 MED ORDER — BACID PO TABS: 2.0000 | ORAL_TABLET | Freq: Three times a day (TID) | ORAL | 0 refills | Status: DC

## 2017-12-17 MED ORDER — ONDANSETRON HCL 4 MG/2ML IJ SOLN: 4.0000 mg | Freq: Four times a day (QID) | INTRAMUSCULAR | Status: DC | PRN

## 2017-12-17 MED ORDER — DICYCLOMINE HCL 10 MG PO CAPS
20.0000 mg | ORAL_CAPSULE | Freq: Three times a day (TID) | ORAL | Status: DC
  Administered 2017-12-17 – 2017-12-18 (×5): 20 mg via ORAL
  Filled 2017-12-17 (×5): qty 2

## 2017-12-17 NOTE — Discharge Instructions (Signed)
Avoid antibiotics over the next 3-4 months.  Advance you diet slowly over the next few days.  If at any point in your treatment of after you have stopped the Vancomycin, diarrhea or abdominal pain recurs, please call your PCP.   Please take all your medications with you for your next visit with your Primary MD. Please request your Primary MD to go over all hospital test results at the follow up. Please ask your Primary MD to get all Hospital records sent to his/her office.  If you experience worsening of your admission symptoms, develop shortness of breath, chest pain, suicidal or homicidal thoughts or a life threatening emergency, you must seek medical attention immediately by calling 911 or calling your MD.  Bonita QuinYou must read the complete instructions/literature along with all the possible adverse reactions/side effects for all the medicines you take including new medications that have been prescribed to you. Take new medicines after you have completely understood and accpet all the possible adverse reactions/side effects.   Do not drive when taking pain medications or sedatives.    Do not take more than prescribed Pain, Sleep and Anxiety Medications  If you have smoked or chewed Tobacco in the last 2 yrs please stop. Stop any regular alcohol and or recreational drug use.  Wear Seat belts while driving.

## 2017-12-17 NOTE — Discharge Summary (Addendum)
Physician Discharge Summary  Preston Davis ZOX:[id number] DOB: 10/23/1971 DOA: 12/15/2017  PCP: Preston Davis  Admit date: 12/15/2017 Discharge date: 12/18/2017  Admitted From: home  Disposition:  home   Discharge Condition:  stable   CODE STATUS:  Full code   Consultations:  none    Discharge Diagnoses:  Active Problems:   C. difficile colitis   HTN (hypertension)    Subjective: Diarrhea is resolving. Abdominal pain and nausea has resolved.   Brief Summary: Preston Davis a 46 y.o.malewith medical history significantfor HTN, DVT, factor 5 leiden heterozygous, Recent C. Diff colitis, who just completed 10 day course of treatmentwith metronidazole. He states that although he got better, his loose stools never completely went away. He developed severe abdominal pain and watery diarrhea again on Thursday. He has had chills but no fever. No blood in stool. CT in ER reveals extensive colitis and C diff is +.   Hospital Course:  Active Problems:   C. difficile colitis - previously treated with Flagyl and has failed this treatment - cont Vancomycin for a total of 2 wks - advance diet slowly at home - avoid all antibiotics for at least 3-4 months if possible - cont Lactobacillus and Bentyl    HTN (hypertension) - cont Bystolic  Chronic pain - I have not changed the doses of his narcotics or prescribed any new ones  Discharge Exam: Vitals:   12/17/17 2135 12/18/17 0510  BP: (!) 104/58 106/65  Pulse: (!) 50 (!) 59  Resp: 16 16  Temp: 98.4 F (36.9 C) 98.5 F (36.9 C)  SpO2: 97% 95%   Vitals:   12/17/17 0601 12/17/17 1500 12/17/17 2135 12/18/17 0510  BP: 123/75 (!) 96/59 (!) 104/58 106/65  Pulse: 65 (!) 52 (!) 50 (!) 59  Resp: 18 16 16 16   Temp: 98.7 F (37.1 C) 99.3 F (37.4 C) 98.4 F (36.9 C) 98.5 F (36.9 C)  TempSrc: Oral Oral Oral Oral  SpO2: 93% 95% 97% 95%  Weight:      Height:        General: Pt is alert, awake, not in acute  distress Cardiovascular: RRR, S1/S2 +, no rubs, no gallops Respiratory: CTA bilaterally, no wheezing, no rhonchi Abdominal: Soft, NT, ND, bowel sounds + Extremities: no edema, no cyanosis   Discharge Instructions  Discharge Instructions    Diet general   Complete by:  As directed    Heart healthy diet- low fiber for 1wk and then slowly re-introduce fiber   Increase activity slowly   Complete by:  As directed      Allergies as of 12/18/2017      Reactions   Benicar [olmesartan] Other (See Comments)   REACTION: Unable to regulate blood pressure with this medication--caused BP levels to drop too low.      Medication List    STOP taking these medications   amoxicillin-clavulanate 875-125 MG tablet Commonly known as:  AUGMENTIN     TAKE these medications   dicyclomine 10 MG capsule Commonly known as:  BENTYL Take 2 capsules (20 mg total) by mouth 4 (four) times daily -  before meals and at bedtime.   HYDROcodone-acetaminophen 10-325 MG tablet Commonly known as:  NORCO Take 1 tablet by mouth 4 (four) times daily.   HYDROcodone-acetaminophen 5-325 MG tablet Commonly known as:  NORCO/VICODIN Take 1-2 tablets by mouth every 6 (six) hours as needed.   lactobacillus acidophilus Tabs tablet Take 2 tablets by mouth 3 (three) times daily.  nebivolol 5 MG tablet Commonly known as:  BYSTOLIC Take 5 mg by mouth daily.   sildenafil 100 MG tablet Commonly known as:  VIAGRA Take 50-100 mg by mouth daily as needed for erectile dysfunction. Takes 1/2 tablet as needed   simvastatin 40 MG tablet Commonly known as:  ZOCOR Take 40 mg by mouth daily.   vancomycin 50 mg/mL oral solution Commonly known as:  VANCOCIN Take 2.5 mLs (125 mg total) by mouth every 6 (six) hours.   zolpidem 10 MG tablet Commonly known as:  AMBIEN Take 5-10 mg by mouth at bedtime as needed for sleep.      Follow-up Information    Preston Nevins, Davis Follow up in 1 week(s).   Specialty:  Internal  Medicine Contact information: [address] [place] [address] [phone]          Allergies  Allergen Reactions  . Benicar [Olmesartan] Other (See Comments)    REACTION: Unable to regulate blood pressure with this medication--caused BP levels to drop too low.     Procedures/Studies:   Ct Abdomen Pelvis W Contrast  Result Date: 12/15/2017 CLINICAL DATA:  Diarrhea.  Recent diagnosis of colitis. EXAM: CT ABDOMEN AND PELVIS WITH CONTRAST TECHNIQUE: Multidetector CT imaging of the abdomen and pelvis was performed using the standard protocol following bolus administration of intravenous contrast. CONTRAST:  ISOVUE-300 IOPAMIDOL (ISOVUE-300) INJECTION 61% COMPARISON:  11/28/2017 FINDINGS: Lower chest: Normal Hepatobiliary: Normal Pancreas: Normal Spleen: Normal Adrenals/Urinary Tract: Adrenal glands are normal. Kidneys are normal. No cyst, mass, stone or hydronephrosis. Stomach/Bowel: Redemonstration diffuse colitis with wall thickening from the cecum to the rectum. No evidence of perforation. Small bowel appears normal. Vascular/Lymphatic: Aortic atherosclerosis. No aneurysm. IVC is normal. No retroperitoneal adenopathy. Reproductive: Normal Other: No free fluid or air. Musculoskeletal: Negative IMPRESSION: Redemonstration of diffuse wall thickening with mild surrounding inflammatory change throughout the colon consistent with diffuse colitis. No sign of perforation or ascites. Electronically Signed   By: Paulina Fusi M.D.   On: 12/15/2017 16:53   Ct Abdomen Pelvis W Contrast  Result Date: 11/28/2017 CLINICAL DATA:  47 year old male with abdominal infection. EXAM: CT ABDOMEN AND PELVIS WITH CONTRAST TECHNIQUE: Multidetector CT imaging of the abdomen and pelvis was performed using the standard protocol following bolus administration of intravenous contrast. CONTRAST:  ISOVUE-300 IOPAMIDOL (ISOVUE-300) INJECTION 61% COMPARISON:  Lumbar spine MRI dated 08/13/2014 FINDINGS:  Lower chest: The visualized lung bases are clear. No intra-abdominal free air or free fluid. Hepatobiliary: No focal liver abnormality is seen. No gallstones, gallbladder wall thickening, or biliary dilatation. Pancreas: Unremarkable. No pancreatic ductal dilatation or surrounding inflammatory changes. Spleen: Normal in size without focal abnormality. Adrenals/Urinary Tract: Adrenal glands are unremarkable. Kidneys are normal, without renal calculi, focal lesion, or hydronephrosis. Bladder is unremarkable. Stomach/Bowel: There is diffuse thickened and inflamed appearance of the colon consistent with pancolitis. Findings may be related to Celsius diff colitis. Correlation with history of recent antibiotic use as well as with stool culture recommended. There is no bowel obstruction. Appendectomy. Vascular/Lymphatic: Moderate aortoiliac atherosclerotic disease. The SMV, splenic vein, and main portal vein are patent. No portal venous gas. There is no adenopathy. Reproductive: The prostate and seminal vesicles are grossly unremarkable. Other: Small fat containing umbilical hernia. Musculoskeletal: No acute or significant osseous findings. IMPRESSION: 1. Pancolitis suspicious for pseudomembranous colitis. Correlation with stool cultures as well as with history of recent antibiotic use recommended. 2.  Aortic Atherosclerosis (ICD10-I70.0). Electronically Signed   By: Ceasar Mons.D.  On: 11/28/2017 22:07     The results of significant diagnostics from this hospitalization (including imaging, microbiology, ancillary and laboratory) are listed below for reference.     Microbiology: Recent Results (from the past 240 hour(s))  C difficile quick scan w PCR reflex     Status: Abnormal   Collection Time: 12/15/17  3:10 PM  Result Value Ref Range Status   C Diff antigen POSITIVE (A) NEGATIVE Final   C Diff toxin POSITIVE (A) NEGATIVE Final   C Diff interpretation Toxin producing C. difficile detected.  Final     Comment: CRITICAL RESULT CALLED TO, READ BACK BY AND VERIFIED WITH: GENTRY,R. 12/15/2017 AT 1645 BY EVA      Labs: BNP (last 3 results) No results for input(s): BNP in the last 8760 hours. Basic Metabolic Panel: Recent Labs  Lab 12/15/17 1312 12/16/17 0619 12/17/17 0612  NA 137 138 139  K 4.1 3.7 3.7  CL 103 107 105  CO2 22 21* 25  GLUCOSE 157* 150* 112*  BUN 15 9 10   CREATININE 1.09 0.83 0.87  CALCIUM 8.7* 8.5* 8.4*   Liver Function Tests: Recent Labs  Lab 12/15/17 1312  AST 28  ALT 35  ALKPHOS 71  BILITOT 0.9  PROT 6.4*  ALBUMIN 3.7   Recent Labs  Lab 12/15/17 1312  LIPASE 32   No results for input(s): AMMONIA in the last 168 hours. CBC: Recent Labs  Lab 12/15/17 1312 12/17/17 0612  WBC 9.9 6.8  HGB 13.1 12.2*  HCT 41.7 39.0  MCV 87.1 87.2  PLT 218 207   Cardiac Enzymes: No results for input(s): CKTOTAL, CKMB, CKMBINDEX, TROPONINI in the last 168 hours. BNP: Invalid input(s): POCBNP CBG: No results for input(s): GLUCAP in the last 168 hours. D-Dimer No results for input(s): DDIMER in the last 72 hours. Hgb A1c No results for input(s): HGBA1C in the last 72 hours. Lipid Profile No results for input(s): CHOL, HDL, LDLCALC, TRIG, CHOLHDL, LDLDIRECT in the last 72 hours. Thyroid function studies No results for input(s): TSH, T4TOTAL, T3FREE, THYROIDAB in the last 72 hours.  Invalid input(s): FREET3 Anemia work up No results for input(s): VITAMINB12, FOLATE, FERRITIN, TIBC, IRON, RETICCTPCT in the last 72 hours. Urinalysis    Component Value Date/Time   COLORURINE YELLOW 12/15/2017 1513   APPEARANCEUR CLEAR 12/15/2017 1513   LABSPEC 1.018 12/15/2017 1513   PHURINE 6.0 12/15/2017 1513   GLUCOSEU NEGATIVE 12/15/2017 1513   HGBUR NEGATIVE 12/15/2017 1513   BILIRUBINUR NEGATIVE 12/15/2017 1513   KETONESUR NEGATIVE 12/15/2017 1513   PROTEINUR NEGATIVE 12/15/2017 1513   UROBILINOGEN 0.2 05/14/2013 0611   NITRITE NEGATIVE 12/15/2017 1513    LEUKOCYTESUR NEGATIVE 12/15/2017 1513   Sepsis Labs Invalid input(s): PROCALCITONIN,  WBC,  LACTICIDVEN Microbiology Recent Results (from the past 240 hour(s))  C difficile quick scan w PCR reflex     Status: Abnormal   Collection Time: 12/15/17  3:10 PM  Result Value Ref Range Status   C Diff antigen POSITIVE (A) NEGATIVE Final   C Diff toxin POSITIVE (A) NEGATIVE Final   C Diff interpretation Toxin producing C. difficile detected.  Final    Comment: CRITICAL RESULT CALLED TO, READ BACK BY AND VERIFIED WITH: GENTRY,R. 12/15/2017 AT 1645 BY EVA      Time coordinating discharge: Over 30 minutes  SIGNED:   Calvert Cantor, Davis  [facility] 12/18/2017, 12:00 PM Pager   If 7PM-7AM, please contact night-coverage [email or web address] Password [email or web address]

## 2017-12-17 NOTE — Progress Notes (Signed)
Discharge order cancelled due to patient's pain level , especially after having bm.  Was started on bentyl and IV fluids.  Reports that bms are still loose but the amount has decreased. Wife has been at bedside. She was given script for vancomycin and says she has pain meds at home.

## 2017-12-17 NOTE — Progress Notes (Signed)
PROGRESS NOTE    Brysan Sharen HeckJ Scotti   ZOX:[id number]  DOB: 06/08/1971  DOA: 12/15/2017 PCP: Elfredia NevinsFusco, Lawrence, MD   Brief Narrative:    Preston Davis is a 47 y.o. male with medical history significant for HTN, DVT, factor 5 leiden heterozygous, Recent C. Diff colitis, who just completed 10 day course of treatment with metronidazole. He states that although he got better, his loose stools never completely went away. He developed severe abdominal pain and watery diarrhea again on Thursday. He has had chills but no fever. No blood in stool. CT in ER reveals extensive colitis and C diff is +.   Subjective: Pain and diarrhea have improved. He is still having cramps before and after a BM however and needs the IV pain medications. No vomiting.     Assessment & Plan:   Active Problems:   C. difficile colitis - previously treated with Flagyl and has failed this treatment - cont Vancomycin - cont pain medications and nausea medications   - cont IVF - can change clears to full liquids - add Bentyl for cramping    HTN (hypertension) - cont Bystolic  DVT prophylaxis: Lovenox Code Status: Full code Family Communication:  Disposition Plan: home in 1-2 days Consultants:   none Procedures:   none Antimicrobials:  Anti-infectives (From admission, onward)   Start     Dose/Rate Route Frequency Ordered Stop   12/17/17 0000  vancomycin (VANCOCIN) 50 mg/mL oral solution  Status:  Discontinued     125 mg Oral Every 6 hours 12/17/17 1022 12/17/17    12/17/17 0000  vancomycin (VANCOCIN) 50 mg/mL oral solution     125 mg Oral Every 6 hours 12/17/17 1039     12/15/17 1930  vancomycin (VANCOCIN) 50 mg/mL oral solution 125 mg     125 mg Oral Every 6 hours 12/15/17 1927     12/15/17 1700  metroNIDAZOLE (FLAGYL) IVPB 500 mg     500 mg 100 mL/hr over 60 Minutes Intravenous  Once 12/15/17 1658 12/15/17 1835   12/15/17 1700  metroNIDAZOLE (FLAGYL) tablet 500 mg     500 mg Oral  Once 12/15/17 1658 12/15/17  1738       Objective: Vitals:   12/16/17 0601 12/16/17 1324 12/16/17 2039 12/17/17 0601  BP: 129/76 110/71 112/74 123/75  Pulse: 79 74 64 65  Resp: 15 16 16 18   Temp: 99.3 F (37.4 C) 98.4 F (36.9 C) 99 F (37.2 C) 98.7 F (37.1 C)  TempSrc: Oral Oral Oral Oral  SpO2: 97% 98% 95% 93%  Weight:      Height:        Intake/Output Summary (Last 24 hours) at 12/17/2017 1129 Last data filed at 12/16/2017 1700 Gross per 24 hour  Intake 480 ml  Output -  Net 480 ml   Filed Weights   12/15/17 1909 12/15/17 2015  Weight: 77.1 kg (170 lb) 72.7 kg (160 lb 3.2 oz)    Examination: General exam: Appears comfortable  HEENT: PERRLA, oral mucosa moist, no sclera icterus or thrush Respiratory system: Clear to auscultation. Respiratory effort normal. Cardiovascular system: S1 & S2 heard, RRR.  No murmurs  Gastrointestinal system: Abdomen soft, mild tenderness diffusely across upper and mid abdomen, nondistended. Normal bowel sound. No organomegaly Central nervous system: Alert and oriented. No focal neurological deficits. Extremities: No cyanosis, clubbing or edema Skin: No rashes or ulcers Psychiatry:  Mood & affect appropriate.     Data Reviewed: I have personally reviewed following labs and  imaging studies  CBC: Recent Labs  Lab 12/15/17 1312 12/17/17 0612  WBC 9.9 6.8  HGB 13.1 12.2*  HCT 41.7 39.0  MCV 87.1 87.2  PLT 218 207   Basic Metabolic Panel: Recent Labs  Lab 12/15/17 1312 12/16/17 0619 12/17/17 0612  NA 137 138 139  K 4.1 3.7 3.7  CL 103 107 105  CO2 22 21* 25  GLUCOSE 157* 150* 112*  BUN 15 9 10   CREATININE 1.09 0.83 0.87  CALCIUM 8.7* 8.5* 8.4*   GFR: Estimated Creatinine Clearance: 100.5 mL/min (by C-G formula based on SCr of 0.87 mg/dL). Liver Function Tests: Recent Labs  Lab 12/15/17 1312  AST 28  ALT 35  ALKPHOS 71  BILITOT 0.9  PROT 6.4*  ALBUMIN 3.7   Recent Labs  Lab 12/15/17 1312  LIPASE 32   No results for input(s):  AMMONIA in the last 168 hours. Coagulation Profile: No results for input(s): INR, PROTIME in the last 168 hours. Cardiac Enzymes: No results for input(s): CKTOTAL, CKMB, CKMBINDEX, TROPONINI in the last 168 hours. BNP (last 3 results) No results for input(s): PROBNP in the last 8760 hours. HbA1C: No results for input(s): HGBA1C in the last 72 hours. CBG: No results for input(s): GLUCAP in the last 168 hours. Lipid Profile: No results for input(s): CHOL, HDL, LDLCALC, TRIG, CHOLHDL, LDLDIRECT in the last 72 hours. Thyroid Function Tests: No results for input(s): TSH, T4TOTAL, FREET4, T3FREE, THYROIDAB in the last 72 hours. Anemia Panel: No results for input(s): VITAMINB12, FOLATE, FERRITIN, TIBC, IRON, RETICCTPCT in the last 72 hours. Urine analysis:    Component Value Date/Time   COLORURINE YELLOW 12/15/2017 1513   APPEARANCEUR CLEAR 12/15/2017 1513   LABSPEC 1.018 12/15/2017 1513   PHURINE 6.0 12/15/2017 1513   GLUCOSEU NEGATIVE 12/15/2017 1513   HGBUR NEGATIVE 12/15/2017 1513   BILIRUBINUR NEGATIVE 12/15/2017 1513   KETONESUR NEGATIVE 12/15/2017 1513   PROTEINUR NEGATIVE 12/15/2017 1513   UROBILINOGEN 0.2 05/14/2013 0611   NITRITE NEGATIVE 12/15/2017 1513   LEUKOCYTESUR NEGATIVE 12/15/2017 1513   Sepsis Labs: @LABRCNTIP (procalcitonin:4,lacticidven:4) ) Recent Results (from the past 240 hour(s))  C difficile quick scan w PCR reflex     Status: Abnormal   Collection Time: 12/15/17  3:10 PM  Result Value Ref Range Status   C Diff antigen POSITIVE (A) NEGATIVE Final   C Diff toxin POSITIVE (A) NEGATIVE Final   C Diff interpretation Toxin producing C. difficile detected.  Final    Comment: CRITICAL RESULT CALLED TO, READ BACK BY AND VERIFIED WITH: GENTRY,R. 12/15/2017 AT 1645 BY EVA          Radiology Studies: Ct Abdomen Pelvis W Contrast  Result Date: 12/15/2017 CLINICAL DATA:  Diarrhea.  Recent diagnosis of colitis. EXAM: CT ABDOMEN AND PELVIS WITH CONTRAST  TECHNIQUE: Multidetector CT imaging of the abdomen and pelvis was performed using the standard protocol following bolus administration of intravenous contrast. CONTRAST:  ISOVUE-300 IOPAMIDOL (ISOVUE-300) INJECTION 61% COMPARISON:  11/28/2017 FINDINGS: Lower chest: Normal Hepatobiliary: Normal Pancreas: Normal Spleen: Normal Adrenals/Urinary Tract: Adrenal glands are normal. Kidneys are normal. No cyst, mass, stone or hydronephrosis. Stomach/Bowel: Redemonstration diffuse colitis with wall thickening from the cecum to the rectum. No evidence of perforation. Small bowel appears normal. Vascular/Lymphatic: Aortic atherosclerosis. No aneurysm. IVC is normal. No retroperitoneal adenopathy. Reproductive: Normal Other: No free fluid or air. Musculoskeletal: Negative IMPRESSION: Redemonstration of diffuse wall thickening with mild surrounding inflammatory change throughout the colon consistent with diffuse colitis. No sign of perforation or  ascites. Electronically Signed   By: Paulina Fusi M.D.   On: 12/15/2017 16:53      Scheduled Meds: . dicyclomine  20 mg Oral TID AC & HS  . enoxaparin (LOVENOX) injection  40 mg Subcutaneous Q24H  . HYDROcodone-acetaminophen  1 tablet Oral QID  . lactobacillus acidophilus  2 tablet Oral TID  . nebivolol  5 mg Oral Daily  . ondansetron  4 mg Oral Q4H   Or  . ondansetron (ZOFRAN) IV  4 mg Intravenous Q4H  . pantoprazole  40 mg Oral Daily  . simvastatin  40 mg Oral q1800  . vancomycin  125 mg Oral Q6H   Continuous Infusions:   LOS: 1 day    Time spent in minutes: 35    Calvert Cantor, MD [facility] Pager: [email or web address] Password [email or web address] 12/17/2017, 11:29 AM

## 2017-12-17 NOTE — Progress Notes (Signed)
Gave work note to patient

## 2017-12-18 MED ORDER — BACID PO TABS: 2.0000 | ORAL_TABLET | Freq: Three times a day (TID) | ORAL | 0 refills | Status: DC

## 2017-12-18 MED ORDER — RISAQUAD PO CAPS
2.0000 | ORAL_CAPSULE | Freq: Three times a day (TID) | ORAL | Status: DC
  Administered 2017-12-18: 2 via ORAL
  Filled 2017-12-18: qty 2

## 2017-12-18 MED ORDER — DICYCLOMINE HCL 10 MG PO CAPS: 20.0000 mg | ORAL_CAPSULE | Freq: Three times a day (TID) | ORAL | 0 refills | Status: DC

## 2017-12-18 NOTE — Progress Notes (Signed)
Patient discharged in stable condition, IV removed and site intact. Patient discharged with all personal belongings, prescriptions, and work notes for him and his wife.

## 2017-12-18 NOTE — Progress Notes (Signed)
Patient wants a bentyl prescription when he is discharged.

## 2017-12-18 NOTE — Progress Notes (Signed)
[facility]  I have examined Mr Preston Davis today and note that he is stable for discharge today. He did not go yesterday as he had recurrent cramping when having BMs. He was started on Bentyl and this has resolved. Please see the discharge summary which I completed yesterday and have updated today.   Calvert CantorSaima , MD

## 2017-12-29 DIAGNOSIS — G894 Chronic pain syndrome: Secondary | ICD-10-CM | POA: Diagnosis not present

## 2017-12-29 DIAGNOSIS — I10 Essential (primary) hypertension: Secondary | ICD-10-CM | POA: Diagnosis not present

## 2017-12-29 DIAGNOSIS — Z6825 Body mass index (BMI) 25.0-25.9, adult: Secondary | ICD-10-CM | POA: Diagnosis not present

## 2017-12-29 DIAGNOSIS — M779 Enthesopathy, unspecified: Secondary | ICD-10-CM | POA: Diagnosis not present

## 2017-12-29 DIAGNOSIS — Z1389 Encounter for screening for other disorder: Secondary | ICD-10-CM | POA: Diagnosis not present

## 2017-12-29 DIAGNOSIS — J329 Chronic sinusitis, unspecified: Secondary | ICD-10-CM | POA: Diagnosis not present

## 2018-03-19 DIAGNOSIS — G4700 Insomnia, unspecified: Secondary | ICD-10-CM | POA: Diagnosis not present

## 2018-03-19 DIAGNOSIS — I700 Atherosclerosis of aorta: Secondary | ICD-10-CM | POA: Diagnosis not present

## 2018-03-19 DIAGNOSIS — E663 Overweight: Secondary | ICD-10-CM | POA: Diagnosis not present

## 2018-03-19 DIAGNOSIS — M5417 Radiculopathy, lumbosacral region: Secondary | ICD-10-CM | POA: Diagnosis not present

## 2018-03-19 DIAGNOSIS — Z1389 Encounter for screening for other disorder: Secondary | ICD-10-CM | POA: Diagnosis not present

## 2018-03-19 DIAGNOSIS — M62830 Muscle spasm of back: Secondary | ICD-10-CM | POA: Diagnosis not present

## 2018-03-19 DIAGNOSIS — D6851 Activated protein C resistance: Secondary | ICD-10-CM | POA: Diagnosis not present

## 2018-03-19 DIAGNOSIS — Z6826 Body mass index (BMI) 26.0-26.9, adult: Secondary | ICD-10-CM | POA: Diagnosis not present

## 2018-03-28 DIAGNOSIS — M47817 Spondylosis without myelopathy or radiculopathy, lumbosacral region: Secondary | ICD-10-CM | POA: Diagnosis not present

## 2018-03-28 DIAGNOSIS — M47816 Spondylosis without myelopathy or radiculopathy, lumbar region: Secondary | ICD-10-CM | POA: Diagnosis not present

## 2018-03-28 DIAGNOSIS — M545 Low back pain: Secondary | ICD-10-CM | POA: Diagnosis not present

## 2018-03-28 DIAGNOSIS — Z1389 Encounter for screening for other disorder: Secondary | ICD-10-CM | POA: Diagnosis not present

## 2018-06-21 DIAGNOSIS — M5126 Other intervertebral disc displacement, lumbar region: Secondary | ICD-10-CM | POA: Diagnosis not present

## 2018-06-21 DIAGNOSIS — M47816 Spondylosis without myelopathy or radiculopathy, lumbar region: Secondary | ICD-10-CM | POA: Diagnosis not present

## 2018-06-21 DIAGNOSIS — I10 Essential (primary) hypertension: Secondary | ICD-10-CM | POA: Diagnosis not present

## 2018-06-21 DIAGNOSIS — R200 Anesthesia of skin: Secondary | ICD-10-CM | POA: Diagnosis not present

## 2018-06-25 DIAGNOSIS — G894 Chronic pain syndrome: Secondary | ICD-10-CM | POA: Diagnosis not present

## 2018-06-25 DIAGNOSIS — Z0000 Encounter for general adult medical examination without abnormal findings: Secondary | ICD-10-CM | POA: Diagnosis not present

## 2018-06-25 DIAGNOSIS — Z125 Encounter for screening for malignant neoplasm of prostate: Secondary | ICD-10-CM | POA: Diagnosis not present

## 2018-06-25 DIAGNOSIS — M1991 Primary osteoarthritis, unspecified site: Secondary | ICD-10-CM | POA: Diagnosis not present

## 2018-06-25 DIAGNOSIS — I10 Essential (primary) hypertension: Secondary | ICD-10-CM | POA: Diagnosis not present

## 2018-06-25 DIAGNOSIS — Z13220 Encounter for screening for lipoid disorders: Secondary | ICD-10-CM | POA: Diagnosis not present

## 2018-06-25 DIAGNOSIS — Z6827 Body mass index (BMI) 27.0-27.9, adult: Secondary | ICD-10-CM | POA: Diagnosis not present

## 2018-06-25 DIAGNOSIS — E663 Overweight: Secondary | ICD-10-CM | POA: Diagnosis not present

## 2018-06-25 DIAGNOSIS — E782 Mixed hyperlipidemia: Secondary | ICD-10-CM | POA: Diagnosis not present

## 2018-07-05 DIAGNOSIS — D649 Anemia, unspecified: Secondary | ICD-10-CM | POA: Diagnosis not present

## 2018-07-13 DIAGNOSIS — M47816 Spondylosis without myelopathy or radiculopathy, lumbar region: Secondary | ICD-10-CM | POA: Diagnosis not present

## 2018-07-13 DIAGNOSIS — M5126 Other intervertebral disc displacement, lumbar region: Secondary | ICD-10-CM | POA: Diagnosis not present

## 2018-07-18 DIAGNOSIS — G5602 Carpal tunnel syndrome, left upper limb: Secondary | ICD-10-CM | POA: Diagnosis not present

## 2018-07-18 DIAGNOSIS — R200 Anesthesia of skin: Secondary | ICD-10-CM | POA: Diagnosis not present

## 2018-09-12 DIAGNOSIS — M5126 Other intervertebral disc displacement, lumbar region: Secondary | ICD-10-CM | POA: Diagnosis not present

## 2018-09-26 DIAGNOSIS — I10 Essential (primary) hypertension: Secondary | ICD-10-CM | POA: Diagnosis not present

## 2018-09-26 DIAGNOSIS — Z1389 Encounter for screening for other disorder: Secondary | ICD-10-CM | POA: Diagnosis not present

## 2018-09-26 DIAGNOSIS — Z6827 Body mass index (BMI) 27.0-27.9, adult: Secondary | ICD-10-CM | POA: Diagnosis not present

## 2018-09-26 DIAGNOSIS — G894 Chronic pain syndrome: Secondary | ICD-10-CM | POA: Diagnosis not present

## 2018-09-26 DIAGNOSIS — G4700 Insomnia, unspecified: Secondary | ICD-10-CM | POA: Diagnosis not present

## 2018-09-26 DIAGNOSIS — B029 Zoster without complications: Secondary | ICD-10-CM | POA: Diagnosis not present

## 2018-09-26 DIAGNOSIS — N529 Male erectile dysfunction, unspecified: Secondary | ICD-10-CM | POA: Diagnosis not present

## 2019-01-02 DIAGNOSIS — M5126 Other intervertebral disc displacement, lumbar region: Secondary | ICD-10-CM | POA: Diagnosis not present

## 2019-02-13 DIAGNOSIS — G894 Chronic pain syndrome: Secondary | ICD-10-CM | POA: Diagnosis not present

## 2019-02-13 DIAGNOSIS — Z1389 Encounter for screening for other disorder: Secondary | ICD-10-CM | POA: Diagnosis not present

## 2019-02-13 DIAGNOSIS — E663 Overweight: Secondary | ICD-10-CM | POA: Diagnosis not present

## 2019-02-13 DIAGNOSIS — Z6827 Body mass index (BMI) 27.0-27.9, adult: Secondary | ICD-10-CM | POA: Diagnosis not present

## 2019-02-13 DIAGNOSIS — M5126 Other intervertebral disc displacement, lumbar region: Secondary | ICD-10-CM | POA: Diagnosis not present

## 2019-03-14 DIAGNOSIS — Z1389 Encounter for screening for other disorder: Secondary | ICD-10-CM | POA: Diagnosis not present

## 2019-03-14 DIAGNOSIS — F419 Anxiety disorder, unspecified: Secondary | ICD-10-CM | POA: Diagnosis not present

## 2019-03-14 DIAGNOSIS — I700 Atherosclerosis of aorta: Secondary | ICD-10-CM | POA: Diagnosis not present

## 2019-03-14 DIAGNOSIS — Z6826 Body mass index (BMI) 26.0-26.9, adult: Secondary | ICD-10-CM | POA: Diagnosis not present

## 2019-03-14 DIAGNOSIS — G894 Chronic pain syndrome: Secondary | ICD-10-CM | POA: Diagnosis not present

## 2019-03-14 DIAGNOSIS — M1991 Primary osteoarthritis, unspecified site: Secondary | ICD-10-CM | POA: Diagnosis not present

## 2019-04-24 DIAGNOSIS — I10 Essential (primary) hypertension: Secondary | ICD-10-CM | POA: Diagnosis not present

## 2019-04-24 DIAGNOSIS — G894 Chronic pain syndrome: Secondary | ICD-10-CM | POA: Diagnosis not present

## 2019-04-24 DIAGNOSIS — M1991 Primary osteoarthritis, unspecified site: Secondary | ICD-10-CM | POA: Diagnosis not present

## 2019-04-24 DIAGNOSIS — E663 Overweight: Secondary | ICD-10-CM | POA: Diagnosis not present

## 2019-04-24 DIAGNOSIS — D6851 Activated protein C resistance: Secondary | ICD-10-CM | POA: Diagnosis not present

## 2019-04-24 DIAGNOSIS — Z1389 Encounter for screening for other disorder: Secondary | ICD-10-CM | POA: Diagnosis not present

## 2019-04-24 DIAGNOSIS — Z6828 Body mass index (BMI) 28.0-28.9, adult: Secondary | ICD-10-CM | POA: Diagnosis not present

## 2019-05-17 IMAGING — CT CT ABD-PELV W/ CM
2 of 5 series · 16 of 46 positions shown, 18 images · IV contrast (Isovue)
Comparison: Lumbar spine MRI dated 08/13/2014

CLINICAL DATA: 46-year-old male with abdominal infection.

EXAM:
CT ABDOMEN AND PELVIS WITH CONTRAST
TECHNIQUE: Multidetector CT imaging of the abdomen and pelvis was performed
using the standard protocol following bolus administration of
intravenous contrast.
CONTRAST:  100mL SZJE7E-XBB IOPAMIDOL ([id number]-XBB) INJECTION 61%

[Series 2: axial st · axial · 0.72mm/px · z∈[+894,+1344]mm · 13 of 101 slices shown, 15 images]
[im 6/101  soft-tissue]
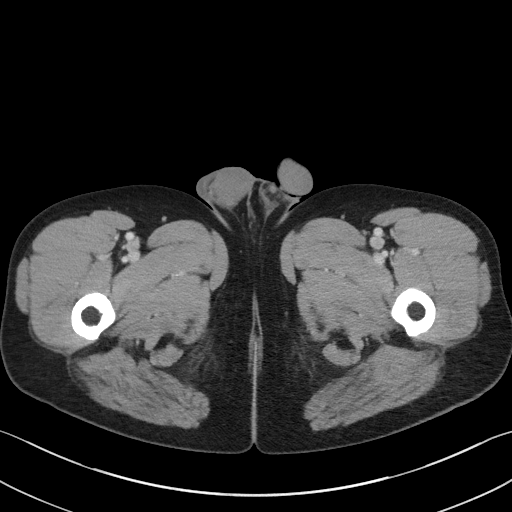
[im 6/101  bone]
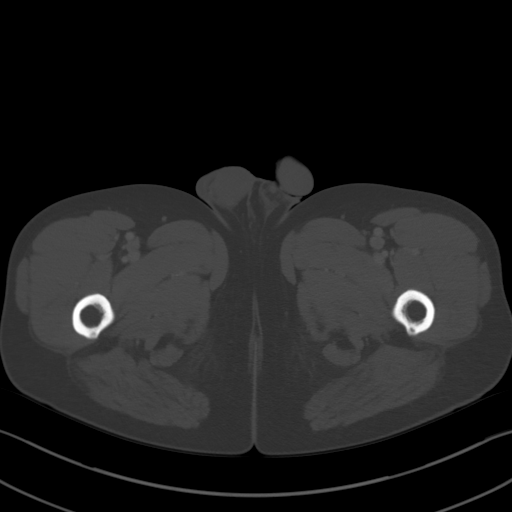
[im 16/101  soft-tissue]
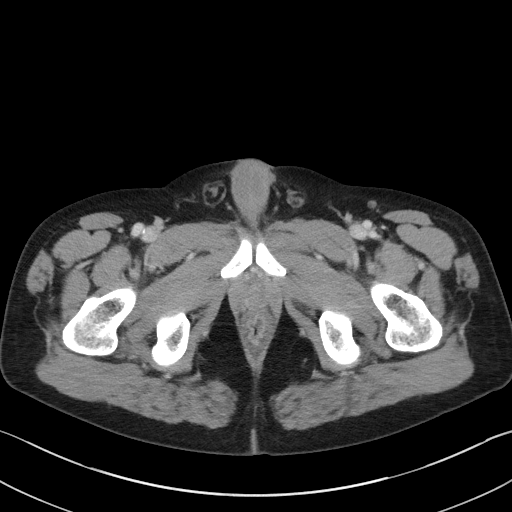
[im 21/101  soft-tissue]
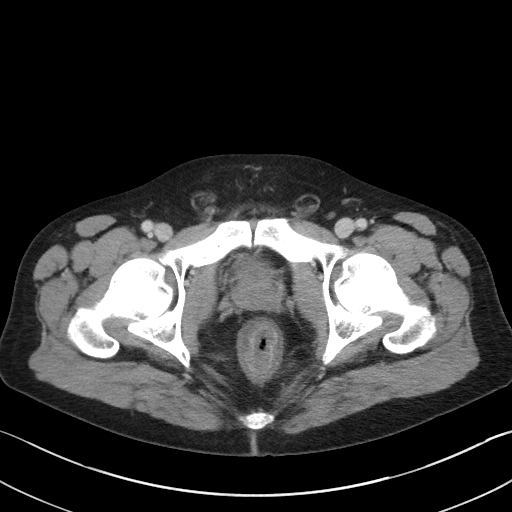
[im 31/101  soft-tissue]
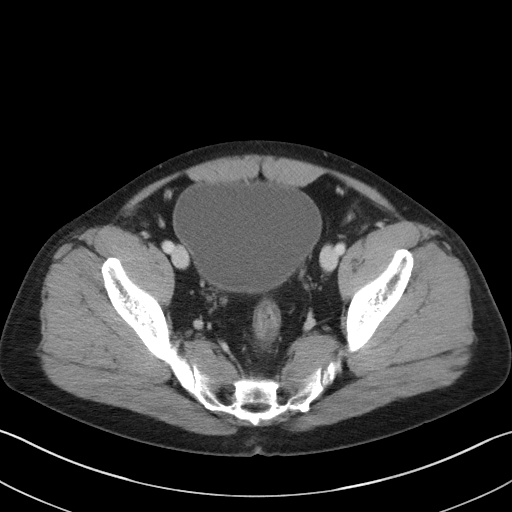
[im 36/101  soft-tissue]
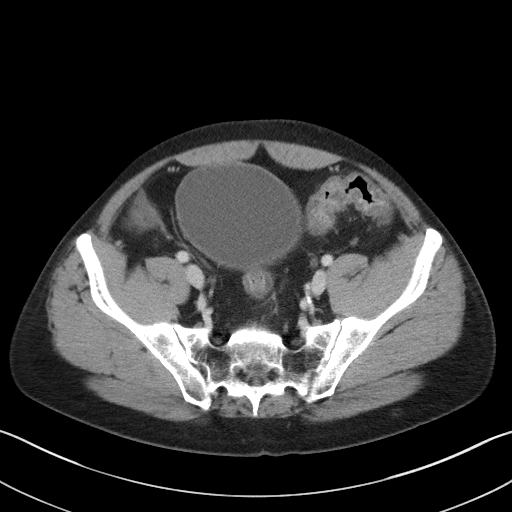
[im 46/101  soft-tissue]
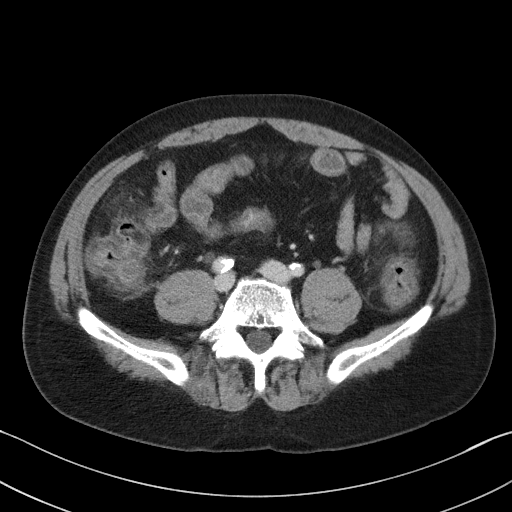
[im 51/101  soft-tissue]
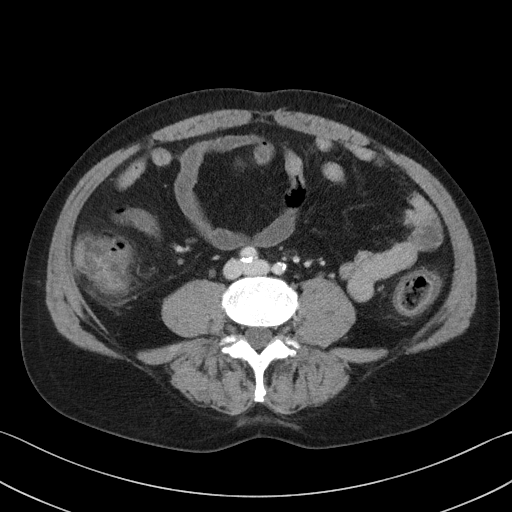
[im 56/101  soft-tissue]
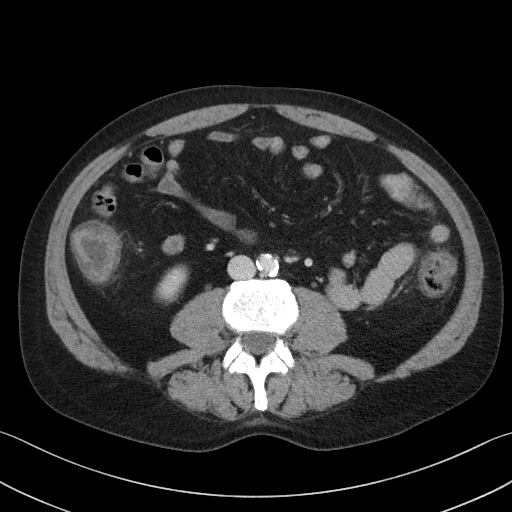
[im 66/101  soft-tissue]
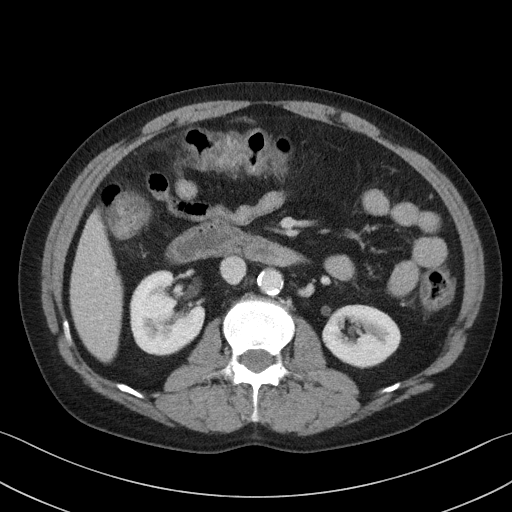
[im 66/101  bone]
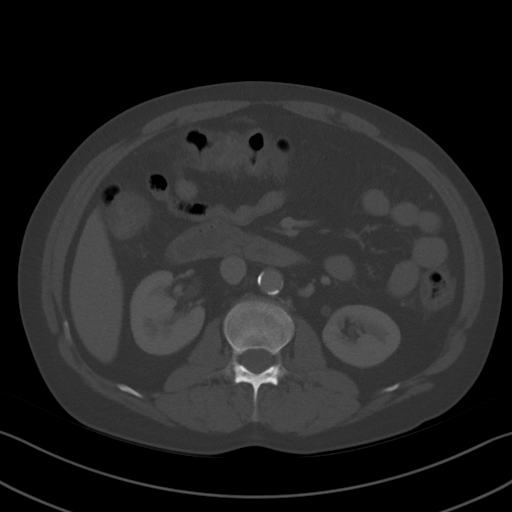
[im 71/101  soft-tissue]
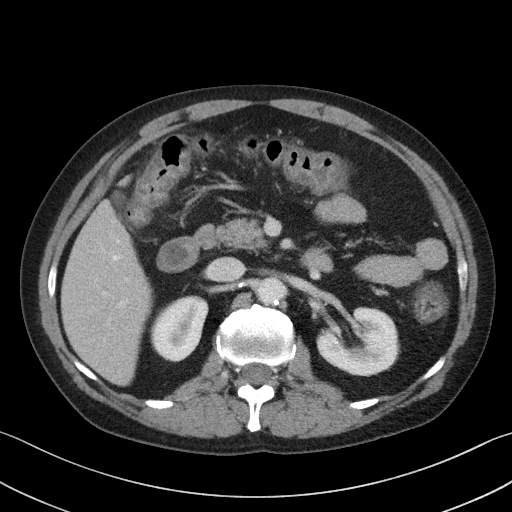
[im 81/101  soft-tissue]
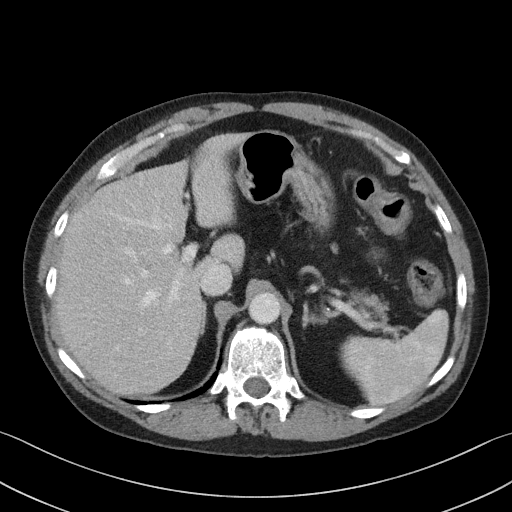
[im 86/101  soft-tissue]
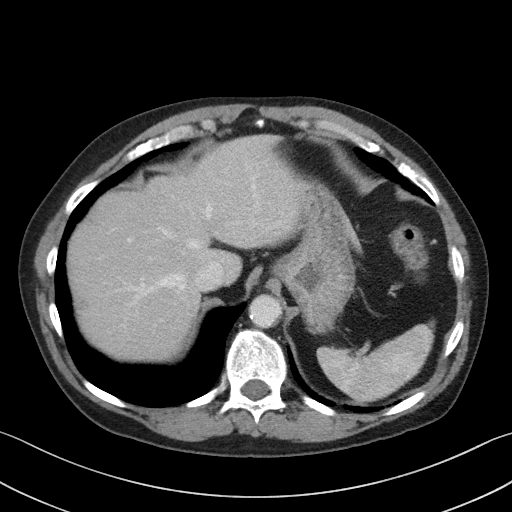
[im 96/101  soft-tissue]
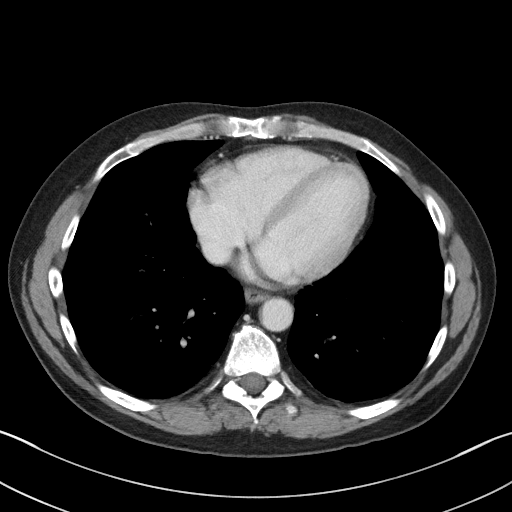

[Series 5: coronal st · coronal · 0.87mm/px · 3 of 87 slices shown]
[im 29/87  soft-tissue]
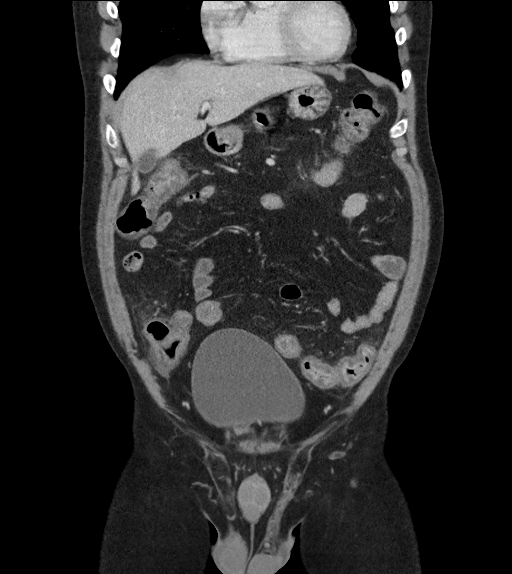
[im 39/87  soft-tissue]
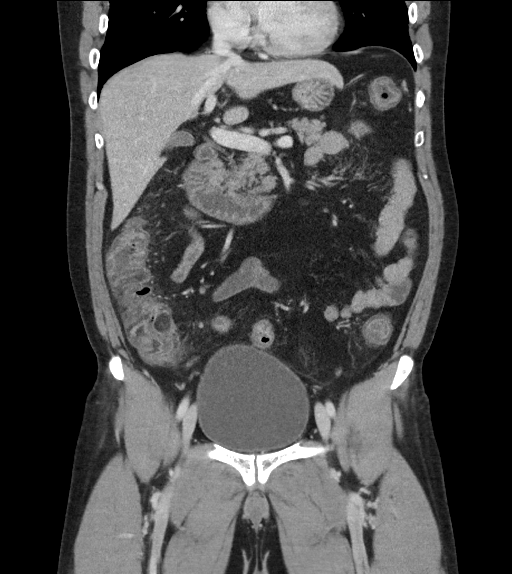
[im 48/87  soft-tissue]
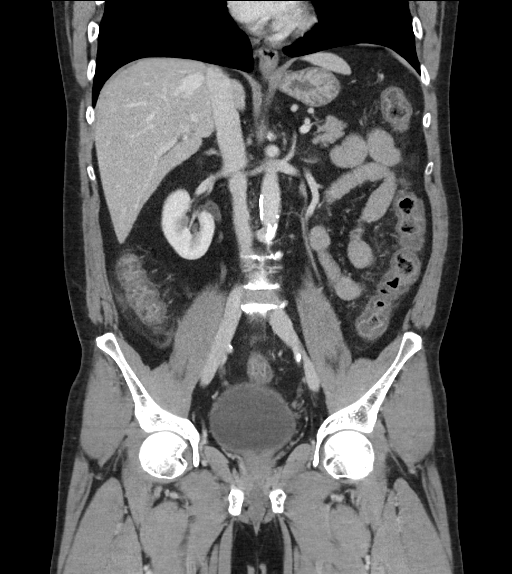

[16 of 46 positions shown; findings below may reference images not displayed]

FINDINGS: Lower chest: The visualized lung bases are clear.

No intra-abdominal free air or free fluid.

Hepatobiliary: No focal liver abnormality is seen. No gallstones,
gallbladder wall thickening, or biliary dilatation.

Pancreas: Unremarkable. No pancreatic ductal dilatation or
surrounding inflammatory changes.

Spleen: Normal in size without focal abnormality.

Adrenals/Urinary Tract: Adrenal glands are unremarkable. Kidneys are
normal, without renal calculi, focal lesion, or hydronephrosis.
Bladder is unremarkable.

Stomach/Bowel: There is diffuse thickened and inflamed appearance of
the colon consistent with pancolitis. Findings may be related to
Celsius diff colitis. Correlation with history of recent antibiotic
use as well as with stool culture recommended. There is no bowel
obstruction. Appendectomy.

Vascular/Lymphatic: Moderate aortoiliac atherosclerotic disease. The
SMV, splenic vein, and main portal vein are patent. No portal venous
gas. There is no adenopathy.

Reproductive: The prostate and seminal vesicles are grossly
unremarkable.

Other: Small fat containing umbilical hernia.

Musculoskeletal: No acute or significant osseous findings.
IMPRESSION: 1. Pancolitis suspicious for pseudomembranous colitis. Correlation
with stool cultures as well as with history of recent antibiotic use
recommended.
2.  Aortic Atherosclerosis ([id number]).

## 2019-05-27 DIAGNOSIS — E7849 Other hyperlipidemia: Secondary | ICD-10-CM | POA: Diagnosis not present

## 2019-05-27 DIAGNOSIS — G894 Chronic pain syndrome: Secondary | ICD-10-CM | POA: Diagnosis not present

## 2019-05-27 DIAGNOSIS — I10 Essential (primary) hypertension: Secondary | ICD-10-CM | POA: Diagnosis not present

## 2019-07-12 DIAGNOSIS — M5126 Other intervertebral disc displacement, lumbar region: Secondary | ICD-10-CM | POA: Diagnosis not present

## 2019-07-17 DIAGNOSIS — M2550 Pain in unspecified joint: Secondary | ICD-10-CM | POA: Diagnosis not present

## 2019-07-17 DIAGNOSIS — Z6827 Body mass index (BMI) 27.0-27.9, adult: Secondary | ICD-10-CM | POA: Diagnosis not present

## 2019-07-17 DIAGNOSIS — G894 Chronic pain syndrome: Secondary | ICD-10-CM | POA: Diagnosis not present

## 2019-07-17 DIAGNOSIS — M47816 Spondylosis without myelopathy or radiculopathy, lumbar region: Secondary | ICD-10-CM | POA: Diagnosis not present

## 2019-08-20 DIAGNOSIS — M1991 Primary osteoarthritis, unspecified site: Secondary | ICD-10-CM | POA: Diagnosis not present

## 2019-08-20 DIAGNOSIS — Z6826 Body mass index (BMI) 26.0-26.9, adult: Secondary | ICD-10-CM | POA: Diagnosis not present

## 2019-08-20 DIAGNOSIS — Z23 Encounter for immunization: Secondary | ICD-10-CM | POA: Diagnosis not present

## 2019-08-20 DIAGNOSIS — G894 Chronic pain syndrome: Secondary | ICD-10-CM | POA: Diagnosis not present

## 2019-08-20 DIAGNOSIS — I10 Essential (primary) hypertension: Secondary | ICD-10-CM | POA: Diagnosis not present

## 2019-08-20 DIAGNOSIS — Z0001 Encounter for general adult medical examination with abnormal findings: Secondary | ICD-10-CM | POA: Diagnosis not present

## 2019-08-20 DIAGNOSIS — E663 Overweight: Secondary | ICD-10-CM | POA: Diagnosis not present

## 2019-09-05 ENCOUNTER — Encounter: Payer: Self-pay | Admitting: Internal Medicine

## 2019-09-19 DIAGNOSIS — Z6827 Body mass index (BMI) 27.0-27.9, adult: Secondary | ICD-10-CM | POA: Diagnosis not present

## 2019-09-19 DIAGNOSIS — E663 Overweight: Secondary | ICD-10-CM | POA: Diagnosis not present

## 2019-09-19 DIAGNOSIS — G894 Chronic pain syndrome: Secondary | ICD-10-CM | POA: Diagnosis not present

## 2019-09-19 DIAGNOSIS — R454 Irritability and anger: Secondary | ICD-10-CM | POA: Diagnosis not present

## 2019-10-01 ENCOUNTER — Encounter: Payer: Self-pay | Admitting: Nurse Practitioner

## 2019-10-01 ENCOUNTER — Telehealth: Payer: Self-pay | Admitting: Nurse Practitioner

## 2019-10-01 ENCOUNTER — Ambulatory Visit: Payer: BLUE CROSS/BLUE SHIELD | Admitting: Nurse Practitioner

## 2019-10-01 NOTE — Telephone Encounter (Signed)
PATIENT WAS A NO SHOW AND LETTER SENT  °

## 2019-12-03 DIAGNOSIS — E663 Overweight: Secondary | ICD-10-CM | POA: Diagnosis not present

## 2019-12-03 DIAGNOSIS — Z23 Encounter for immunization: Secondary | ICD-10-CM | POA: Diagnosis not present

## 2019-12-03 DIAGNOSIS — G894 Chronic pain syndrome: Secondary | ICD-10-CM | POA: Diagnosis not present

## 2019-12-03 DIAGNOSIS — Z6827 Body mass index (BMI) 27.0-27.9, adult: Secondary | ICD-10-CM | POA: Diagnosis not present

## 2020-01-03 DIAGNOSIS — Z6827 Body mass index (BMI) 27.0-27.9, adult: Secondary | ICD-10-CM | POA: Diagnosis not present

## 2020-01-03 DIAGNOSIS — M1991 Primary osteoarthritis, unspecified site: Secondary | ICD-10-CM | POA: Diagnosis not present

## 2020-01-03 DIAGNOSIS — G894 Chronic pain syndrome: Secondary | ICD-10-CM | POA: Diagnosis not present

## 2020-01-03 DIAGNOSIS — I700 Atherosclerosis of aorta: Secondary | ICD-10-CM | POA: Diagnosis not present

## 2020-01-03 DIAGNOSIS — I10 Essential (primary) hypertension: Secondary | ICD-10-CM | POA: Diagnosis not present

## 2020-02-13 DIAGNOSIS — M75102 Unspecified rotator cuff tear or rupture of left shoulder, not specified as traumatic: Secondary | ICD-10-CM | POA: Diagnosis not present

## 2020-02-13 DIAGNOSIS — Z6827 Body mass index (BMI) 27.0-27.9, adult: Secondary | ICD-10-CM | POA: Diagnosis not present

## 2020-02-13 DIAGNOSIS — G894 Chronic pain syndrome: Secondary | ICD-10-CM | POA: Diagnosis not present

## 2020-02-13 DIAGNOSIS — M1991 Primary osteoarthritis, unspecified site: Secondary | ICD-10-CM | POA: Diagnosis not present

## 2020-02-13 DIAGNOSIS — I10 Essential (primary) hypertension: Secondary | ICD-10-CM | POA: Diagnosis not present

## 2020-02-20 ENCOUNTER — Encounter: Payer: Self-pay | Admitting: Gastroenterology

## 2020-03-02 ENCOUNTER — Ambulatory Visit: Payer: BC Managed Care – PPO | Admitting: Gastroenterology

## 2020-04-01 ENCOUNTER — Ambulatory Visit: Payer: Self-pay | Admitting: Gastroenterology

## 2020-04-22 DIAGNOSIS — M5126 Other intervertebral disc displacement, lumbar region: Secondary | ICD-10-CM | POA: Diagnosis not present

## 2020-05-19 DIAGNOSIS — Z6826 Body mass index (BMI) 26.0-26.9, adult: Secondary | ICD-10-CM | POA: Diagnosis not present

## 2020-05-19 DIAGNOSIS — Z1211 Encounter for screening for malignant neoplasm of colon: Secondary | ICD-10-CM | POA: Diagnosis not present

## 2020-05-19 DIAGNOSIS — I10 Essential (primary) hypertension: Secondary | ICD-10-CM | POA: Diagnosis not present

## 2020-05-19 DIAGNOSIS — M1991 Primary osteoarthritis, unspecified site: Secondary | ICD-10-CM | POA: Diagnosis not present

## 2020-05-19 DIAGNOSIS — F419 Anxiety disorder, unspecified: Secondary | ICD-10-CM | POA: Diagnosis not present

## 2020-05-19 DIAGNOSIS — G894 Chronic pain syndrome: Secondary | ICD-10-CM | POA: Diagnosis not present

## 2020-05-19 DIAGNOSIS — E663 Overweight: Secondary | ICD-10-CM | POA: Diagnosis not present

## 2020-05-19 DIAGNOSIS — Z1389 Encounter for screening for other disorder: Secondary | ICD-10-CM | POA: Diagnosis not present

## 2020-05-19 DIAGNOSIS — Z23 Encounter for immunization: Secondary | ICD-10-CM | POA: Diagnosis not present

## 2020-07-10 DIAGNOSIS — M5126 Other intervertebral disc displacement, lumbar region: Secondary | ICD-10-CM | POA: Diagnosis not present

## 2020-07-10 DIAGNOSIS — I10 Essential (primary) hypertension: Secondary | ICD-10-CM | POA: Diagnosis not present

## 2020-07-10 DIAGNOSIS — Z6826 Body mass index (BMI) 26.0-26.9, adult: Secondary | ICD-10-CM | POA: Diagnosis not present

## 2020-07-27 DIAGNOSIS — Z6827 Body mass index (BMI) 27.0-27.9, adult: Secondary | ICD-10-CM | POA: Diagnosis not present

## 2020-07-27 DIAGNOSIS — G4700 Insomnia, unspecified: Secondary | ICD-10-CM | POA: Diagnosis not present

## 2020-07-27 DIAGNOSIS — E663 Overweight: Secondary | ICD-10-CM | POA: Diagnosis not present

## 2020-07-27 DIAGNOSIS — G894 Chronic pain syndrome: Secondary | ICD-10-CM | POA: Diagnosis not present

## 2020-07-27 DIAGNOSIS — I10 Essential (primary) hypertension: Secondary | ICD-10-CM | POA: Diagnosis not present

## 2020-07-27 DIAGNOSIS — M1991 Primary osteoarthritis, unspecified site: Secondary | ICD-10-CM | POA: Diagnosis not present

## 2020-09-29 DIAGNOSIS — M1991 Primary osteoarthritis, unspecified site: Secondary | ICD-10-CM | POA: Diagnosis not present

## 2020-09-29 DIAGNOSIS — I10 Essential (primary) hypertension: Secondary | ICD-10-CM | POA: Diagnosis not present

## 2020-09-29 DIAGNOSIS — Z23 Encounter for immunization: Secondary | ICD-10-CM | POA: Diagnosis not present

## 2020-09-29 DIAGNOSIS — Z6827 Body mass index (BMI) 27.0-27.9, adult: Secondary | ICD-10-CM | POA: Diagnosis not present

## 2020-09-29 DIAGNOSIS — G894 Chronic pain syndrome: Secondary | ICD-10-CM | POA: Diagnosis not present

## 2020-09-29 DIAGNOSIS — E663 Overweight: Secondary | ICD-10-CM | POA: Diagnosis not present

## 2020-10-20 DIAGNOSIS — Z1211 Encounter for screening for malignant neoplasm of colon: Secondary | ICD-10-CM | POA: Diagnosis not present

## 2020-10-20 DIAGNOSIS — Z1212 Encounter for screening for malignant neoplasm of rectum: Secondary | ICD-10-CM | POA: Diagnosis not present

## 2020-11-30 DIAGNOSIS — I10 Essential (primary) hypertension: Secondary | ICD-10-CM | POA: Diagnosis not present

## 2020-11-30 DIAGNOSIS — Z6828 Body mass index (BMI) 28.0-28.9, adult: Secondary | ICD-10-CM | POA: Diagnosis not present

## 2020-11-30 DIAGNOSIS — G894 Chronic pain syndrome: Secondary | ICD-10-CM | POA: Diagnosis not present

## 2020-11-30 DIAGNOSIS — G4700 Insomnia, unspecified: Secondary | ICD-10-CM | POA: Diagnosis not present

## 2021-02-01 DIAGNOSIS — E663 Overweight: Secondary | ICD-10-CM | POA: Diagnosis not present

## 2021-02-01 DIAGNOSIS — E7849 Other hyperlipidemia: Secondary | ICD-10-CM | POA: Diagnosis not present

## 2021-02-01 DIAGNOSIS — M1991 Primary osteoarthritis, unspecified site: Secondary | ICD-10-CM | POA: Diagnosis not present

## 2021-02-01 DIAGNOSIS — Z125 Encounter for screening for malignant neoplasm of prostate: Secondary | ICD-10-CM | POA: Diagnosis not present

## 2021-02-01 DIAGNOSIS — Z6828 Body mass index (BMI) 28.0-28.9, adult: Secondary | ICD-10-CM | POA: Diagnosis not present

## 2021-02-01 DIAGNOSIS — I700 Atherosclerosis of aorta: Secondary | ICD-10-CM | POA: Diagnosis not present

## 2021-02-01 DIAGNOSIS — Z0000 Encounter for general adult medical examination without abnormal findings: Secondary | ICD-10-CM | POA: Diagnosis not present

## 2021-02-01 DIAGNOSIS — N529 Male erectile dysfunction, unspecified: Secondary | ICD-10-CM | POA: Diagnosis not present

## 2021-02-01 DIAGNOSIS — I10 Essential (primary) hypertension: Secondary | ICD-10-CM | POA: Diagnosis not present

## 2021-02-01 DIAGNOSIS — Z1331 Encounter for screening for depression: Secondary | ICD-10-CM | POA: Diagnosis not present

## 2021-02-01 DIAGNOSIS — G4700 Insomnia, unspecified: Secondary | ICD-10-CM | POA: Diagnosis not present

## 2021-02-01 DIAGNOSIS — G894 Chronic pain syndrome: Secondary | ICD-10-CM | POA: Diagnosis not present

## 2021-02-01 DIAGNOSIS — Z1389 Encounter for screening for other disorder: Secondary | ICD-10-CM | POA: Diagnosis not present

## 2021-04-07 ENCOUNTER — Other Ambulatory Visit (HOSPITAL_COMMUNITY): Payer: Self-pay | Admitting: Internal Medicine

## 2021-04-07 DIAGNOSIS — M25542 Pain in joints of left hand: Secondary | ICD-10-CM

## 2021-04-14 ENCOUNTER — Ambulatory Visit (HOSPITAL_COMMUNITY)
Admission: RE | Admit: 2021-04-14 | Discharge: 2021-04-14 | Disposition: A | Discharge status: Home / Self Care | Payer: BLUE CROSS/BLUE SHIELD | Source: Ambulatory Visit | Attending: Internal Medicine | Admitting: Internal Medicine

## 2021-04-14 DIAGNOSIS — M25542 Pain in joints of left hand: Secondary | ICD-10-CM | POA: Diagnosis present

## 2022-07-05 ENCOUNTER — Other Ambulatory Visit: Payer: Self-pay | Admitting: Internal Medicine

## 2022-07-05 ENCOUNTER — Other Ambulatory Visit (HOSPITAL_COMMUNITY): Payer: Self-pay | Admitting: Internal Medicine

## 2022-07-05 DIAGNOSIS — M79602 Pain in left arm: Secondary | ICD-10-CM

## 2022-07-07 ENCOUNTER — Encounter (HOSPITAL_COMMUNITY): Payer: Self-pay

## 2022-07-07 ENCOUNTER — Ambulatory Visit (HOSPITAL_COMMUNITY)
Admission: RE | Admit: 2022-07-07 | Discharge: 2022-07-07 | Disposition: A | Discharge status: Home / Self Care | Payer: BLUE CROSS/BLUE SHIELD | Source: Ambulatory Visit | Attending: Internal Medicine | Admitting: Internal Medicine

## 2022-07-07 ENCOUNTER — Ambulatory Visit (HOSPITAL_COMMUNITY): Admission: RE | Admit: 2022-07-07 | Payer: BLUE CROSS/BLUE SHIELD | Source: Ambulatory Visit

## 2022-07-07 DIAGNOSIS — M79602 Pain in left arm: Secondary | ICD-10-CM | POA: Diagnosis present

## 2022-07-08 ENCOUNTER — Other Ambulatory Visit (HOSPITAL_COMMUNITY): Payer: Self-pay | Admitting: Internal Medicine

## 2022-07-08 ENCOUNTER — Other Ambulatory Visit: Payer: Self-pay | Admitting: Internal Medicine

## 2022-07-08 DIAGNOSIS — M79602 Pain in left arm: Secondary | ICD-10-CM

## 2022-07-11 ENCOUNTER — Ambulatory Visit (HOSPITAL_COMMUNITY)
Admission: RE | Admit: 2022-07-11 | Discharge: 2022-07-11 | Disposition: A | Discharge status: Home / Self Care | Payer: BLUE CROSS/BLUE SHIELD | Source: Ambulatory Visit | Attending: Internal Medicine | Admitting: Internal Medicine

## 2022-07-11 DIAGNOSIS — M79602 Pain in left arm: Secondary | ICD-10-CM

## 2022-07-12 ENCOUNTER — Ambulatory Visit (HOSPITAL_COMMUNITY)
Admission: RE | Admit: 2022-07-12 | Discharge: 2022-07-12 | Disposition: A | Discharge status: Home / Self Care | Payer: BLUE CROSS/BLUE SHIELD | Source: Ambulatory Visit | Attending: Internal Medicine | Admitting: Internal Medicine

## 2022-07-12 ENCOUNTER — Encounter (HOSPITAL_COMMUNITY): Payer: Self-pay | Admitting: Radiology

## 2022-07-12 DIAGNOSIS — M79602 Pain in left arm: Secondary | ICD-10-CM | POA: Diagnosis not present

## 2022-07-12 MED ORDER — IOHEXOL 350 MG/ML SOLN
75.0000 mL | Freq: Once | INTRAVENOUS | Status: AC | PRN
  Administered 2022-07-12: 75 mL via INTRAVENOUS

## 2022-07-18 NOTE — Progress Notes (Unsigned)
[facility] [place]  ASSESSMENT / PLAN: [name] is a 51 y.o. male with atherosclerosis of left proximal subclavian artery causing {Chronic PAD levels:25303}.  Patient counseled {pad risk2:26283}  WIfI score calculated based on clinical exam and non-invasive measurements. {WIFIvascular:26096}  Recommend the following which can slow the progression of atherosclerosis and reduce the risk of major adverse cardiac / limb events:  Complete cessation from all tobacco products. Blood glucose control with goal A1c < 7%. Blood pressure control with goal blood pressure < 140/90 mmHg. Lipid reduction therapy with goal LDL-C <100 mg/dL (<61 if symptomatic from PAD).  Aspirin 81mg  PO QD.  *** Clopidogrel 75mg  PO QD. *** Rivaroxaban 2.5mg  PO BID. *** Cilostozal 100mg  PO BID for intermittent claudication without evidence of heart failure. Atorvastatin 40-80mg  PO QD (or other "high intensity" statin therapy). *** Daily walking to and past the point of discomfort. Patient counseled to keep a log of exercise distance. *** Adequate hydration (at least 2 liters / day) if patient's heart and kidney function is adequate.  Plan *** lower extremity angiogram with possible intervention via *** approach in cath lab ***.    CHIEF COMPLAINT: ***  HISTORY OF PRESENT ILLNESS: Preston Davis is a 51 y.o. male ***  VASCULAR SURGICAL HISTORY: ***  VASCULAR RISK FACTORS: {FINDINGS; POSITIVE NEGATIVE:708-039-1237} history of stroke / transient ischemic attack. {FINDINGS; POSITIVE NEGATIVE:708-039-1237} history of coronary artery disease. *** history of PCI. *** history of CABG.  {FINDINGS; POSITIVE NEGATIVE:708-039-1237} history of diabetes mellitus. Last A1c ***. {FINDINGS; POSITIVE NEGATIVE:708-039-1237} history of smoking. *** actively smoking. {FINDINGS; POSITIVE NEGATIVE:708-039-1237} history of hypertension. *** drug regimen with *** control. {FINDINGS; POSITIVE NEGATIVE:708-039-1237}  history of chronic kidney disease.  Last GFR ***. CKD {stage:30421363}. {FINDINGS; POSITIVE NEGATIVE:708-039-1237} history of chronic obstructive pulmonary disease, treated with ***.  FUNCTIONAL STATUS: ECOG performance status: {findings; ecog performance status:31780} Ambulatory status: {TNHAmbulation:25868}  Past Medical History:  Diagnosis Date   Acid reflux    C. difficile colitis    DVT (deep venous thrombosis) (HCC) 2014   left leg   Factor 5 Leiden mutation, heterozygous (HCC)    Heart murmur    High blood cholesterol level    Hypertension    Right shoulder pain     Past Surgical History:  Procedure Laterality Date   APPENDECTOMY     bone graft  right great toe     for bunion   SHOULDER OPEN ROTATOR CUFF REPAIR Right 05/14/2013   Procedure: ROTATOR CUFF REPAIR SHOULDER OPEN;  Surgeon: Leavy Cella, MD;  Location: [facility];  Service: Orthopedics;  Laterality: Right;   VASECTOMY      Family History  Problem Relation Age of Onset   Clotting disorder Mother    Diabetes Paternal Grandmother     Social History   Socioeconomic History   Marital status: Married    Spouse name: Not on file   Number of children: Not on file   Years of education: Not on file   Highest education level: Not on file  Occupational History   Not on file  Tobacco Use   Smoking status: Former    Packs/day: 1.00    Types: Cigarettes    Quit date: 06/03/2013    Years since quitting: 9.1   Smokeless tobacco: Never  Vaping Use   Vaping Use: Never used  Substance and Sexual Activity   Alcohol use: No   Drug use: No   Sexual activity: Yes    Birth control/protection: Other-see comments  Comment: had vasectomy  Other Topics Concern   Not on file  Social History Narrative   Not on file   Social Determinants of Health   Financial Resource Strain: Not on file  Food Insecurity: Not on file  Transportation Needs: Not on file  Physical Activity: Not on file  Stress: Not on file  Social  Connections: Not on file  Intimate Partner Violence: Not on file    Allergies  Allergen Reactions   Benicar [Olmesartan] Other (See Comments)    REACTION: Unable to regulate blood pressure with this medication--caused BP levels to drop too low.    Current Outpatient Medications  Medication Sig Dispense Refill   dicyclomine (BENTYL) 10 MG capsule Take 2 capsules (20 mg total) by mouth 4 (four) times daily -  before meals and at bedtime. 40 capsule 0   HYDROcodone-acetaminophen (NORCO) 10-325 MG tablet Take 1 tablet by mouth 4 (four) times daily.     HYDROcodone-acetaminophen (NORCO/VICODIN) 5-325 MG tablet Take 1-2 tablets by mouth every 6 (six) hours as needed. (Patient not taking: Reported on 12/15/2017) 10 tablet 0   lactobacillus acidophilus (BACID) TABS tablet Take 2 tablets by mouth 3 (three) times daily. 60 each 0   nebivolol (BYSTOLIC) 5 MG tablet Take 5 mg by mouth daily.     sildenafil (VIAGRA) 100 MG tablet Take 50-100 mg by mouth daily as needed for erectile dysfunction. Takes 1/2 tablet as needed      simvastatin (ZOCOR) 40 MG tablet Take 40 mg by mouth daily.  3   vancomycin (VANCOCIN) 50 mg/mL oral solution Take 2.5 mLs (125 mg total) by mouth every 6 (six) hours. 160 mL 0   zolpidem (AMBIEN) 10 MG tablet Take 5-10 mg by mouth at bedtime as needed for sleep.      No current facility-administered medications for this visit.    PHYSICAL EXAM There were no vitals filed for this visit.  Constitutional: *** appearing. *** distress. Appears *** nourished.  Neurologic: CN ***. *** focal findings. *** sensory loss. Psychiatric: *** Mood and affect symmetric and appropriate. Eyes: *** No icterus. No conjunctival pallor. Ears, nose, throat: *** mucous membranes moist. Midline trachea.  Cardiac: *** rate and rhythm.  Respiratory: *** unlabored. Abdominal: *** soft, non-tender, non-distended.  Peripheral vascular: *** Extremity: *** edema. *** cyanosis. *** pallor.  Skin: ***  gangrene. *** ulceration.  Lymphatic: *** Stemmer's sign. *** palpable lymphadenopathy.    PERTINENT LABORATORY AND RADIOLOGIC DATA  Most recent CBC    Latest Ref Rng & Units 12/17/2017    6:12 AM 12/15/2017    1:12 PM 11/28/2017    7:43 PM  CBC  WBC 4.0 - 10.5 K/uL 6.8  9.9  12.3   Hemoglobin 13.0 - 17.0 g/dL 41.9  37.9  02.4   Hematocrit 39.0 - 52.0 % 39.0  41.7  45.7   Platelets 150 - 400 K/uL 207  218  252      Most recent CMP    Latest Ref Rng & Units 12/17/2017    6:12 AM 12/16/2017    6:19 AM 12/15/2017    1:12 PM  CMP  Glucose 65 - 99 mg/dL 097  353  299   BUN 6 - 20 mg/dL 10  9  15    Creatinine 0.61 - 1.24 mg/dL  2.42  6.83   Sodium 135 - 145 mmol/L 139  138  137   Potassium 3.5 - 5.1 mmol/L 3.7  3.7  4.1   Chloride 101 -  111 mmol/L 105  107  103   CO2 22 - 32 mmol/L 25  21  22    Calcium 8.9 - 10.3 mg/dL 8.4  8.5  8.7   Total Protein 6.5 - 8.1 g/dL   6.4   Total Bilirubin 0.3 - 1.2 mg/dL   0.9   Alkaline Phos 38 - 126 U/L   71   AST 15 - 41 U/L   28   ALT 17 - 63 U/L   35     Renal function CrCl cannot be calculated (Patient's most recent lab result is older than the maximum 21 days allowed.).  No results found for: "HGBA1C"  No results found for: "LDLCALC", "LDLC", "HIRISKLDL", "POCLDL", "LDLDIRECT", "REALLDLC", "TOTLDLC"   Vascular Imaging: ***   N. , MD [facility] Phone Number: [phone] 07/18/2022 2:44 PM  Total time spent on preparing this encounter including chart review, data review, collecting history, examining the patient, coordinating care for this {tnhtimebilling:26202}  Portions of this report may have been transcribed using voice recognition software.  Every effort has been made to ensure accuracy; however, inadvertent computerized transcription errors may still be present.

## 2022-07-19 ENCOUNTER — Ambulatory Visit: Payer: BLUE CROSS/BLUE SHIELD | Admitting: Vascular Surgery

## 2022-07-19 ENCOUNTER — Encounter: Payer: Self-pay | Admitting: Vascular Surgery

## 2022-07-19 VITALS — BP 121/78 | HR 51 | Temp 97.8°F | Resp 20 | Ht 68.0 in | Wt 174.0 lb

## 2022-07-19 DIAGNOSIS — I739 Peripheral vascular disease, unspecified: Secondary | ICD-10-CM | POA: Diagnosis not present

## 2022-07-19 MED ORDER — CILOSTAZOL 50 MG PO TABS: 100.0000 mg | ORAL_TABLET | Freq: Two times a day (BID) | ORAL | Status: DC

## 2022-07-22 ENCOUNTER — Telehealth: Payer: Self-pay

## 2022-07-22 ENCOUNTER — Other Ambulatory Visit: Payer: Self-pay

## 2022-07-22 DIAGNOSIS — I739 Peripheral vascular disease, unspecified: Secondary | ICD-10-CM

## 2022-07-22 NOTE — Telephone Encounter (Signed)
Patient's wife called stating the pharmacy didn't receive the pt's medication from his previous visit with Dr. Lenell Antu.  I called the medication in verbally to [phone] per wife's request.   Pletal 100mg   Sig take 1 tab BID QNTY: 90 Refills: 3

## 2022-07-25 ENCOUNTER — Telehealth: Payer: Self-pay

## 2022-07-25 NOTE — Telephone Encounter (Signed)
Per MD, pt has been advised to stop Pletal and to let us know if his symptoms worsen, as we will move up his appt to a sooner date. Pt is aware and will call back if he wants to proceed with sooner appt.

## 2022-07-25 NOTE — Telephone Encounter (Signed)
Pt's wife called to let us know pt started Cilostazol a few days ago and has been getting headaches. MD has been made aware. Will advise once we hear back from MD on next steps.

## 2022-08-11 ENCOUNTER — Telehealth: Payer: Self-pay

## 2022-08-11 NOTE — Telephone Encounter (Signed)
-----   Message from Leonie Douglas, MD sent at 08/11/2022 11:25 AM EDT ----- Regarding: RE: Surgery No need for follow up. We can do it any Friday 9/15 onward. Thank you!  ----- Message ----- From: Hurshel Keys, RN Sent: 08/11/2022  10:42 AM EDT To: Leonie Douglas, MD Subject: Surgery                                        This pt called saying that he wanted to proceed with surgery. He was scheduled to f/u in 3 mos after starting Pletal, which he had to stop d/t headaches. Does he need to come back in to discuss or can he go ahead and start the surgery scheduling? Thanks, Psychologist, prison and probation services - Triage

## 2022-08-11 NOTE — Telephone Encounter (Addendum)
Reply from Dr Lenell Antu printed and given to [facility] in surgery scheduling.  Called pt to inform him of f/u call for surgery scheduling. Pt wanted to speak with Dr Lenell Antu again before proceeding and admitted that he was very scared. Pt reassured and appts moved up for [place] and office visit. Confirmed understanding.

## 2022-09-05 NOTE — Progress Notes (Signed)
[facility] OF [place]  ASSESSMENT / PLAN: Preston Davis is a 51 y.o. 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   Patient counseled patients with asymptomatic peripheral arterial disease or claudication have a 1-2% risk of developing chronic limb threatening ischemia, but a 15-30% risk of mortality in the next 5 years. Intervention should only be considered for medically optimized patients with disabling symptoms.   Recommend the following which can slow the progression of atherosclerosis and reduce the risk of major adverse cardiac / limb events:  Complete cessation from all tobacco products. Blood glucose control with goal A1c < 7%. Blood pressure control with goal blood pressure < 140/90 mmHg. Lipid reduction therapy with goal LDL-C <100 mg/dL (<06 if symptomatic from PAD).  Aspirin 81mg  PO QD.  Cilostozal 100mg  PO BID for intermittent claudication without evidence of heart failure. Atorvastatin 40-80mg  PO QD (or other "high intensity" statin therapy).  Patient now interested in undergoing stenting.  I reviewed the common risks, benefits, and alternatives to stenting.  He is willing to proceed.  We will plan to do this in the near future.  CHIEF COMPLAINT: Left arm pain  HISTORY OF PRESENT ILLNESS: Preston Davis is a 51 y.o. male who presents to clinic for evaluation of left arm pain with overhead activity.  The patient has had an extensive work-up by his primary care physician, which has identified severe left subclavian artery stenosis.  The patient reports a fairly classic history of upper extremity claudication type symptoms.  He has easy fatigability, and pain with overhead and strenuous activity left upper extremity.  [name], but does write with his right hand.  We had a long discussion about the risk, benefits, and alternatives of an intervention.   09/06/22: Patient returns to  clinic to clinic.  He would like to proceed with stenting as he does not feel his symptoms are getting any better.  Past Medical History:  Diagnosis Date   Acid reflux    C. difficile colitis    DVT (deep venous thrombosis) (HCC) 2014   left leg   Factor 5 Leiden mutation, heterozygous (HCC)    Heart murmur    High blood cholesterol level    Hypertension    Right shoulder pain     Past Surgical History:  Procedure Laterality Date   APPENDECTOMY     bone graft  right great toe     for bunion   SHOULDER OPEN ROTATOR CUFF REPAIR Right 05/14/2013   Procedure: ROTATOR CUFF REPAIR SHOULDER OPEN;  Surgeon: 11/06/22, MD;  Location: [facility];  Service: Orthopedics;  Laterality: Right;   VASECTOMY      Family History  Problem Relation Age of Onset   Clotting disorder Mother    Diabetes Paternal Grandmother     Social History   Socioeconomic History   Marital status: Married    Spouse name: Not on file   Number of children: Not on file   Years of education: Not on file   Highest education level: Not on file  Occupational History   Not on file  Tobacco Use   Smoking status: Former    Packs/day: 1.00    Types: Cigarettes    Quit date: 06/03/2013    Years since quitting: 9.2   Smokeless tobacco: Never  Vaping Use   Vaping Use: Never used  Substance and Sexual Activity   Alcohol use: No   Drug use:  No   Sexual activity: Yes    Birth control/protection: Other-see comments    Comment: had vasectomy  Other Topics Concern   Not on file  Social History Narrative   Not on file   Social Determinants of Health   Financial Resource Strain: Not on file  Food Insecurity: Not on file  Transportation Needs: Not on file  Physical Activity: Not on file  Stress: Not on file  Social Connections: Not on file  Intimate Partner Violence: Not on file    Allergies  Allergen Reactions   Benicar [Olmesartan] Other (See Comments)    REACTION: Unable to regulate blood pressure with  this medication--caused BP levels to drop too low.    Current Outpatient Medications  Medication Sig Dispense Refill   atenolol (TENORMIN) 25 MG tablet Take 25 mg by mouth daily.     ezetimibe (ZETIA) 10 MG tablet Take 10 mg by mouth daily.     HYDROcodone-acetaminophen (NORCO) 10-325 MG tablet Take 1 tablet by mouth 4 (four) times daily.     sildenafil (VIAGRA) 100 MG tablet Take 50-100 mg by mouth daily as needed for erectile dysfunction. Takes 1/2 tablet as needed      zolpidem (AMBIEN) 10 MG tablet Take 5-10 mg by mouth at bedtime as needed for sleep.      Current Facility-Administered Medications  Medication Dose Route Frequency Provider Last Rate Last Admin   cilostazol (PLETAL) tablet 100 mg  100 mg Oral BID Cherre Robins, MD        PHYSICAL EXAM There were no vitals filed for this visit.   Well-appearing man in no acute distress Regular rate and rhythm Unlabored breathing 2+ right radial pulse. Absent left brachial and radial pulse.  PERTINENT LABORATORY AND RADIOLOGIC DATA  Most recent CBC    Latest Ref Rng & Units 12/17/2017    6:12 AM 12/15/2017    1:12 PM 11/28/2017    7:43 PM  CBC  WBC 4.0 - 10.5 K/uL 6.8  9.9  12.3   Hemoglobin 13.0 - 17.0 g/dL 12.2  13.1  14.4   Hematocrit 39.0 - 52.0 % 39.0  41.7  45.7   Platelets 150 - 400 K/uL 207  218  252      Most recent CMP    Latest Ref Rng & Units 12/17/2017    6:12 AM 12/16/2017    6:19 AM 12/15/2017    1:12 PM  CMP  Glucose 65 - 99 mg/dL 112  150  157   BUN 6 - 20 mg/dL 10  9  15    Creatinine 0.61 - 1.24 mg/dL 0.87  0.83  1.09   Sodium 135 - 145 mmol/L 139  138  137   Potassium 3.5 - 5.1 mmol/L 3.7  3.7  4.1   Chloride 101 - 111 mmol/L 105  107  103   CO2 22 - 32 mmol/L 25  21  22    Calcium 8.9 - 10.3 mg/dL 8.4  8.5  8.7   Total Protein 6.5 - 8.1 g/dL   6.4   Total Bilirubin 0.3 - 1.2 mg/dL   0.9   Alkaline Phos 38 - 126 U/L   71   AST 15 - 41 U/L   28   ALT 17 - 63 U/L   35    CT angiogram of the  chest personally reviewed.  This shows significant left subclavian artery stenosis.  There is takeoff of the left vertebral artery directly from the aortic arch  or from a combined origin with the subclavian artery.  Rande Brunt. Lenell Antu, MD [facility] Phone Number: [phone] 09/05/2022 3:54 PM  Total time spent on preparing this encounter including chart review, data review, collecting history, examining the patient, coordinating care for this established patient, 30 minutes  Portions of this report may have been transcribed using voice recognition software.  Every effort has been made to ensure accuracy; however, inadvertent computerized transcription errors may still be present.

## 2022-09-06 ENCOUNTER — Ambulatory Visit: Payer: BLUE CROSS/BLUE SHIELD | Admitting: Vascular Surgery

## 2022-09-06 ENCOUNTER — Ambulatory Visit (HOSPITAL_COMMUNITY)
Admission: RE | Admit: 2022-09-06 | Discharge: 2022-09-06 | Disposition: A | Discharge status: Home / Self Care | Payer: BLUE CROSS/BLUE SHIELD | Source: Ambulatory Visit | Attending: Vascular Surgery | Admitting: Vascular Surgery

## 2022-09-06 ENCOUNTER — Encounter: Payer: Self-pay | Admitting: Vascular Surgery

## 2022-09-06 VITALS — BP 114/78 | HR 58 | Temp 97.9°F | Resp 20 | Ht 68.0 in | Wt 176.0 lb

## 2022-09-06 DIAGNOSIS — I771 Stricture of artery: Secondary | ICD-10-CM

## 2022-09-06 DIAGNOSIS — I739 Peripheral vascular disease, unspecified: Secondary | ICD-10-CM | POA: Insufficient documentation

## 2022-09-07 ENCOUNTER — Other Ambulatory Visit: Payer: Self-pay

## 2022-09-07 DIAGNOSIS — I771 Stricture of artery: Secondary | ICD-10-CM

## 2022-10-07 ENCOUNTER — Ambulatory Visit (HOSPITAL_COMMUNITY)
Admission: RE | Admit: 2022-10-07 | Discharge: 2022-10-07 | Disposition: A | Discharge status: Home / Self Care | Payer: BLUE CROSS/BLUE SHIELD | Attending: Vascular Surgery | Admitting: Vascular Surgery

## 2022-10-07 ENCOUNTER — Ambulatory Visit (HOSPITAL_COMMUNITY)
Admission: RE | Disposition: A | Discharge status: Home / Self Care | Payer: Self-pay | Source: Home / Self Care | Attending: Vascular Surgery

## 2022-10-07 ENCOUNTER — Other Ambulatory Visit: Payer: Self-pay

## 2022-10-07 ENCOUNTER — Encounter (HOSPITAL_COMMUNITY): Payer: Self-pay | Admitting: Vascular Surgery

## 2022-10-07 ENCOUNTER — Other Ambulatory Visit (HOSPITAL_COMMUNITY): Payer: Self-pay

## 2022-10-07 DIAGNOSIS — I70218 Atherosclerosis of native arteries of extremities with intermittent claudication, other extremity: Secondary | ICD-10-CM | POA: Insufficient documentation

## 2022-10-07 DIAGNOSIS — Z87891 Personal history of nicotine dependence: Secondary | ICD-10-CM | POA: Diagnosis not present

## 2022-10-07 DIAGNOSIS — M79602 Pain in left arm: Secondary | ICD-10-CM | POA: Insufficient documentation

## 2022-10-07 DIAGNOSIS — I771 Stricture of artery: Secondary | ICD-10-CM

## 2022-10-07 HISTORY — PX: PERIPHERAL VASCULAR INTERVENTION: CATH118257

## 2022-10-07 HISTORY — PX: AORTIC ARCH ANGIOGRAPHY: CATH118224

## 2022-10-07 LAB — POCT I-STAT, CHEM 8
BUN: 12 mg/dL (ref 6–20)
Calcium, Ion: 1.15 mmol/L (ref 1.15–1.40)
Chloride: 107 mmol/L (ref 98–111)
Creatinine, Ser: 0.9 mg/dL (ref 0.61–1.24)
Glucose, Bld: 121 mg/dL — ABNORMAL HIGH (ref 70–99)
HCT: 43 % (ref 39.0–52.0)
Hemoglobin: 14.6 g/dL (ref 13.0–17.0)
Potassium: 3.6 mmol/L (ref 3.5–5.1)
Sodium: 142 mmol/L (ref 135–145)
TCO2: 25 mmol/L (ref 22–32)

## 2022-10-07 SURGERY — AORTIC ARCH ANGIOGRAPHY
Anesthesia: LOCAL

## 2022-10-07 MED ORDER — SODIUM CHLORIDE 0.9 % IV SOLN: 250.0000 mL | INTRAVENOUS | Status: DC | PRN

## 2022-10-07 MED ORDER — IODIXANOL 320 MG/ML IV SOLN
INTRAVENOUS | Status: DC | PRN
  Administered 2022-10-07: 100 mL via INTRA_ARTERIAL

## 2022-10-07 MED ORDER — CLOPIDOGREL BISULFATE 300 MG PO TABS
ORAL_TABLET | ORAL | Status: DC | PRN
  Administered 2022-10-07: 300 mg via ORAL

## 2022-10-07 MED ORDER — SODIUM CHLORIDE 0.9% FLUSH: 3.0000 mL | INTRAVENOUS | Status: DC | PRN

## 2022-10-07 MED ORDER — SODIUM CHLORIDE 0.9 % IV SOLN: INTRAVENOUS | Status: DC

## 2022-10-07 MED ORDER — ATORVASTATIN CALCIUM 40 MG PO TABS
40.0000 mg | ORAL_TABLET | Freq: Every day | ORAL | 11 refills | Status: DC
  Filled 2022-10-07: qty 30, 30d supply, fill #0

## 2022-10-07 MED ORDER — MIDAZOLAM HCL 2 MG/2ML IJ SOLN
INTRAMUSCULAR | Status: DC | PRN
  Administered 2022-10-07 (×2): 1 mg via INTRAVENOUS

## 2022-10-07 MED ORDER — LABETALOL HCL 5 MG/ML IV SOLN: 10.0000 mg | INTRAVENOUS | Status: DC | PRN

## 2022-10-07 MED ORDER — PROTAMINE SULFATE 10 MG/ML IV SOLN
INTRAVENOUS | Status: DC | PRN
  Administered 2022-10-07: 5 mg via INTRAVENOUS
  Administered 2022-10-07: 45 mg via INTRAVENOUS

## 2022-10-07 MED ORDER — ATORVASTATIN CALCIUM 40 MG PO TABS
40.0000 mg | ORAL_TABLET | Freq: Every day | ORAL | Status: DC
  Administered 2022-10-07: 40 mg via ORAL
  Filled 2022-10-07: qty 1

## 2022-10-07 MED ORDER — LIDOCAINE HCL (PF) 1 % IJ SOLN
INTRAMUSCULAR | Status: AC
  Filled 2022-10-07: qty 30

## 2022-10-07 MED ORDER — CLOPIDOGREL BISULFATE 75 MG PO TABS: 75.0000 mg | ORAL_TABLET | Freq: Every day | ORAL | Status: DC

## 2022-10-07 MED ORDER — HEPARIN (PORCINE) IN NACL 1000-0.9 UT/500ML-% IV SOLN
INTRAVENOUS | Status: AC
  Filled 2022-10-07: qty 1000

## 2022-10-07 MED ORDER — HEPARIN SODIUM (PORCINE) 1000 UNIT/ML IJ SOLN
INTRAMUSCULAR | Status: DC | PRN
  Administered 2022-10-07: 8000 [IU] via INTRAVENOUS

## 2022-10-07 MED ORDER — SODIUM CHLORIDE 0.9% FLUSH: 3.0000 mL | Freq: Two times a day (BID) | INTRAVENOUS | Status: DC

## 2022-10-07 MED ORDER — ACETAMINOPHEN 325 MG PO TABS
650.0000 mg | ORAL_TABLET | ORAL | Status: DC | PRN
  Administered 2022-10-07: 650 mg via ORAL
  Filled 2022-10-07: qty 2

## 2022-10-07 MED ORDER — MIDAZOLAM HCL 2 MG/2ML IJ SOLN
INTRAMUSCULAR | Status: AC
  Filled 2022-10-07: qty 2

## 2022-10-07 MED ORDER — HEPARIN SODIUM (PORCINE) 1000 UNIT/ML IJ SOLN
INTRAMUSCULAR | Status: AC
  Filled 2022-10-07: qty 10

## 2022-10-07 MED ORDER — CLOPIDOGREL BISULFATE 75 MG PO TABS
75.0000 mg | ORAL_TABLET | Freq: Every day | ORAL | 11 refills | Status: AC
  Filled 2022-10-07: qty 30, 30d supply, fill #0

## 2022-10-07 MED ORDER — PROTAMINE SULFATE 10 MG/ML IV SOLN
INTRAVENOUS | Status: AC
  Filled 2022-10-07: qty 5

## 2022-10-07 MED ORDER — CLOPIDOGREL BISULFATE 75 MG PO TABS: 300.0000 mg | ORAL_TABLET | Freq: Once | ORAL | Status: DC

## 2022-10-07 MED ORDER — HYDRALAZINE HCL 20 MG/ML IJ SOLN: 5.0000 mg | INTRAMUSCULAR | Status: DC | PRN

## 2022-10-07 MED ORDER — FENTANYL CITRATE (PF) 100 MCG/2ML IJ SOLN
INTRAMUSCULAR | Status: AC
  Filled 2022-10-07: qty 2

## 2022-10-07 MED ORDER — ONDANSETRON HCL 4 MG/2ML IJ SOLN: 4.0000 mg | Freq: Four times a day (QID) | INTRAMUSCULAR | Status: DC | PRN

## 2022-10-07 MED ORDER — FENTANYL CITRATE (PF) 100 MCG/2ML IJ SOLN
INTRAMUSCULAR | Status: DC | PRN
  Administered 2022-10-07 (×3): 50 ug via INTRAVENOUS

## 2022-10-07 MED ORDER — SODIUM CHLORIDE 0.9 % WEIGHT BASED INFUSION: 1.0000 mL/kg/h | INTRAVENOUS | Status: DC

## 2022-10-07 MED ORDER — CLOPIDOGREL BISULFATE 300 MG PO TABS
ORAL_TABLET | ORAL | Status: AC
  Filled 2022-10-07: qty 1

## 2022-10-07 SURGICAL SUPPLY — 17 items
CATH ANGIO 5F BER2 100CM (CATHETERS) IMPLANT
CATH ANGIO 5F PIGTAIL 100CM (CATHETERS) IMPLANT
CLOSURE PERCLOSE PROSTYLE (VASCULAR PRODUCTS) IMPLANT
GLIDEWIRE ADV .035X260CM (WIRE) IMPLANT
KIT ENCORE 26 ADVANTAGE (KITS) IMPLANT
KIT MICROPUNCTURE NIT STIFF (SHEATH) IMPLANT
KIT PV (KITS) ×2 IMPLANT
SHEATH CHECKFLO FLEXOR 8F 45CM (SHEATH) IMPLANT
SHEATH PINNACLE 5F 10CM (SHEATH) IMPLANT
SHEATH PROBE COVER 6X72 (BAG) IMPLANT
STENT VIABAHNBX 9X29X135 (Permanent Stent) IMPLANT
STOPCOCK MORSE 400PSI 3WAY (MISCELLANEOUS) IMPLANT
SYR MEDRAD MARK V 150ML (SYRINGE) IMPLANT
TRANSDUCER W/STOPCOCK (MISCELLANEOUS) ×2 IMPLANT
TRAY PV CATH (CUSTOM PROCEDURE TRAY) ×2 IMPLANT
TUBING CIL FLEX 10 FLL-RA (TUBING) IMPLANT
WIRE BENTSON .035X145CM (WIRE) IMPLANT

## 2022-10-07 NOTE — Op Note (Signed)
DATE OF SERVICE: 10/07/2022  PATIENT:  Preston Davis  51 y.o. male  PRE-OPERATIVE DIAGNOSIS:  Atherosclerosis of native arteries of left subclavian artery causing disabling claudication  POST-OPERATIVE DIAGNOSIS:  Same  PROCEDURE:   1) Ultrasound guided right common femoral artery access 2) Aortogram 3) Left upper extremity angiogram with second order cannulation (178m total contrast) 4) Left subclavian angioplasty and stenting ([id number]) 5) Conscious sedation (46 minutes)  SURGEON:  ThYevonne AlineHaStanford BreedMD  ASSISTANT: none  ANESTHESIA:   local and MAC  ESTIMATED BLOOD LOSS: minimal  LOCAL MEDICATIONS USED:  LIDOCAINE   COUNTS: confirmed correct.  PATIENT DISPOSITION:  PACU - hemodynamically stable.   Delay start of Pharmacological VTE agent (>24hrs) due to surgical blood loss or risk of bleeding: no  INDICATION FOR PROCEDURE: Preston Davis a [age].o. male with left subclavian artery stenosis causing severe claudication. After careful discussion of risks, benefits, and alternatives the patient was offered arch angiography with possible subclavian artery stenting.   OPERATIVE FINDINGS:  Type I aortic arch.  Left vertebral artery originates directly from the arch 2 to 3 mm away from origin of left subclavian artery.  Severe left subclavian artery stenosis 2 to 3 mm distal to the ostia.  Good technical result achieved from covered stenting with 9 x 29 mm VBX.  Restoration of palpable pulse in the left arm.  DESCRIPTION OF PROCEDURE: After identification of the patient in the pre-operative holding area, the patient was transferred to the operating room. The patient was positioned supine on the operating room table.  Anesthesia was induced. The groins was prepped and draped in standard fashion. A surgical pause was performed confirming correct patient, procedure, and operative location.  The right groin was anesthetized with subcutaneous injection of 1% lidocaine. Using ultrasound  guidance, the right common femoral artery was accessed with micropuncture technique. Fluoroscopy was used to confirm cannulation over the femoral head. The 24F sheath was upsized to 66F.   A Benson wire was advanced into the ascending aorta.  Over the wire a pigtail catheter was advanced into the ascending aorta.  The wire was withdrawn.  All bubbles were removed from the system.  A powered injection arch angiogram was performed and left oblique position.  The anatomy was visualized in the left subclavian artery marked.  The patient was systemically heparinized.  The lesion was crossed with a Glidewire advantage and Berenstein 2 catheter.  A confirmatory angiogram was performed to confirm selection of the left subclavian artery.  Our access was upsized to 8 FrPakistany 45 cm.  This did not reach the ostium of the left subclavian artery. The screen was marked with the ostia of the artery and thyrocervical trunk. Bony landmarks were used to place a 9 x 2962mtent across the lesion. This was deployed without difficulty.  The balloon was withdrawn over the wire.  I advanced a Berenstein 2 catheter into the stent and performed angiogram.  This revealed good distal apposition.  Satisfied I lost wire access and advanced a wire into the ascending aorta.  The catheter was exchanged for a pigtail catheter.  An completion arch angiogram was performed.  This showed good technical result from stenting.  All endovascular equipment was removed.  The right common femoral arteriotomy was closed with a Perclose device.  Good hemostasis was achieved.  Heparin was reversed with protamine.  Conscious sedation was administered with the use of IV fentanyl and midazolam under continuous physician and nurse monitoring.  Heart rate, blood pressure, and oxygen saturation were continuously monitored.  Total sedation time was 46 minutes  Upon completion of the case instrument and sharps counts were confirmed correct. The patient was  transferred to the PACU in good condition. I was present for all portions of the procedure.  PLAN: ASA 15m PO QD. Clopidogrel 755mPO QD x 1 month minimum, ideally 12 months total. High intensity statin therapy. Follow up with me in 1 month with LUE duplex.   ThYevonne AlineHaStanford BreedMD [facility] Number: ([phone]/02/2022 8:37 AM

## 2022-10-07 NOTE — Progress Notes (Signed)
post-ambulation done, pt tolerated well. no s/s of bleeding or an hematoma noted. will continue to monitor until d/c

## 2022-10-07 NOTE — H&P (Signed)
[facility] [place]  ASSESSMENT / PLAN: Preston Davis is a 51 y.o. 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   Patient counseled patients with asymptomatic peripheral arterial disease or claudication have a 1-2% risk of developing chronic limb threatening ischemia, but a 15-30% risk of mortality in the next 5 years. Intervention should only be considered for medically optimized patients with disabling symptoms.   Recommend the following which can slow the progression of atherosclerosis and reduce the risk of major adverse cardiac / limb events:  Complete cessation from all tobacco products. Blood glucose control with goal A1c < 7%. Blood pressure control with goal blood pressure < 140/90 mmHg. Lipid reduction therapy with goal LDL-C <100 mg/dL (<70 if symptomatic from PAD).  Aspirin 81mg  PO QD.  Cilostozal 100mg  PO BID for intermittent claudication without evidence of heart failure. Atorvastatin 40-80mg  PO QD (or other "high intensity" statin therapy).  Plan arch angiography, possible left subclavian artery stenting in cath lab today 10/07/22  CHIEF COMPLAINT: Left arm pain  HISTORY OF PRESENT ILLNESS: Preston Davis is a 51 y.o. male who presents to clinic for evaluation of left arm pain with overhead activity.  The patient has had an extensive work-up by his primary care physician, which has identified severe left subclavian artery stenosis.  The patient reports a fairly classic history of upper extremity claudication type symptoms.  He has easy fatigability, and pain with overhead and strenuous activity left upper extremity.  [name], but does write with his right hand.  We had a long discussion about the risk, benefits, and alternatives of an intervention.   09/06/22: Patient returns to clinic to clinic.  He would like to proceed with stenting as he does not feel his symptoms are  getting any better.  10/07/22: patient presents for angiogram.  Past Medical History:  Diagnosis Date   Acid reflux    C. difficile colitis    DVT (deep venous thrombosis) ([place]) 2014   left leg   Factor 5 Leiden mutation, heterozygous ([place])    Heart murmur    High blood cholesterol level    Hypertension    Right shoulder pain     Past Surgical History:  Procedure Laterality Date   APPENDECTOMY     bone graft  right great toe     for bunion   SHOULDER OPEN ROTATOR CUFF REPAIR Right 05/14/2013   Procedure: ROTATOR CUFF REPAIR SHOULDER OPEN;  Surgeon: Sanjuana Kava, MD;  Location: [facility];  Service: Orthopedics;  Laterality: Right;   VASECTOMY      Family History  Problem Relation Age of Onset   Clotting disorder Mother    Diabetes Paternal Grandmother     Social History   Socioeconomic History   Marital status: Married    Spouse name: Not on file   Number of children: Not on file   Years of education: Not on file   Highest education level: Not on file  Occupational History   Not on file  Tobacco Use   Smoking status: Former    Packs/day: 1.00    Types: Cigarettes    Quit date: 06/03/2013    Years since quitting: 9.3   Smokeless tobacco: Never  Vaping Use   Vaping Use: Never used  Substance and Sexual Activity   Alcohol use: No   Drug use: No   Sexual activity: Yes    Birth control/protection: Other-see comments  Comment: had vasectomy  Other Topics Concern   Not on file  Social History Narrative   Not on file   Social Determinants of Health   Financial Resource Strain: Not on file  Food Insecurity: Not on file  Transportation Needs: Not on file  Physical Activity: Not on file  Stress: Not on file  Social Connections: Not on file  Intimate Partner Violence: Not on file    Allergies  Allergen Reactions   Benicar [Olmesartan] Other (See Comments)    REACTION: Unable to regulate blood pressure with this medication--caused BP levels to drop too low.     Current Facility-Administered Medications  Medication Dose Route Frequency Provider Last Rate Last Admin   0.9 %  sodium chloride infusion   Intravenous Continuous Leonie Douglas, MD 100 mL/hr at 10/07/22 0606 New Bag at 10/07/22 0606    PHYSICAL EXAM Vitals:   10/07/22 0550  BP: (!) 146/87  Pulse: 61  Resp: 17  Temp: (!) 97.4 F (36.3 C)  TempSrc: Temporal  SpO2: 99%  Weight: 79.4 kg  Height: 5\' 8"  (1.727 m)     Well-appearing man in no acute distress Regular rate and rhythm Unlabored breathing 2+ right radial pulse. Absent left brachial and radial pulse.  PERTINENT LABORATORY AND RADIOLOGIC DATA  Most recent CBC    Latest Ref Rng & Units 10/07/2022    5:58 AM 12/17/2017    6:12 AM 12/15/2017    1:12 PM  CBC  WBC 4.0 - 10.5 K/uL  6.8  9.9   Hemoglobin 13.0 - 17.0 g/dL 02/12/2018  37.9  02.4   Hematocrit 39.0 - 52.0 % 43.0  39.0  41.7   Platelets 150 - 400 K/uL  207  218      Most recent CMP    Latest Ref Rng & Units 10/07/2022    5:58 AM 12/17/2017    6:12 AM 12/16/2017    6:19 AM  CMP  Glucose 70 - 99 mg/dL 02/13/2018  353  299   BUN 6 - 20 mg/dL 12  10  9    Creatinine 0.61 - 1.24 mg/dL 242   6.83   Sodium 135 - 145 mmol/L 142  139  138   Potassium 3.5 - 5.1 mmol/L 3.6  3.7  3.7   Chloride 98 - 111 mmol/L 107  105  107   CO2 22 - 32 mmol/L  25  21   Calcium 8.9 - 10.3 mg/dL  8.4  8.5    CT angiogram of the chest personally reviewed.  This shows significant left subclavian artery stenosis.  There is takeoff of the left vertebral artery directly from the aortic arch or from a combined origin with the subclavian artery.  4.19. 6.22, MD [facility] Phone Number: [phone] 10/07/2022 7:27 AM  Total time spent on preparing this encounter including chart review, data review, collecting history, examining the patient, coordinating care for this established patient, 30 minutes  Portions of this report may have been  transcribed using voice recognition software.  Every effort has been made to ensure accuracy; however, inadvertent computerized transcription errors may still be present.

## 2022-10-07 NOTE — Progress Notes (Signed)
Pt and wife received all d/c instructions written and verbal. All questions and concerns addressed. PIV removed and intact. R groin dressing intact, no bleeding and hematoma noted to site. Pt d/c in stable condition.

## 2022-10-10 ENCOUNTER — Ambulatory Visit: Payer: BLUE CROSS/BLUE SHIELD | Admitting: Physician Assistant

## 2022-10-10 ENCOUNTER — Encounter (HOSPITAL_COMMUNITY): Payer: Self-pay | Admitting: Vascular Surgery

## 2022-10-10 ENCOUNTER — Encounter: Payer: Self-pay | Admitting: Vascular Surgery

## 2022-10-10 VITALS — BP 130/88 | HR 51 | Temp 97.3°F | Resp 20 | Ht 68.0 in | Wt 175.1 lb

## 2022-10-10 DIAGNOSIS — I771 Stricture of artery: Secondary | ICD-10-CM | POA: Diagnosis not present

## 2022-10-10 DIAGNOSIS — I739 Peripheral vascular disease, unspecified: Secondary | ICD-10-CM | POA: Diagnosis not present

## 2022-10-10 MED FILL — Heparin Sod (Porcine)-NaCl IV Soln 1000 Unit/500ML-0.9%: INTRAVENOUS | Qty: 1000 | Status: AC

## 2022-10-10 MED FILL — Lidocaine HCl Local Preservative Free (PF) Inj 1%: INTRAMUSCULAR | Qty: 30 | Status: AC

## 2022-10-10 NOTE — Progress Notes (Signed)
Office Note     CC:  follow up Requesting Provider:  Elfredia Nevins, MD  HPI: Preston Davis is a 51 y.o. (1970/12/31) male who presents status post left subclavian artery angioplasty and stenting by Dr. Lenell Antu on 10/07/2022.  This was performed due to disabling left arm claudication.  The patient called the on-call surgeon over the weekend due to bruising in the left upper arm and was brought in for an office visit today.  He has bruising along the medial aspect of his left upper arm.  He also has bruising in the right groin catheterization site.  He denies any loss in grip strength or pain in his left hand.  He also does not feel like his left arm or forearm are tight.  He also denies rest pain in his right foot.  He is taking aspirin and Plavix daily.    Past Medical History:  Diagnosis Date   Acid reflux    C. difficile colitis    DVT (deep venous thrombosis) ([facility]) 2014   left leg   Factor 5 Leiden mutation, heterozygous ([facility])    Heart murmur    High blood cholesterol level    Hypertension    Right shoulder pain     Past Surgical History:  Procedure Laterality Date   AORTIC ARCH ANGIOGRAPHY N/A 10/07/2022   Procedure: AORTIC ARCH ANGIOGRAPHY;  Surgeon: Leonie Douglas, MD;  Location: [facility];  Service: Cardiovascular;  Laterality: N/A;   APPENDECTOMY     bone graft  right great toe     for bunion   PERIPHERAL VASCULAR INTERVENTION Left 10/07/2022   Procedure: PERIPHERAL VASCULAR INTERVENTION;  Surgeon: Leonie Douglas, MD;  Location: [facility];  Service: Cardiovascular;  Laterality: Left;  Subclavian   SHOULDER OPEN ROTATOR CUFF REPAIR Right 05/14/2013   Procedure: ROTATOR CUFF REPAIR SHOULDER OPEN;  Surgeon: Darreld Mclean, MD;  Location: [facility];  Service: Orthopedics;  Laterality: Right;   VASECTOMY      Social History   Socioeconomic History   Marital status: Married    Spouse name: Not on file   Number of children: Not on file   Years of education:  Not on file   Highest education level: Not on file  Occupational History   Not on file  Tobacco Use   Smoking status: Former    Packs/day: 1.00    Types: Cigarettes    Quit date: 06/03/2013    Years since quitting: 9.3    Passive exposure: Never   Smokeless tobacco: Never  Vaping Use   Vaping Use: Never used  Substance and Sexual Activity   Alcohol use: No   Drug use: No   Sexual activity: Yes    Birth control/protection: Other-see comments    Comment: had vasectomy  Other Topics Concern   Not on file  Social History Narrative   Not on file   Social Determinants of Health   Financial Resource Strain: Not on file  Food Insecurity: Not on file  Transportation Needs: Not on file  Physical Activity: Not on file  Stress: Not on file  Social Connections: Not on file  Intimate Partner Violence: Not on file    Family History  Problem Relation Age of Onset   Clotting disorder Mother    Diabetes Paternal Grandmother     Current Outpatient Medications  Medication Sig Dispense Refill   ascorbic acid (VITAMIN C) 500 MG tablet Take 500 mg by mouth daily.  aspirin EC 81 MG tablet Take 81 mg by mouth daily. Swallow whole.     atenolol (TENORMIN) 25 MG tablet Take 25 mg by mouth daily.     atorvastatin (LIPITOR) 40 MG tablet Take 1 tablet (40 mg total) by mouth daily. 30 tablet 11   benzonatate (TESSALON) 100 MG capsule Take 100-200 mg by mouth 3 (three) times daily as needed for cough.     clopidogrel (PLAVIX) 75 MG tablet Take 1 tablet (75 mg total) by mouth daily. 30 tablet 11   ezetimibe (ZETIA) 10 MG tablet Take 10 mg by mouth daily.     HYDROcodone-acetaminophen (NORCO) 10-325 MG tablet Take 1 tablet by mouth every 6 (six) hours.     sildenafil (VIAGRA) 100 MG tablet Take 50-100 mg by mouth daily as needed for erectile dysfunction.     zolpidem (AMBIEN) 10 MG tablet Take 10 mg by mouth at bedtime.     No current facility-administered medications for this visit.     Allergies  Allergen Reactions   Benicar [Olmesartan] Other (See Comments)    REACTION: Unable to regulate blood pressure with this medication--caused BP levels to drop too low.     REVIEW OF SYSTEMS:   [X]  denotes positive finding, [ ]  denotes negative finding Cardiac  Comments:  Chest pain or chest pressure:    Shortness of breath upon exertion:    Short of breath when lying flat:    Irregular heart rhythm:        Vascular    Pain in calf, thigh, or hip brought on by ambulation:    Pain in feet at night that wakes you up from your sleep:     Blood clot in your veins:    Leg swelling:         Pulmonary    Oxygen at home:    Productive cough:     Wheezing:         Neurologic    Sudden weakness in arms or legs:     Sudden numbness in arms or legs:     Sudden onset of difficulty speaking or slurred speech:    Temporary loss of vision in one eye:     Problems with dizziness:         Gastrointestinal    Blood in stool:     Vomited blood:         Genitourinary    Burning when urinating:     Blood in urine:        Psychiatric    Major depression:         Hematologic    Bleeding problems:    Problems with blood clotting too easily:        Skin    Rashes or ulcers:        Constitutional    Fever or chills:      PHYSICAL EXAMINATION:  Vitals:   10/10/22 1238  BP: 130/88  Pulse: (!) 51  Resp: 20  Temp: (!) 97.3 F (36.3 C)  TempSrc: Temporal  SpO2: 98%  Weight: 175 lb 1.6 oz (79.4 kg)  Height: 5\' 8"  (1.727 m)    General:  WDWN in NAD; vital signs documented above Gait: Not observed HENT: WNL, normocephalic Pulmonary: normal non-labored breathing , without Rales, rhonchi,  wheezing Cardiac: regular HR Abdomen: soft, NT, no masses Skin: without rashes Vascular Exam/Pulses: Palpable left brachial pulse, palpable left radial pulse Palpable right DP pulse Extremities: Extensive bruising of the left medial  upper arm however compartments in the  upper arm and forearm are soft and grip strength is symmetrical; there is no skin compromise or firm hematoma; right groin catheterization site with bruising but is soft without firm hematoma Musculoskeletal: no muscle wasting or atrophy  Neurologic: A&O X 3;  No focal weakness or paresthesias are detected Psychiatric:  The pt has Normal affect.    ASSESSMENT/PLAN:: 51 y.o. male status post left subclavian artery ballooning and stenting with bruising of the left upper arm as well as right groin catheterization site  -Left hand is well-perfused with a 2+ palpable radial pulse -There is significant bruising in the left medial upper arm however I do not feel a firm hematoma in all compartments of the upper arm and forearm are soft.  Recommendations include elevation of the arm, exercising the left hand, and using a warm compress to help speed reabsorption of the bruising. -Right groin catheterization site is also soft without a firm hematoma.  He has an easily palpable 2+ DP pulse he does not have any ischemic symptoms of the right foot -We discussed signs and symptoms to look for if bruising worsens.  He will return in 2 to 3 weeks with left upper extremity arterial duplex and to make sure bruising is improving   Dagoberto Ligas, PA-C [facility] [phone]  Clinic MD:   Trula Slade

## 2022-10-12 ENCOUNTER — Telehealth (HOSPITAL_COMMUNITY): Payer: Self-pay

## 2022-10-12 ENCOUNTER — Other Ambulatory Visit (HOSPITAL_COMMUNITY): Payer: Self-pay

## 2022-10-12 NOTE — Telephone Encounter (Signed)
Pharmacy Transitions of Care Follow-up Telephone Call  Date of discharge: 10/07/22  Discharge Diagnosis: Left subclavian stenosis and stenting  How have you been since you were released from the hospital? Doing well since stenting, had to have a quick follow up with vascular as he was experiencing some bruising in his arm and his groin near the catheter insertion site. Educated on the importance of taking his clopidogrel and aspirin daily and educated on atorvastatin and benefit of taking it at bedtime.   Medication changes made at discharge: START taking these medications  START taking these medications atorvastatin 40 MG tablet Commonly known as: Lipitor Take 1 tablet (40 mg total) by mouth daily.  clopidogrel 75 MG tablet Commonly known as: Plavix Take 1 tablet (75 mg total) by mouth daily.    Medication changes verified by the patient? Yes (Yes/No)    Medication Accessibility:  Home Pharmacy: [facility]   Was the patient provided with refills on discharged medications? Yes on both    Have all prescriptions been transferred from [facility] to home pharmacy? Yes   Is the patient interested in using a [place] pharmacy? No  Medication Review:  CLOPIDOGREL (PLAVIX) Clopidogrel 75 mg once daily initiated on 10/07/22 for PAD with stenting - Discussed importance of taking medication around the same time every day.  - Advised patient of medications to avoid (NSAIDs, ASA maintenance doses>100 mg daily)  - Educated that Tylenol (acetaminophen) will be the preferred analgesic to prevent risk of bleeding. - Emphasized importance of monitoring for signs and symptoms of bleeding (abnormal bruising, prolonged bleeding, nose bleeds, bleeding from gums, discolored urine, black tarry stools)  - Advised patient to alert all providers of anticoagulation therapy prior to starting a new medication or having a procedure.  Follow-up Appointments:  [facility] Hospital f/u appt confirmed? Yes  Saw Emilie Rutter PA-C in Vascular Surgery on 10/10/22 at 12:45 Scheduled to see Vascular surgery again on 11/01/22 at 9:30  Encouraged to schedule follow-up with PCP, has not scheduled yet.  Sherald Hess, PharmD Candidate 10/12/2022 11:13 AM

## 2022-10-13 ENCOUNTER — Other Ambulatory Visit: Payer: Self-pay

## 2022-10-13 DIAGNOSIS — I771 Stricture of artery: Secondary | ICD-10-CM

## 2022-10-18 ENCOUNTER — Ambulatory Visit: Payer: BLUE CROSS/BLUE SHIELD | Admitting: Vascular Surgery

## 2022-10-18 ENCOUNTER — Encounter (HOSPITAL_COMMUNITY): Payer: BLUE CROSS/BLUE SHIELD

## 2022-10-28 ENCOUNTER — Other Ambulatory Visit (HOSPITAL_COMMUNITY): Payer: Self-pay

## 2022-10-31 NOTE — Progress Notes (Unsigned)
[facility]   ASSESSMENT / PLAN: Preston Davis is a 51 y.o. male status post left subclavian artery stenting 10/07/22 for disabling claudication. Good technical result achieved.  Recommend the following which can slow the progression of atherosclerosis and reduce the risk of major adverse cardiac / limb events:  Complete cessation from all tobacco products. Blood glucose control with goal A1c < 7%. Blood pressure control with goal blood pressure < 140/90 mmHg. Lipid reduction therapy with goal LDL-C <100 mg/dL (<40 if symptomatic from PAD).  Aspirin 81mg  PO QD.  Plavix 75mg  PO QD x 12 months total (stop date 10/08/23) Atorvastatin 40-80mg  PO QD (or other "high intensity" statin therapy).  Left arm pain much better. Palpable pulse in the wrist. Having some pain in the left shoulder with overhead movement. Will refer to Dr. .   CHIEF COMPLAINT: Left arm pain  HISTORY OF PRESENT ILLNESS: Preston Davis is a 51 y.o. male who presents to clinic for evaluation of left arm pain with overhead activity.  The patient has had an extensive work-up by his primary care physician, which has identified severe left subclavian artery stenosis.  The patient reports a fairly classic history of upper extremity claudication type symptoms.  He has easy fatigability, and pain with overhead and strenuous activity left upper extremity.  [name], but does write with his right hand.  We had a long discussion about the risk, benefits, and alternatives of an intervention.   09/06/22: Patient returns to clinic to clinic.  He would like to proceed with stenting as he does not feel his symptoms are getting any better.  11/01/22: Returns for follow-up evaluation after left subclavian artery stenting.  Left arm pain much improved.  He does have some shoulder discomfort with overhead activity and heavy lifting.  He has a history of rotator cuff issues on the  right side.  Past Medical History:  Diagnosis Date   Acid reflux    C. difficile colitis    DVT (deep venous thrombosis) ([facility]) 2014   left leg   Factor 5 Leiden mutation, heterozygous ([facility])    Heart murmur    High blood cholesterol level    Hypertension    Right shoulder pain     Past Surgical History:  Procedure Laterality Date   AORTIC ARCH ANGIOGRAPHY N/A 10/07/2022   Procedure: AORTIC ARCH ANGIOGRAPHY;  Surgeon: 11/03/22, MD;  [facility]: [facility];  Service: Cardiovascular;  Laterality: N/A;   APPENDECTOMY     bone graft  right great toe     for bunion   PERIPHERAL VASCULAR INTERVENTION Left 10/07/2022   Procedure: PERIPHERAL VASCULAR INTERVENTION;  Surgeon: Leonie Douglas, MD;  Location: [facility];  Service: Cardiovascular;  Laterality: Left;  Subclavian   SHOULDER OPEN ROTATOR CUFF REPAIR Right 05/14/2013   Procedure: ROTATOR CUFF REPAIR SHOULDER OPEN;  Surgeon: Leonie Douglas, MD;  Location: [facility];  Service: Orthopedics;  Laterality: Right;   VASECTOMY      Family History  Problem Relation Age of Onset   Clotting disorder Mother    Diabetes Paternal Grandmother     Social History   Socioeconomic History   Marital status: Married    Spouse name: Not on file   Number of children: Not on file   Years of education: Not on file   Highest education level: Not on file  Occupational History   Not on file  Tobacco Use  Smoking status: Former    Packs/day: 1.00    Types: Cigarettes    Quit date: 06/03/2013    Years since quitting: 9.4    Passive exposure: Never   Smokeless tobacco: Never  Vaping Use   Vaping Use: Never used  Substance and Sexual Activity   Alcohol use: No   Drug use: No   Sexual activity: Yes    Birth control/protection: Other-see comments    Comment: had vasectomy  Other Topics Concern   Not on file  Social History Narrative   Not on file   Social Determinants of Health   Financial Resource Strain: Not on file   Food Insecurity: Not on file  Transportation Needs: Not on file  Physical Activity: Not on file  Stress: Not on file  Social Connections: Not on file  Intimate Partner Violence: Not on file    Allergies  Allergen Reactions   Benicar [Olmesartan] Other (See Comments)    REACTION: Unable to regulate blood pressure with this medication--caused BP levels to drop too low.    Current Outpatient Medications  Medication Sig Dispense Refill   ascorbic acid (VITAMIN C) 500 MG tablet Take 500 mg by mouth daily.     aspirin EC 81 MG tablet Take 81 mg by mouth daily. Swallow whole.     atenolol (TENORMIN) 25 MG tablet Take 25 mg by mouth daily.     atorvastatin (LIPITOR) 40 MG tablet Take 1 tablet (40 mg total) by mouth daily. 30 tablet 11   benzonatate (TESSALON) 100 MG capsule Take 100-200 mg by mouth 3 (three) times daily as needed for cough.     clopidogrel (PLAVIX) 75 MG tablet Take 1 tablet (75 mg total) by mouth daily. 30 tablet 11   ezetimibe (ZETIA) 10 MG tablet Take 10 mg by mouth daily.     HYDROcodone-acetaminophen (NORCO) 10-325 MG tablet Take 1 tablet by mouth every 6 (six) hours.     sildenafil (VIAGRA) 100 MG tablet Take 50-100 mg by mouth daily as needed for erectile dysfunction.     zolpidem (AMBIEN) 10 MG tablet Take 10 mg by mouth at bedtime.     No current facility-administered medications for this visit.    PHYSICAL EXAM There were no vitals filed for this visit.   Well-appearing man in no acute distress Regular rate and rhythm Unlabored breathing 2+ right radial pulse. Absent left brachial and radial pulse.  PERTINENT LABORATORY AND RADIOLOGIC DATA  Most recent CBC    Latest Ref Rng & Units 10/07/2022    5:58 AM 12/17/2017    6:12 AM 12/15/2017    1:12 PM  CBC  WBC 4.0 - 10.5 K/uL  6.8  9.9   Hemoglobin 13.0 - 17.0 g/dL 35.0  09.3  81.8   Hematocrit 39.0 - 52.0 % 43.0  39.0  41.7   Platelets 150 - 400 K/uL  207  218      Most recent CMP    Latest Ref  Rng & Units 10/07/2022    5:58 AM 12/17/2017    6:12 AM 12/16/2017    6:19 AM  CMP  Glucose 70 - 99 mg/dL 299  371  696   BUN 6 - 20 mg/dL 12  10  9    Creatinine 0.61 - 1.24 mg/dL  7.89  3.81   Sodium 135 - 145 mmol/L 142  139  138   Potassium 3.5 - 5.1 mmol/L 3.6  3.7  3.7   Chloride 98 - 111  mmol/L 107  105  107   CO2 22 - 32 mmol/L  25  21   Calcium 8.9 - 10.3 mg/dL  8.4  8.5    Angiogram and intervention reviewed today.  Good technical result from left subclavian artery stenting.  Duplex ultrasound of the left upper extremity shows no flow-limiting stenosis in the left upper extremity.  Triphasic flow throughout the visualized arteries.  Rande Brunt. Lenell Antu, MD [facility] Phone Number: [phone] 10/31/2022 7:25 PM  Total time spent on preparing this encounter including chart review, data review, collecting history, examining the patient, coordinating care for this established patient, 20 minutes  Portions of this report may have been transcribed using voice recognition software.  Every effort has been made to ensure accuracy; however, inadvertent computerized transcription errors may still be present.

## 2022-11-01 ENCOUNTER — Encounter: Payer: Self-pay | Admitting: Vascular Surgery

## 2022-11-01 ENCOUNTER — Ambulatory Visit: Payer: BLUE CROSS/BLUE SHIELD | Admitting: Vascular Surgery

## 2022-11-01 ENCOUNTER — Ambulatory Visit (HOSPITAL_COMMUNITY)
Admission: RE | Admit: 2022-11-01 | Discharge: 2022-11-01 | Disposition: A | Discharge status: Home / Self Care | Payer: BLUE CROSS/BLUE SHIELD | Source: Ambulatory Visit | Attending: Vascular Surgery | Admitting: Vascular Surgery

## 2022-11-01 VITALS — BP 121/81 | HR 58 | Temp 98.5°F | Resp 20 | Ht 68.0 in | Wt 183.0 lb

## 2022-11-01 DIAGNOSIS — I771 Stricture of artery: Secondary | ICD-10-CM | POA: Diagnosis present

## 2022-11-01 DIAGNOSIS — I739 Peripheral vascular disease, unspecified: Secondary | ICD-10-CM | POA: Diagnosis not present

## 2022-11-04 ENCOUNTER — Ambulatory Visit (INDEPENDENT_AMBULATORY_CARE_PROVIDER_SITE_OTHER): Payer: BLUE CROSS/BLUE SHIELD

## 2022-11-04 ENCOUNTER — Ambulatory Visit (INDEPENDENT_AMBULATORY_CARE_PROVIDER_SITE_OTHER): Payer: BLUE CROSS/BLUE SHIELD | Admitting: Orthopaedic Surgery

## 2022-11-04 DIAGNOSIS — G8929 Other chronic pain: Secondary | ICD-10-CM

## 2022-11-04 DIAGNOSIS — M25512 Pain in left shoulder: Secondary | ICD-10-CM

## 2022-11-04 MED ORDER — TRIAMCINOLONE ACETONIDE 40 MG/ML IJ SUSP
80.0000 mg | INTRAMUSCULAR | Status: AC | PRN
  Administered 2022-11-04: 80 mg via INTRA_ARTICULAR

## 2022-11-04 MED ORDER — LIDOCAINE HCL 1 % IJ SOLN
4.0000 mL | INTRAMUSCULAR | Status: AC | PRN
  Administered 2022-11-04: 4 mL

## 2022-11-04 NOTE — Progress Notes (Signed)
Chief Complaint: Left shoulder deltoid pain     History of Present Illness:    Preston Davis is a 51 y.o. male left-hand-dominant male presents with pain referring over the deltoid and shoulder now for the last several months.  He does have a history of stent placed with Dr. Lenell Antu in November of this year.  He states that that did improve his claudication type symptoms.  He has residual pain about the lateral deltoid which is difficult with overhead activity.  He has a hard time laying directly on the side.  Does have a history of right shoulder rotator cuff repaired in 2014 although this pain feels dissimilar to that.    Surgical History:   none  PMH/PSH/Family History/Social History/Meds/Allergies:    Past Medical History:  Diagnosis Date  . Acid reflux   . C. difficile colitis   . DVT (deep venous thrombosis) (HCC) 2014   left leg  . Factor 5 Leiden mutation, heterozygous (HCC)   . Heart murmur   . High blood cholesterol level   . Hypertension   . Right shoulder pain    Past Surgical History:  Procedure Laterality Date  . AORTIC ARCH ANGIOGRAPHY N/A 10/07/2022   Procedure: AORTIC ARCH ANGIOGRAPHY;  Surgeon: Leonie Douglas, MD;  Location: [facility];  Service: Cardiovascular;  Laterality: N/A;  . APPENDECTOMY    . bone graft  right great toe     for bunion  . PERIPHERAL VASCULAR INTERVENTION Left 10/07/2022   Procedure: PERIPHERAL VASCULAR INTERVENTION;  Surgeon: Leonie Douglas, MD;  Location: [facility];  Service: Cardiovascular;  Laterality: Left;  Subclavian  . SHOULDER OPEN ROTATOR CUFF REPAIR Right 05/14/2013   Procedure: ROTATOR CUFF REPAIR SHOULDER OPEN;  Surgeon: Darreld Mclean, MD;  Location: [facility];  Service: Orthopedics;  Laterality: Right;  Marland Kitchen VASECTOMY     Social History   Socioeconomic History  . Marital status: Married    Spouse name: Not on file  . Number of children: Not on file  . Years of  education: Not on file  . Highest education level: Not on file  Occupational History  . Not on file  Tobacco Use  . Smoking status: Former    Packs/day: 1.00    Types: Cigarettes    Quit date: 06/03/2013    Years since quitting: 9.4    Passive exposure: Never  . Smokeless tobacco: Never  Vaping Use  . Vaping Use: Never used  Substance and Sexual Activity  . Alcohol use: No  . Drug use: No  . Sexual activity: Yes    Birth control/protection: Other-see comments    Comment: had vasectomy  Other Topics Concern  . Not on file  Social History Narrative  . Not on file   Social Determinants of Health   Financial Resource Strain: Not on file  Food Insecurity: Not on file  Transportation Needs: Not on file  Physical Activity: Not on file  Stress: Not on file  Social Connections: Not on file   Family History  Problem Relation Age of Onset  . Clotting disorder Mother   . Diabetes Paternal Grandmother    Allergies  Allergen Reactions  . Benicar [Olmesartan] Other (See Comments)    REACTION: Unable to regulate blood pressure with this medication--caused BP levels to  drop too low.   Current Outpatient Medications  Medication Sig Dispense Refill  . ascorbic acid (VITAMIN C) 500 MG tablet Take 500 mg by mouth daily.    Marland Kitchen aspirin EC 81 MG tablet Take 81 mg by mouth daily. Swallow whole.    Marland Kitchen atenolol (TENORMIN) 25 MG tablet Take 25 mg by mouth daily.    Marland Kitchen atorvastatin (LIPITOR) 40 MG tablet Take 1 tablet (40 mg total) by mouth daily. 30 tablet 11  . clopidogrel (PLAVIX) 75 MG tablet Take 1 tablet (75 mg total) by mouth daily. 30 tablet 11  . ezetimibe (ZETIA) 10 MG tablet Take 10 mg by mouth daily.    Marland Kitchen HYDROcodone-acetaminophen (NORCO) 10-325 MG tablet Take 1 tablet by mouth every 6 (six) hours.    . sildenafil (VIAGRA) 100 MG tablet Take 50-100 mg by mouth daily as needed for erectile dysfunction.    Marland Kitchen zolpidem (AMBIEN) 10 MG tablet Take 10 mg by mouth at bedtime.     No  current facility-administered medications for this visit.   No results found.  Review of Systems:   A ROS was performed including pertinent positives and negatives as documented in the HPI.  Physical Exam :   Constitutional: NAD and appears stated age Neurological: Alert and oriented Psych: Appropriate affect and cooperative There were no vitals taken for this visit.   Comprehensive Musculoskeletal Exam:    Musculoskeletal Exam    Inspection Right Left  Skin No atrophy or winging No atrophy or winging  Palpation    Tenderness none Lateral deltoid  Range of Motion    Flexion (passive) 170 170  Flexion (active) 170 170  Abduction 170 170  ER at the side 70 70  Can reach behind back to T12 T12  Strength     Full Full with pain  Special Tests    Pseudoparalytic No No  Neurologic    Fires PIN, radial, median, ulnar, musculocutaneous, axillary, suprascapular, long thoracic, and spinal accessory innervated muscles. No abnormal sensibility  Vascular/Lymphatic    Radial Pulse 2+ 2+  Cervical Exam    Patient has symmetric cervical range of motion with negative Spurling's test.  Special Test: Positive Neer impingement     Imaging:   Xray (3 views left shoulder): Normal   I personally reviewed and interpreted the radiographs.   Assessment:   51 y.o. male with left shoulder pain in the setting of likely rotator cuff tendinitis.  Side effect I do believe that we can likely resolve all of his symptoms with an ultrasound-guided injection of the subacromial space as well as physical therapy to show him a good shoulder strengthening program.  I will plan on ordering physical therapy for him to begin immediately.  He has elected for a left shoulder injection today.  I will plan to see him back in 6 weeks if he does not have complete relief from both of these interventions.  Plan :    -Left shoulder ultrasound-guided injection performed verbal consent obtained    Procedure  Note  Patient: Preston Davis             Date of Birth: December 10, 1970           MRN: [id number]             Visit Date: 11/04/2022  Procedures: Visit Diagnoses:  1. Chronic left shoulder pain     Large Joint Inj: L subacromial bursa on 11/04/2022 10:20 AM Indications: pain Details: 22 G 1.5 in  needle, ultrasound-guided anterior approach  Arthrogram: No  Medications: 4 mL lidocaine 1 %; 80 mg triamcinolone acetonide 40 MG/ML Outcome: tolerated well, no immediate complications Procedure, treatment alternatives, risks and benefits explained, specific risks discussed. Consent was given by the patient. Immediately prior to procedure a time out was called to verify the correct patient, procedure, equipment, support staff and site/side marked as required. Patient was prepped and draped in the usual sterile fashion.          I personally saw and evaluated the patient, and participated in the management and treatment plan.  Huel Cote, MD Attending Physician, Orthopedic Surgery  This document was dictated using Dragon voice recognition software. A reasonable attempt at proof reading has been made to minimize errors.

## 2023-01-10 DIAGNOSIS — M1991 Primary osteoarthritis, unspecified site: Secondary | ICD-10-CM | POA: Diagnosis not present

## 2023-01-10 DIAGNOSIS — Z1389 Encounter for screening for other disorder: Secondary | ICD-10-CM | POA: Diagnosis not present

## 2023-01-10 DIAGNOSIS — Z1331 Encounter for screening for depression: Secondary | ICD-10-CM | POA: Diagnosis not present

## 2023-01-10 DIAGNOSIS — I739 Peripheral vascular disease, unspecified: Secondary | ICD-10-CM | POA: Diagnosis not present

## 2023-01-10 DIAGNOSIS — H6982 Other specified disorders of Eustachian tube, left ear: Secondary | ICD-10-CM | POA: Diagnosis not present

## 2023-01-10 DIAGNOSIS — Z9229 Personal history of other drug therapy: Secondary | ICD-10-CM | POA: Diagnosis not present

## 2023-01-10 DIAGNOSIS — E782 Mixed hyperlipidemia: Secondary | ICD-10-CM | POA: Diagnosis not present

## 2023-01-10 DIAGNOSIS — Z6828 Body mass index (BMI) 28.0-28.9, adult: Secondary | ICD-10-CM | POA: Diagnosis not present

## 2023-01-10 DIAGNOSIS — I10 Essential (primary) hypertension: Secondary | ICD-10-CM | POA: Diagnosis not present

## 2023-01-10 DIAGNOSIS — G894 Chronic pain syndrome: Secondary | ICD-10-CM | POA: Diagnosis not present

## 2023-01-10 DIAGNOSIS — Z0001 Encounter for general adult medical examination with abnormal findings: Secondary | ICD-10-CM | POA: Diagnosis not present

## 2023-02-20 ENCOUNTER — Telehealth: Payer: Self-pay

## 2023-02-20 DIAGNOSIS — I771 Stricture of artery: Secondary | ICD-10-CM

## 2023-02-20 NOTE — Telephone Encounter (Signed)
PT called c/o arm pain with concern of stent.  Reviewed pt's chart, returned call for clarification, two identifiers used. Pt stated that when he raises him L arm up, he feels a warm sensation and pain when he lowers it. This has worsened over the past month. He no longer has any tingling in his fingers. He has been to the orthopedist and received an injection for shoulder pain. He feels that this pain is the same as what he felt before the stent. Appts for [place] and Dr. Stanford Breed scheduled for first available. Confirmed understanding.

## 2023-03-06 NOTE — Progress Notes (Unsigned)
[facility]   ASSESSMENT / PLAN: Preston Davis is a 52 y.o. male status post left subclavian artery stenting 10/07/22 for disabling claudication. Good technical result achieved.  Recommend the following which can slow the progression of atherosclerosis and reduce the risk of major adverse cardiac / limb events:  Complete cessation from all tobacco products. Blood glucose control with goal A1c < 7%. Blood pressure control with goal blood pressure < 140/90 mmHg. Lipid reduction therapy with goal LDL-C <100 mg/dL (<40 if symptomatic from PAD).  Aspirin 81mg  PO QD.  Plavix 75mg  PO QD x 12 months total (stop date 10/08/23) Atorvastatin 40-80mg  PO QD (or other "high intensity" statin therapy).  Left arm pain much better. Palpable pulse in the wrist. Having some pain in the left shoulder with overhead movement. Will refer to Dr. .   CHIEF COMPLAINT: Left arm pain  HISTORY OF PRESENT ILLNESS: Preston Davis is a 52 y.o. male who presents to clinic for evaluation of left arm pain with overhead activity.  The patient has had an extensive work-up by his primary care physician, which has identified severe left subclavian artery stenosis.  The patient reports a fairly classic history of upper extremity claudication type symptoms.  He has easy fatigability, and pain with overhead and strenuous activity left upper extremity.  [name], but does write with his right hand.  We had a long discussion about the risk, benefits, and alternatives of an intervention.   09/06/22: Patient returns to clinic to clinic.  He would like to proceed with stenting as he does not feel his symptoms are getting any better.  11/01/22: Returns for follow-up evaluation after left subclavian artery stenting.  Left arm pain much improved.  He does have some shoulder discomfort with overhead activity and heavy lifting.  He has a history of rotator cuff issues on the  right side.  Past Medical History:  Diagnosis Date   Acid reflux    C. difficile colitis    DVT (deep venous thrombosis) (HCC) 2014   left leg   Factor 5 Leiden mutation, heterozygous (HCC)    Heart murmur    High blood cholesterol level    Hypertension    Right shoulder pain     Past Surgical History:  Procedure Laterality Date   AORTIC ARCH ANGIOGRAPHY N/A 10/07/2022   Procedure: AORTIC ARCH ANGIOGRAPHY;  Surgeon: 11/03/22, MD;  Location: [facility];  Service: Cardiovascular;  Laterality: N/A;   APPENDECTOMY     bone graft  right great toe     for bunion   PERIPHERAL VASCULAR INTERVENTION Left 10/07/2022   Procedure: PERIPHERAL VASCULAR INTERVENTION;  Surgeon: Leonie Douglas, MD;  Location: [facility];  Service: Cardiovascular;  Laterality: Left;  Subclavian   SHOULDER OPEN ROTATOR CUFF REPAIR Right 05/14/2013   Procedure: ROTATOR CUFF REPAIR SHOULDER OPEN;  Surgeon: Leonie Douglas, MD;  Location: [facility];  Service: Orthopedics;  Laterality: Right;   VASECTOMY      Family History  Problem Relation Age of Onset   Clotting disorder Mother    Diabetes Paternal Grandmother     Social History   Socioeconomic History   Marital status: Married    Spouse name: Not on file   Number of children: Not on file   Years of education: Not on file   Highest education level: Not on file  Occupational History   Not on file  Tobacco Use  Smoking status: Former    Packs/day: 1    Types: Cigarettes    Quit date: 06/03/2013    Years since quitting: 9.7    Passive exposure: Never   Smokeless tobacco: Never  Vaping Use   Vaping Use: Never used  Substance and Sexual Activity   Alcohol use: No   Drug use: No   Sexual activity: Yes    Birth control/protection: Other-see comments    Comment: had vasectomy  Other Topics Concern   Not on file  Social History Narrative   Not on file   Social Determinants of Health   Financial Resource Strain: Not on file   Food Insecurity: Not on file  Transportation Needs: Not on file  Physical Activity: Not on file  Stress: Not on file  Social Connections: Not on file  Intimate Partner Violence: Not on file    Allergies  Allergen Reactions   Benicar [Olmesartan] Other (See Comments)    REACTION: Unable to regulate blood pressure with this medication--caused BP levels to drop too low.    Current Outpatient Medications  Medication Sig Dispense Refill   ascorbic acid (VITAMIN C) 500 MG tablet Take 500 mg by mouth daily.     aspirin EC 81 MG tablet Take 81 mg by mouth daily. Swallow whole.     atenolol (TENORMIN) 25 MG tablet Take 25 mg by mouth daily.     atorvastatin (LIPITOR) 40 MG tablet Take 1 tablet (40 mg total) by mouth daily. 30 tablet 11   clopidogrel (PLAVIX) 75 MG tablet Take 1 tablet (75 mg total) by mouth daily. 30 tablet 11   ezetimibe (ZETIA) 10 MG tablet Take 10 mg by mouth daily.     HYDROcodone-acetaminophen (NORCO) 10-325 MG tablet Take 1 tablet by mouth every 6 (six) hours.     sildenafil (VIAGRA) 100 MG tablet Take 50-100 mg by mouth daily as needed for erectile dysfunction.     zolpidem (AMBIEN) 10 MG tablet Take 10 mg by mouth at bedtime.     No current facility-administered medications for this visit.    PHYSICAL EXAM There were no vitals filed for this visit.   Well-appearing man in no acute distress Regular rate and rhythm Unlabored breathing 2+ right radial pulse. Absent left brachial and radial pulse.  PERTINENT LABORATORY AND RADIOLOGIC DATA  Most recent CBC    Latest Ref Rng & Units 10/07/2022    5:58 AM 12/17/2017    6:12 AM 12/15/2017    1:12 PM  CBC  WBC 4.0 - 10.5 K/uL  6.8  9.9   Hemoglobin 13.0 - 17.0 g/dL 14.6  12.2  13.1   Hematocrit 39.0 - 52.0 % 43.0  39.0  41.7   Platelets 150 - 400 K/uL  207  218      Most recent CMP    Latest Ref Rng & Units 10/07/2022    5:58 AM 12/17/2017    6:12 AM 12/16/2017    6:19 AM  CMP  Glucose 70 - 99 mg/dL 121   112  150   BUN 6 - 20 mg/dL 12  10  9    Creatinine 0.61 - 1.24 mg/dL 0.90  0.87  0.83   Sodium 135 - 145 mmol/L 142  139  138   Potassium 3.5 - 5.1 mmol/L 3.6  3.7  3.7   Chloride 98 - 111 mmol/L 107  105  107   CO2 22 - 32 mmol/L  25  21   Calcium 8.9 -  10.3 mg/dL  8.4  8.5    Angiogram and intervention reviewed today.  Good technical result from left subclavian artery stenting.  Duplex ultrasound of the left upper extremity shows no flow-limiting stenosis in the left upper extremity.  Triphasic flow throughout the visualized arteries.  Yevonne Aline. Stanford Breed, MD FACS [facility] Phone Number: [phone] 03/06/2023 2:15 PM  Total time spent on preparing this encounter including chart review, data review, collecting history, examining the patient, coordinating care for this established patient, 20 minutes  Portions of this report may have been transcribed using voice recognition software.  Every effort has been made to ensure accuracy; however, inadvertent computerized transcription errors may still be present.

## 2023-03-07 ENCOUNTER — Ambulatory Visit (INDEPENDENT_AMBULATORY_CARE_PROVIDER_SITE_OTHER): Payer: BC Managed Care – PPO | Admitting: Vascular Surgery

## 2023-03-07 ENCOUNTER — Ambulatory Visit (HOSPITAL_COMMUNITY)
Admission: RE | Admit: 2023-03-07 | Discharge: 2023-03-07 | Disposition: A | Discharge status: Home / Self Care | Payer: BC Managed Care – PPO | Source: Ambulatory Visit | Attending: Vascular Surgery | Admitting: Vascular Surgery

## 2023-03-07 ENCOUNTER — Encounter: Payer: Self-pay | Admitting: Vascular Surgery

## 2023-03-07 VITALS — BP 105/71 | HR 52 | Temp 98.9°F | Resp 20 | Ht 68.0 in | Wt 176.0 lb

## 2023-03-07 DIAGNOSIS — I739 Peripheral vascular disease, unspecified: Secondary | ICD-10-CM

## 2023-03-07 DIAGNOSIS — I771 Stricture of artery: Secondary | ICD-10-CM | POA: Diagnosis not present

## 2023-03-22 ENCOUNTER — Ambulatory Visit (HOSPITAL_BASED_OUTPATIENT_CLINIC_OR_DEPARTMENT_OTHER): Payer: BC Managed Care – PPO | Admitting: Orthopaedic Surgery

## 2023-03-22 DIAGNOSIS — M25512 Pain in left shoulder: Secondary | ICD-10-CM

## 2023-03-22 DIAGNOSIS — G8929 Other chronic pain: Secondary | ICD-10-CM | POA: Diagnosis not present

## 2023-03-22 MED ORDER — LIDOCAINE HCL 1 % IJ SOLN
4.0000 mL | INTRAMUSCULAR | Status: AC | PRN
  Administered 2023-03-22: 4 mL

## 2023-03-22 MED ORDER — TRIAMCINOLONE ACETONIDE 40 MG/ML IJ SUSP
80.0000 mg | INTRAMUSCULAR | Status: AC | PRN
  Administered 2023-03-22: 80 mg via INTRA_ARTICULAR

## 2023-03-22 NOTE — Progress Notes (Signed)
Chief Complaint: Left shoulder deltoid pain     History of Present Illness:   03/22/2023: Presents today with persistent lateral based shoulder pain.  He states that he did get 2 months of extremely good relief although this is subsequently worn off.  He is hoping for additional injection today.  He is having persistent pain with overhead activity.  Preston Davis is a 52 y.o. male left-hand-dominant male presents with pain referring over the deltoid and shoulder now for the last several months.  He does have a history of stent placed with Dr. Lenell Antu in November of this year.  He states that that did improve his claudication type symptoms.  He has residual pain about the lateral deltoid which is difficult with overhead activity.  He has a hard time laying directly on the side.  Does have a history of right shoulder rotator cuff repaired in 2014 although this pain feels dissimilar to that.    Surgical History:   none  PMH/PSH/Family History/Social History/Meds/Allergies:    Past Medical History:  Diagnosis Date   Acid reflux    C. difficile colitis    DVT (deep venous thrombosis) 2014   left leg   Factor 5 Leiden mutation, heterozygous    Heart murmur    High blood cholesterol level    Hypertension    Right shoulder pain    Past Surgical History:  Procedure Laterality Date   AORTIC ARCH ANGIOGRAPHY N/A 10/07/2022   Procedure: AORTIC ARCH ANGIOGRAPHY;  Surgeon: Leonie Douglas, MD;  Location: [facility];  Service: Cardiovascular;  Laterality: N/A;   APPENDECTOMY     bone graft  right great toe     for bunion   PERIPHERAL VASCULAR INTERVENTION Left 10/07/2022   Procedure: PERIPHERAL VASCULAR INTERVENTION;  Surgeon: Leonie Douglas, MD;  Location: [facility];  Service: Cardiovascular;  Laterality: Left;  Subclavian   SHOULDER OPEN ROTATOR CUFF REPAIR Right 05/14/2013   Procedure: ROTATOR CUFF REPAIR SHOULDER OPEN;  Surgeon: Darreld Mclean, MD;  Location: [facility];  Service: Orthopedics;  Laterality: Right;   VASECTOMY     Social History   Socioeconomic History   Marital status: Married    Spouse name: Not on file   Number of children: Not on file   Years of education: Not on file   Highest education level: Not on file  Occupational History   Not on file  Tobacco Use   Smoking status: Former    Packs/day: 1    Types: Cigarettes    Quit date: 06/03/2013    Years since quitting: 9.8    Passive exposure: Never   Smokeless tobacco: Never  Vaping Use   Vaping Use: Never used  Substance and Sexual Activity   Alcohol use: No   Drug use: No   Sexual activity: Yes    Birth control/protection: Other-see comments    Comment: had vasectomy  Other Topics Concern   Not on file  Social History Narrative   Not on file   Social Determinants of Health   Financial Resource Strain: Not on file  Food Insecurity: Not on file  Transportation Needs: Not on file  Physical Activity: Not on file  Stress: Not on file  Social Connections: Not on file   Family History  Problem Relation  Age of Onset   Clotting disorder Mother    Diabetes Paternal Grandmother    Allergies  Allergen Reactions   Benicar [Olmesartan] Other (See Comments)    REACTION: Unable to regulate blood pressure with this medication--caused BP levels to drop too low.   Current Outpatient Medications  Medication Sig Dispense Refill   ascorbic acid (VITAMIN C) 500 MG tablet Take 500 mg by mouth daily.     aspirin EC 81 MG tablet Take 81 mg by mouth daily. Swallow whole.     atenolol (TENORMIN) 25 MG tablet Take 25 mg by mouth daily.     atorvastatin (LIPITOR) 40 MG tablet Take 1 tablet (40 mg total) by mouth daily. 30 tablet 11   clopidogrel (PLAVIX) 75 MG tablet Take 1 tablet (75 mg total) by mouth daily. 30 tablet 11   ezetimibe (ZETIA) 10 MG tablet Take 10 mg by mouth daily.     HYDROcodone-acetaminophen (NORCO) 10-325 MG tablet Take 1 tablet by  mouth every 6 (six) hours.     sildenafil (VIAGRA) 100 MG tablet Take 50-100 mg by mouth daily as needed for erectile dysfunction.     zolpidem (AMBIEN) 10 MG tablet Take 10 mg by mouth at bedtime.     No current facility-administered medications for this visit.   No results found.  Review of Systems:   A ROS was performed including pertinent positives and negatives as documented in the HPI.  Physical Exam :   Constitutional: NAD and appears stated age Neurological: Alert and oriented Psych: Appropriate affect and cooperative There were no vitals taken for this visit.   Comprehensive Musculoskeletal Exam:    Musculoskeletal Exam    Inspection Right Left  Skin No atrophy or winging No atrophy or winging  Palpation    Tenderness none Lateral deltoid  Range of Motion    Flexion (passive) 170 170  Flexion (active) 170 170  Abduction 170 170  ER at the side 70 70  Can reach behind back to T12 T12  Strength     Full Full with pain  Special Tests    Pseudoparalytic No No  Neurologic    Fires PIN, radial, median, ulnar, musculocutaneous, axillary, suprascapular, long thoracic, and spinal accessory innervated muscles. No abnormal sensibility  Vascular/Lymphatic    Radial Pulse 2+ 2+  Cervical Exam    Patient has symmetric cervical range of motion with negative Spurling's test.  Special Test: Positive Neer impingement     Imaging:   Xray (3 views left shoulder): Normal   I personally reviewed and interpreted the radiographs.   Assessment:   52 y.o. male with left shoulder pain in the setting of likely rotator cuff tendinitis.  Overall he did get extremely good relief from initial injection.  He is wishing to pursue another 1.  He has performed strengthening of left shoulder with limited relief.  At this time he is also wishing to pursue an MRI of the left shoulder to further assess that if there is underlying tearing.  I do believe this is reasonable.  Will plan to proceed  with this and I will see him back in 1 month for reassessment Plan :    -Left shoulder ultrasound-guided injection performed verbal consent obtained    Procedure Note  Patient: Preston Davis             Date of Birth: [birth year]           MRN: [id number]  Visit Date: 03/22/2023  Procedures: Visit Diagnoses:  1. Chronic left shoulder pain     Large Joint Inj: L subacromial bursa on 03/22/2023 3:26 PM Indications: pain Details: 22 G 1.5 in needle, ultrasound-guided anterior approach  Arthrogram: No  Medications: 4 mL lidocaine 1 %; 80 mg triamcinolone acetonide 40 MG/ML Outcome: tolerated well, no immediate complications Procedure, treatment alternatives, risks and benefits explained, specific risks discussed. Consent was given by the patient. Immediately prior to procedure a time out was called to verify the correct patient, procedure, equipment, support staff and site/side marked as required. Patient was prepped and draped in the usual sterile fashion.          I personally saw and evaluated the patient, and participated in the management and treatment plan.  Huel Cote, MD Attending Physician, Orthopedic Surgery  This document was dictated using Dragon voice recognition software. A reasonable attempt at proof reading has been made to minimize errors.

## 2023-03-28 DIAGNOSIS — Z6827 Body mass index (BMI) 27.0-27.9, adult: Secondary | ICD-10-CM | POA: Diagnosis not present

## 2023-03-28 DIAGNOSIS — G894 Chronic pain syndrome: Secondary | ICD-10-CM | POA: Diagnosis not present

## 2023-03-28 DIAGNOSIS — I10 Essential (primary) hypertension: Secondary | ICD-10-CM | POA: Diagnosis not present

## 2023-03-28 DIAGNOSIS — I739 Peripheral vascular disease, unspecified: Secondary | ICD-10-CM | POA: Diagnosis not present

## 2023-03-28 DIAGNOSIS — E663 Overweight: Secondary | ICD-10-CM | POA: Diagnosis not present

## 2023-03-28 DIAGNOSIS — M1991 Primary osteoarthritis, unspecified site: Secondary | ICD-10-CM | POA: Diagnosis not present

## 2023-04-07 ENCOUNTER — Ambulatory Visit (HOSPITAL_BASED_OUTPATIENT_CLINIC_OR_DEPARTMENT_OTHER): Payer: BC Managed Care – PPO | Admitting: Orthopaedic Surgery

## 2023-04-10 ENCOUNTER — Encounter (HOSPITAL_BASED_OUTPATIENT_CLINIC_OR_DEPARTMENT_OTHER): Payer: Self-pay | Admitting: Orthopaedic Surgery

## 2023-04-19 ENCOUNTER — Ambulatory Visit (HOSPITAL_BASED_OUTPATIENT_CLINIC_OR_DEPARTMENT_OTHER): Payer: BC Managed Care – PPO | Admitting: Orthopaedic Surgery

## 2023-04-26 ENCOUNTER — Encounter (HOSPITAL_BASED_OUTPATIENT_CLINIC_OR_DEPARTMENT_OTHER): Payer: Self-pay | Admitting: Orthopaedic Surgery

## 2023-04-30 ENCOUNTER — Other Ambulatory Visit: Payer: BC Managed Care – PPO

## 2023-05-31 DIAGNOSIS — Z6826 Body mass index (BMI) 26.0-26.9, adult: Secondary | ICD-10-CM | POA: Diagnosis not present

## 2023-05-31 DIAGNOSIS — F411 Generalized anxiety disorder: Secondary | ICD-10-CM | POA: Diagnosis not present

## 2023-05-31 DIAGNOSIS — I10 Essential (primary) hypertension: Secondary | ICD-10-CM | POA: Diagnosis not present

## 2023-05-31 DIAGNOSIS — I739 Peripheral vascular disease, unspecified: Secondary | ICD-10-CM | POA: Diagnosis not present

## 2023-05-31 DIAGNOSIS — G894 Chronic pain syndrome: Secondary | ICD-10-CM | POA: Diagnosis not present

## 2023-05-31 DIAGNOSIS — M1991 Primary osteoarthritis, unspecified site: Secondary | ICD-10-CM | POA: Diagnosis not present

## 2023-07-14 ENCOUNTER — Ambulatory Visit (HOSPITAL_BASED_OUTPATIENT_CLINIC_OR_DEPARTMENT_OTHER): Payer: BC Managed Care – PPO | Admitting: Orthopaedic Surgery

## 2023-07-14 DIAGNOSIS — M25512 Pain in left shoulder: Secondary | ICD-10-CM

## 2023-07-14 DIAGNOSIS — G8929 Other chronic pain: Secondary | ICD-10-CM | POA: Diagnosis not present

## 2023-07-14 MED ORDER — TRIAMCINOLONE ACETONIDE 40 MG/ML IJ SUSP
80.0000 mg | INTRAMUSCULAR | Status: AC | PRN
  Administered 2023-07-14: 80 mg via INTRA_ARTICULAR

## 2023-07-14 MED ORDER — LIDOCAINE HCL 1 % IJ SOLN
4.0000 mL | INTRAMUSCULAR | Status: AC | PRN
  Administered 2023-07-14: 4 mL

## 2023-07-14 NOTE — Progress Notes (Signed)
Chief Complaint: Left shoulder deltoid pain     History of Present Illness:   07/14/2023: Presents today for follow-up of his left shoulder.  Unfortunately was not able to get his MRI as there was an issue with insurance.  He is still having persistent radiating pain down the left arm.  This has been trouble some at night and he is not able to lay on the side.  Preston Davis is a 52 y.o. male left-hand-dominant male presents with pain referring over the deltoid and shoulder now for the last several months.  He does have a history of stent placed with Dr. Lenell Antu in November of this year.  He states that that did improve his claudication type symptoms.  He has residual pain about the lateral deltoid which is difficult with overhead activity.  He has a hard time laying directly on the side.  Does have a history of right shoulder rotator cuff repaired in 2014 although this pain feels dissimilar to that.    Surgical History:   none  PMH/PSH/Family History/Social History/Meds/Allergies:    Past Medical History:  Diagnosis Date   Acid reflux    C. difficile colitis    DVT (deep venous thrombosis) (HCC) 2014   left leg   Factor 5 Leiden mutation, heterozygous (HCC)    Heart murmur    High blood cholesterol level    Hypertension    Right shoulder pain    Past Surgical History:  Procedure Laterality Date   AORTIC ARCH ANGIOGRAPHY N/A 10/07/2022   Procedure: AORTIC ARCH ANGIOGRAPHY;  Surgeon: Leonie Douglas, MD;  Location: [facility];  Service: Cardiovascular;  Laterality: N/A;   APPENDECTOMY     bone graft  right great toe     for bunion   PERIPHERAL VASCULAR INTERVENTION Left 10/07/2022   Procedure: PERIPHERAL VASCULAR INTERVENTION;  Surgeon: Leonie Douglas, MD;  Location: [facility];  Service: Cardiovascular;  Laterality: Left;  Subclavian   SHOULDER OPEN ROTATOR CUFF REPAIR Right 05/14/2013   Procedure: ROTATOR CUFF REPAIR SHOULDER  OPEN;  Surgeon: Darreld Mclean, MD;  Location: [facility];  Service: Orthopedics;  Laterality: Right;   VASECTOMY     Social History   Socioeconomic History   Marital status: Married    Spouse name: Not on file   Number of children: Not on file   Years of education: Not on file   Highest education level: Not on file  Occupational History   Not on file  Tobacco Use   Smoking status: Former    Current packs/day: 0.00    Types: Cigarettes    Quit date: 06/03/2013    Years since quitting: 10.1    Passive exposure: Never   Smokeless tobacco: Never  Vaping Use   Vaping status: Never Used  Substance and Sexual Activity   Alcohol use: No   Drug use: No   Sexual activity: Yes    Birth control/protection: Other-see comments    Comment: had vasectomy  Other Topics Concern   Not on file  Social History Narrative   Not on file   Social Determinants of Health   Financial Resource Strain: Not on file  Food Insecurity: Not on file  Transportation Needs: Not on file  Physical Activity: Not on file  Stress: Not on file  Social Connections: Not on file   Family History  Problem Relation Age of Onset   Clotting disorder Mother    Diabetes Paternal Grandmother    Allergies  Allergen Reactions   Benicar [Olmesartan] Other (See Comments)    REACTION: Unable to regulate blood pressure with this medication--caused BP levels to drop too low.   Current Outpatient Medications  Medication Sig Dispense Refill   ascorbic acid (VITAMIN C) 500 MG tablet Take 500 mg by mouth daily.     aspirin EC 81 MG tablet Take 81 mg by mouth daily. Swallow whole.     atenolol (TENORMIN) 25 MG tablet Take 25 mg by mouth daily.     atorvastatin (LIPITOR) 40 MG tablet Take 1 tablet (40 mg total) by mouth daily. 30 tablet 11   clopidogrel (PLAVIX) 75 MG tablet Take 1 tablet (75 mg total) by mouth daily. 30 tablet 11   ezetimibe (ZETIA) 10 MG tablet Take 10 mg by mouth daily.     HYDROcodone-acetaminophen (NORCO)  10-325 MG tablet Take 1 tablet by mouth every 6 (six) hours.     sildenafil (VIAGRA) 100 MG tablet Take 50-100 mg by mouth daily as needed for erectile dysfunction.     zolpidem (AMBIEN) 10 MG tablet Take 10 mg by mouth at bedtime.     No current facility-administered medications for this visit.   No results found.  Review of Systems:   A ROS was performed including pertinent positives and negatives as documented in the HPI.  Physical Exam :   Constitutional: NAD and appears stated age Neurological: Alert and oriented Psych: Appropriate affect and cooperative There were no vitals taken for this visit.   Comprehensive Musculoskeletal Exam:    Musculoskeletal Exam    Inspection Right Left  Skin No atrophy or winging No atrophy or winging  Palpation    Tenderness none Lateral deltoid  Range of Motion    Flexion (passive) 170 170  Flexion (active) 170 170  Abduction 170 170  ER at the side 70 70  Can reach behind back to T12 T12  Strength     Full Full with pain  Special Tests    Pseudoparalytic No No  Neurologic    Fires PIN, radial, median, ulnar, musculocutaneous, axillary, suprascapular, long thoracic, and spinal accessory innervated muscles. No abnormal sensibility  Vascular/Lymphatic    Radial Pulse 2+ 2+  Cervical Exam    Patient has symmetric cervical range of motion with negative Spurling's test.  Special Test: Positive Neer impingement     Imaging:   Xray (3 views left shoulder): Normal   I personally reviewed and interpreted the radiographs.   Assessment:   52 y.o. male with left shoulder pain in the setting of likely rotator cuff tendinitis.  Overall he did get extremely good relief from initial injection.  He is wishing to pursue another injection in the meantime.  At this time he has trialed strengthening program at home without any relief.  We will plan for an MRI of the left shoulder and follow-up to discuss results  Plan :    -Left shoulder  ultrasound-guided injection performed verbal consent obtained    Procedure Note  Patient: Preston Davis             Date of Birth: 04-05-1971           MRN: [id number]             Visit Date: 07/14/2023  Procedures: Visit Diagnoses:  No  diagnosis found.   Large Joint Inj on 07/14/2023 8:24 AM Indications: pain Details: 22 G 1.5 in needle, ultrasound-guided anterior approach  Arthrogram: No  Medications: 4 mL lidocaine 1 %; 80 mg triamcinolone acetonide 40 MG/ML Outcome: tolerated well, no immediate complications Procedure, treatment alternatives, risks and benefits explained, specific risks discussed. Consent was given by the patient. Immediately prior to procedure a time out was called to verify the correct patient, procedure, equipment, support staff and site/side marked as required. Patient was prepped and draped in the usual sterile fashion.           I personally saw and evaluated the patient, and participated in the management and treatment plan.  Huel Cote, MD Attending Physician, Orthopedic Surgery  This document was dictated using Dragon voice recognition software. A reasonable attempt at proof reading has been made to minimize errors.

## 2023-08-01 DIAGNOSIS — G4709 Other insomnia: Secondary | ICD-10-CM | POA: Diagnosis not present

## 2023-08-01 DIAGNOSIS — G894 Chronic pain syndrome: Secondary | ICD-10-CM | POA: Diagnosis not present

## 2023-08-01 DIAGNOSIS — M1991 Primary osteoarthritis, unspecified site: Secondary | ICD-10-CM | POA: Diagnosis not present

## 2023-08-01 DIAGNOSIS — I10 Essential (primary) hypertension: Secondary | ICD-10-CM | POA: Diagnosis not present

## 2023-08-01 DIAGNOSIS — M47816 Spondylosis without myelopathy or radiculopathy, lumbar region: Secondary | ICD-10-CM | POA: Diagnosis not present

## 2023-08-01 DIAGNOSIS — I739 Peripheral vascular disease, unspecified: Secondary | ICD-10-CM | POA: Diagnosis not present

## 2023-08-01 DIAGNOSIS — Z6826 Body mass index (BMI) 26.0-26.9, adult: Secondary | ICD-10-CM | POA: Diagnosis not present

## 2023-08-01 DIAGNOSIS — E663 Overweight: Secondary | ICD-10-CM | POA: Diagnosis not present

## 2023-08-10 ENCOUNTER — Ambulatory Visit
Admission: RE | Admit: 2023-08-10 | Discharge: 2023-08-10 | Disposition: A | Discharge status: Home / Self Care | Payer: BC Managed Care – PPO | Source: Ambulatory Visit | Attending: Orthopaedic Surgery | Admitting: Orthopaedic Surgery

## 2023-08-10 DIAGNOSIS — M7582 Other shoulder lesions, left shoulder: Secondary | ICD-10-CM | POA: Diagnosis not present

## 2023-08-10 DIAGNOSIS — M25512 Pain in left shoulder: Secondary | ICD-10-CM | POA: Diagnosis not present

## 2023-08-10 DIAGNOSIS — G8929 Other chronic pain: Secondary | ICD-10-CM

## 2023-08-25 ENCOUNTER — Other Ambulatory Visit (HOSPITAL_BASED_OUTPATIENT_CLINIC_OR_DEPARTMENT_OTHER): Payer: Self-pay

## 2023-08-25 ENCOUNTER — Ambulatory Visit (HOSPITAL_BASED_OUTPATIENT_CLINIC_OR_DEPARTMENT_OTHER): Payer: Self-pay | Admitting: Orthopaedic Surgery

## 2023-08-25 ENCOUNTER — Ambulatory Visit (HOSPITAL_BASED_OUTPATIENT_CLINIC_OR_DEPARTMENT_OTHER): Payer: BC Managed Care – PPO | Admitting: Orthopaedic Surgery

## 2023-08-25 DIAGNOSIS — M75122 Complete rotator cuff tear or rupture of left shoulder, not specified as traumatic: Secondary | ICD-10-CM | POA: Diagnosis not present

## 2023-08-25 MED ORDER — ACETAMINOPHEN 500 MG PO TABS
500.0000 mg | ORAL_TABLET | Freq: Three times a day (TID) | ORAL | 0 refills | Status: AC
  Filled 2023-08-25: qty 30, 10d supply, fill #0

## 2023-08-25 MED ORDER — IBUPROFEN 800 MG PO TABS
800.0000 mg | ORAL_TABLET | Freq: Three times a day (TID) | ORAL | 0 refills | Status: AC
  Filled 2023-08-25: qty 30, 10d supply, fill #0

## 2023-08-25 MED ORDER — OXYCODONE HCL 5 MG PO TABS
5.0000 mg | ORAL_TABLET | ORAL | 0 refills | Status: DC | PRN
  Filled 2023-08-25: qty 15, 3d supply, fill #0

## 2023-08-25 NOTE — Progress Notes (Signed)
Chief Complaint: Left shoulder deltoid pain     History of Present Illness:   08/25/2023: Presents today for follow-up of his left shoulder.  Overall he is continuing to have pain with any type of overhead activity.  He is experiencing the pain predominantly about the lateral aspect of the deltoid although there is some tenderness about the biceps as well  Preston Davis is a 52 y.o. male left-hand-dominant male presents with pain referring over the deltoid and shoulder now for the last several months.  He does have a history of stent placed with Dr. Lenell Antu in November of this year.  He states that that did improve his claudication type symptoms.  He has residual pain about the lateral deltoid which is difficult with overhead activity.  He has a hard time laying directly on the side.  Does have a history of right shoulder rotator cuff repaired in 2014 although this pain feels dissimilar to that.    Surgical History:   none  PMH/PSH/Family History/Social History/Meds/Allergies:    Past Medical History:  Diagnosis Date   Acid reflux    C. difficile colitis    DVT (deep venous thrombosis) (HCC) 2014   left leg   Factor 5 Leiden mutation, heterozygous (HCC)    Heart murmur    High blood cholesterol level    Hypertension    Right shoulder pain    Past Surgical History:  Procedure Laterality Date   AORTIC ARCH ANGIOGRAPHY N/A 10/07/2022   Procedure: AORTIC ARCH ANGIOGRAPHY;  Surgeon: Leonie Douglas, MD;  Location: [facility];  Service: Cardiovascular;  Laterality: N/A;   APPENDECTOMY     bone graft  right great toe     for bunion   PERIPHERAL VASCULAR INTERVENTION Left 10/07/2022   Procedure: PERIPHERAL VASCULAR INTERVENTION;  Surgeon: Leonie Douglas, MD;  Location: [facility];  Service: Cardiovascular;  Laterality: Left;  Subclavian   SHOULDER OPEN ROTATOR CUFF REPAIR Right 05/14/2013   Procedure: ROTATOR CUFF REPAIR SHOULDER OPEN;   Surgeon: Darreld Mclean, MD;  Location: [facility];  Service: Orthopedics;  Laterality: Right;   VASECTOMY     Social History   Socioeconomic History   Marital status: Married    Spouse name: Not on file   Number of children: Not on file   Years of education: Not on file   Highest education level: Not on file  Occupational History   Not on file  Tobacco Use   Smoking status: Former    Current packs/day: 0.00    Types: Cigarettes    Quit date: 06/03/2013    Years since quitting: 10.2    Passive exposure: Never   Smokeless tobacco: Never  Vaping Use   Vaping status: Never Used  Substance and Sexual Activity   Alcohol use: No   Drug use: No   Sexual activity: Yes    Birth control/protection: Other-see comments    Comment: had vasectomy  Other Topics Concern   Not on file  Social History Narrative   Not on file   Social Determinants of Health   Financial Resource Strain: Not on file  Food Insecurity: Not on file  Transportation Needs: Not on file  Physical Activity: Not on file  Stress: Not on file  Social Connections: Not on file  Family History  Problem Relation Age of Onset   Clotting disorder Mother    Diabetes Paternal Grandmother    Allergies  Allergen Reactions   Benicar [Olmesartan] Other (See Comments)    REACTION: Unable to regulate blood pressure with this medication--caused BP levels to drop too low.   Current Outpatient Medications  Medication Sig Dispense Refill   acetaminophen (TYLENOL) 500 MG tablet Take 1 tablet (500 mg total) by mouth every 8 (eight) hours for 10 days. 30 tablet 0   ibuprofen (ADVIL) 800 MG tablet Take 1 tablet (800 mg total) by mouth every 8 (eight) hours for 10 days. Please take with food, please alternate with acetaminophen 30 tablet 0   oxyCODONE (ROXICODONE) 5 MG immediate release tablet Take 1 tablet (5 mg total) by mouth every 4 (four) hours as needed for severe pain or breakthrough pain. 15 tablet 0   ascorbic acid (VITAMIN  C) 500 MG tablet Take 500 mg by mouth daily.     aspirin EC 81 MG tablet Take 81 mg by mouth daily. Swallow whole.     atenolol (TENORMIN) 25 MG tablet Take 25 mg by mouth daily.     atorvastatin (LIPITOR) 40 MG tablet Take 1 tablet (40 mg total) by mouth daily. 30 tablet 11   clopidogrel (PLAVIX) 75 MG tablet Take 1 tablet (75 mg total) by mouth daily. 30 tablet 11   ezetimibe (ZETIA) 10 MG tablet Take 10 mg by mouth daily.     HYDROcodone-acetaminophen (NORCO) 10-325 MG tablet Take 1 tablet by mouth every 6 (six) hours.     sildenafil (VIAGRA) 100 MG tablet Take 50-100 mg by mouth daily as needed for erectile dysfunction.     zolpidem (AMBIEN) 10 MG tablet Take 10 mg by mouth at bedtime.     No current facility-administered medications for this visit.   No results found.  Review of Systems:   A ROS was performed including pertinent positives and negatives as documented in the HPI.  Physical Exam :   Constitutional: NAD and appears stated age Neurological: Alert and oriented Psych: Appropriate affect and cooperative There were no vitals taken for this visit.   Comprehensive Musculoskeletal Exam:    Musculoskeletal Exam    Inspection Right Left  Skin No atrophy or winging No atrophy or winging  Palpation    Tenderness none Lateral deltoid  Range of Motion    Flexion (passive) 170 170  Flexion (active) 170 170  Abduction 170 170  ER at the side 70 70  Can reach behind back to T12 T12  Strength     Full Full with pain  Special Tests    Pseudoparalytic No No  Neurologic    Fires PIN, radial, median, ulnar, musculocutaneous, axillary, suprascapular, long thoracic, and spinal accessory innervated muscles. No abnormal sensibility  Vascular/Lymphatic    Radial Pulse 2+ 2+  Cervical Exam    Patient has symmetric cervical range of motion with negative Spurling's test.  Special Test: Positive Neer impingement     Imaging:   Xray (3 views left shoulder): Normal  MRI left  shoulder: There is a articular sided tear of the rotator cuff of the supraspinatus which is approximately 50%, intrasubstance biceps tendon tearing  I personally reviewed and interpreted the radiographs.   Assessment:   52 y.o. male with left shoulder pain in the setting of a rotator cuff tear.  At this time he has trialed an injection as well as his home strengthening exercise program which  she had used for his previous right shoulder.  None of this is improving his symptoms.  Given this we did discuss the possibility of arthroscopic intervention.  I did discuss possible left shoulder arthroscopy with acromioplasty, rotator cuff repair versus collagen patch augmentation, biceps tenodesis.  We did discuss the rehab associated with this.  I did discuss the possibility of 6 weeks of nonweightbearing should he require a rotator cuff repair.  After discussion he is elected for surgical repair  Plan :    -Plan for left shoulder arthroscopy with acromioplasty, rotator cuff repair versus collagen patch augmentation, possible biceps tenodesis   After a lengthy discussion of treatment options, including risks, benefits, alternatives, complications of surgical and nonsurgical conservative options, the patient elected surgical repair.   The patient  is aware of the material risks  and complications including, but not limited to injury to adjacent structures, neurovascular injury, infection, numbness, bleeding, implant failure, thermal burns, stiffness, persistent pain, failure to heal, disease transmission from allograft, need for further surgery, dislocation, anesthetic risks, blood clots, risks of death,and others. The probabilities of surgical success and failure discussed with patient given their particular co-morbidities.The time and nature of expected rehabilitation and recovery was discussed.The patient's questions were all answered preoperatively.  No barriers to understanding were noted. I explained  the natural history of the disease process and Rx rationale.  I explained to the patient what I considered to be reasonable expectations given their personal situation.  The final treatment plan was arrived at through a shared patient decision making process model.        I personally saw and evaluated the patient, and participated in the management and treatment plan.  Huel Cote, MD Attending Physician, Orthopedic Surgery  This document was dictated using Dragon voice recognition software. A reasonable attempt at proof reading has been made to minimize errors.

## 2023-08-28 ENCOUNTER — Encounter (HOSPITAL_BASED_OUTPATIENT_CLINIC_OR_DEPARTMENT_OTHER): Payer: Self-pay | Admitting: Orthopaedic Surgery

## 2023-08-30 NOTE — Telephone Encounter (Signed)
I spoke with the patient, and scheduled him for surgery on 10/24/23.

## 2023-09-05 NOTE — Progress Notes (Signed)
Fax received from [facility] on 08/30/23 for medical clearance/medication hold for left shoulder arthroscopy with acromioplasty, possible rotator cuff repair and collagen patch augmentation to be signed by T. Lenell Antu, MD.  Provider signed on 09/04/23, scanned into pt's chart, media routed via MyChart to sender on 09/05/23.

## 2023-09-25 ENCOUNTER — Ambulatory Visit (HOSPITAL_BASED_OUTPATIENT_CLINIC_OR_DEPARTMENT_OTHER): Payer: BC Managed Care – PPO | Admitting: Orthopaedic Surgery

## 2023-09-25 DIAGNOSIS — M75122 Complete rotator cuff tear or rupture of left shoulder, not specified as traumatic: Secondary | ICD-10-CM

## 2023-09-25 DIAGNOSIS — M25512 Pain in left shoulder: Secondary | ICD-10-CM

## 2023-09-25 MED ORDER — TRIAMCINOLONE ACETONIDE 40 MG/ML IJ SUSP
80.0000 mg | INTRAMUSCULAR | Status: AC | PRN
Start: 2023-09-25 — End: 2023-09-25
  Administered 2023-09-25: 80 mg via INTRA_ARTICULAR

## 2023-09-25 MED ORDER — LIDOCAINE HCL 1 % IJ SOLN
4.0000 mL | INTRAMUSCULAR | Status: AC | PRN
Start: 2023-09-25 — End: 2023-09-25
  Administered 2023-09-25: 4 mL

## 2023-09-25 NOTE — Progress Notes (Signed)
Chief Complaint: Left shoulder deltoid pain     History of Present Illness:   09/25/2023: Presents today for follow-up of his left shoulder.  He is hoping for an additional ultrasound-guided injection as he is having significant pain after helping to move his mother cross-country.  Stephon J Poorman is a 52 y.o. male left-hand-dominant male presents with pain referring over the deltoid and shoulder now for the last several months.  He does have a history of stent placed with Dr. Lenell Antu in November of this year.  He states that that did improve his claudication type symptoms.  He has residual pain about the lateral deltoid which is difficult with overhead activity.  He has a hard time laying directly on the side.  Does have a history of right shoulder rotator cuff repaired in 2014 although this pain feels dissimilar to that.    Surgical History:   none  PMH/PSH/Family History/Social History/Meds/Allergies:    Past Medical History:  Diagnosis Date   Acid reflux    C. difficile colitis    DVT (deep venous thrombosis) (HCC) 2014   left leg   Factor 5 Leiden mutation, heterozygous (HCC)    Heart murmur    High blood cholesterol level    Hypertension    Right shoulder pain    Past Surgical History:  Procedure Laterality Date   AORTIC ARCH ANGIOGRAPHY N/A 10/07/2022   Procedure: AORTIC ARCH ANGIOGRAPHY;  Surgeon: Leonie Douglas, MD;  Location: [facility];  Service: Cardiovascular;  Laterality: N/A;   APPENDECTOMY     bone graft  right great toe     for bunion   PERIPHERAL VASCULAR INTERVENTION Left 10/07/2022   Procedure: PERIPHERAL VASCULAR INTERVENTION;  Surgeon: Leonie Douglas, MD;  Location: [facility];  Service: Cardiovascular;  Laterality: Left;  Subclavian   SHOULDER OPEN ROTATOR CUFF REPAIR Right 05/14/2013   Procedure: ROTATOR CUFF REPAIR SHOULDER OPEN;  Surgeon: Darreld Mclean, MD;  Location: [facility];  Service: Orthopedics;   Laterality: Right;   VASECTOMY     Social History   Socioeconomic History   Marital status: Married    Spouse name: Not on file   Number of children: Not on file   Years of education: Not on file   Highest education level: Not on file  Occupational History   Not on file  Tobacco Use   Smoking status: Former    Current packs/day: 0.00    Types: Cigarettes    Quit date: 06/03/2013    Years since quitting: 10.3    Passive exposure: Never   Smokeless tobacco: Never  Vaping Use   Vaping status: Never Used  Substance and Sexual Activity   Alcohol use: No   Drug use: No   Sexual activity: Yes    Birth control/protection: Other-see comments    Comment: had vasectomy  Other Topics Concern   Not on file  Social History Narrative   Not on file   Social Determinants of Health   Financial Resource Strain: Not on file  Food Insecurity: Not on file  Transportation Needs: Not on file  Physical Activity: Not on file  Stress: Not on file  Social Connections: Not on file   Family History  Problem Relation Age of Onset   Clotting disorder Mother  Diabetes Paternal Grandmother    Allergies  Allergen Reactions   Benicar [Olmesartan] Other (See Comments)    REACTION: Unable to regulate blood pressure with this medication--caused BP levels to drop too low.   Current Outpatient Medications  Medication Sig Dispense Refill   ascorbic acid (VITAMIN C) 500 MG tablet Take 500 mg by mouth daily.     aspirin EC 81 MG tablet Take 81 mg by mouth daily. Swallow whole.     atenolol (TENORMIN) 25 MG tablet Take 25 mg by mouth daily.     atorvastatin (LIPITOR) 40 MG tablet Take 1 tablet (40 mg total) by mouth daily. 30 tablet 11   clopidogrel (PLAVIX) 75 MG tablet Take 1 tablet (75 mg total) by mouth daily. 30 tablet 11   ezetimibe (ZETIA) 10 MG tablet Take 10 mg by mouth daily.     HYDROcodone-acetaminophen (NORCO) 10-325 MG tablet Take 1 tablet by mouth every 6 (six) hours.     oxyCODONE  (ROXICODONE) 5 MG immediate release tablet Take 1 tablet (5 mg total) by mouth every 4 (four) hours as needed for severe pain or breakthrough pain. 15 tablet 0   sildenafil (VIAGRA) 100 MG tablet Take 50-100 mg by mouth daily as needed for erectile dysfunction.     zolpidem (AMBIEN) 10 MG tablet Take 10 mg by mouth at bedtime.     No current facility-administered medications for this visit.   No results found.  Review of Systems:   A ROS was performed including pertinent positives and negatives as documented in the HPI.  Physical Exam :   Constitutional: NAD and appears stated age Neurological: Alert and oriented Psych: Appropriate affect and cooperative There were no vitals taken for this visit.   Comprehensive Musculoskeletal Exam:    Musculoskeletal Exam    Inspection Right Left  Skin No atrophy or winging No atrophy or winging  Palpation    Tenderness none Lateral deltoid  Range of Motion    Flexion (passive) 170 170  Flexion (active) 170 170  Abduction 170 170  ER at the side 70 70  Can reach behind back to T12 T12  Strength     Full Full with pain  Special Tests    Pseudoparalytic No No  Neurologic    Fires PIN, radial, median, ulnar, musculocutaneous, axillary, suprascapular, long thoracic, and spinal accessory innervated muscles. No abnormal sensibility  Vascular/Lymphatic    Radial Pulse 2+ 2+  Cervical Exam    Patient has symmetric cervical range of motion with negative Spurling's test.  Special Test: Positive Neer impingement     Imaging:   Xray (3 views left shoulder): Normal  MRI left shoulder: There is a articular sided tear of the rotator cuff of the supraspinatus which is approximately 50%, intrasubstance biceps tendon tearing  I personally reviewed and interpreted the radiographs.   Assessment:   52 y.o. male with left shoulder pain in the setting of a rotator cuff tear.  At this time he has trialed an injection as well as his home strengthening  exercise program which she had used for his previous right shoulder.  None of this is improving his symptoms.  Given this we did discuss the possibility of arthroscopic intervention.  I did discuss possible left shoulder arthroscopy with acromioplasty, rotator cuff repair versus collagen patch augmentation, biceps tenodesis.  We did discuss the rehab associated with this.  I did discuss the possibility of 6 weeks of nonweightbearing should he require a rotator cuff repair.  After discussion he is elected for surgical repair  With today's visit he is requesting 1 additional ultrasound-guided injection which she has performed at least 1 month prior to surgery  Plan :    -Plan for left shoulder arthroscopy with acromioplasty, rotator cuff repair versus collagen patch augmentation, possible biceps tenodesis      Procedure Note  Patient: Winthrop Sharen Heck             Date of Birth: 1971/06/08           MRN: [id number]             Visit Date: 09/25/2023  Procedures: Visit Diagnoses: No diagnosis found.  Large Joint Inj: L subacromial bursa on 09/25/2023 11:43 AM Indications: pain Details: 22 G 1.5 in needle, ultrasound-guided anterior approach  Arthrogram: No  Medications: 4 mL lidocaine 1 %; 80 mg triamcinolone acetonide 40 MG/ML Outcome: tolerated well, no immediate complications Procedure, treatment alternatives, risks and benefits explained, specific risks discussed. Consent was given by the patient. Immediately prior to procedure a time out was called to verify the correct patient, procedure, equipment, support staff and site/side marked as required. Patient was prepped and draped in the usual sterile fashion.         After a lengthy discussion of treatment options, including risks, benefits, alternatives, complications of surgical and nonsurgical conservative options, the patient elected surgical repair.   The patient  is aware of the material risks  and complications including, but  not limited to injury to adjacent structures, neurovascular injury, infection, numbness, bleeding, implant failure, thermal burns, stiffness, persistent pain, failure to heal, disease transmission from allograft, need for further surgery, dislocation, anesthetic risks, blood clots, risks of death,and others. The probabilities of surgical success and failure discussed with patient given their particular co-morbidities.The time and nature of expected rehabilitation and recovery was discussed.The patient's questions were all answered preoperatively.  No barriers to understanding were noted. I explained the natural history of the disease process and Rx rationale.  I explained to the patient what I considered to be reasonable expectations given their personal situation.  The final treatment plan was arrived at through a shared patient decision making process model.        I personally saw and evaluated the patient, and participated in the management and treatment plan.  Huel Cote, MD Attending Physician, Orthopedic Surgery  This document was dictated using Dragon voice recognition software. A reasonable attempt at proof reading has been made to minimize errors.

## 2023-09-28 ENCOUNTER — Encounter (HOSPITAL_BASED_OUTPATIENT_CLINIC_OR_DEPARTMENT_OTHER): Payer: Self-pay | Admitting: Orthopaedic Surgery

## 2023-09-29 DIAGNOSIS — M47816 Spondylosis without myelopathy or radiculopathy, lumbar region: Secondary | ICD-10-CM | POA: Diagnosis not present

## 2023-09-29 DIAGNOSIS — G894 Chronic pain syndrome: Secondary | ICD-10-CM | POA: Diagnosis not present

## 2023-09-29 DIAGNOSIS — Z6826 Body mass index (BMI) 26.0-26.9, adult: Secondary | ICD-10-CM | POA: Diagnosis not present

## 2023-09-29 DIAGNOSIS — M1991 Primary osteoarthritis, unspecified site: Secondary | ICD-10-CM | POA: Diagnosis not present

## 2023-09-29 DIAGNOSIS — I739 Peripheral vascular disease, unspecified: Secondary | ICD-10-CM | POA: Diagnosis not present

## 2023-09-29 DIAGNOSIS — E663 Overweight: Secondary | ICD-10-CM | POA: Diagnosis not present

## 2023-09-29 DIAGNOSIS — I10 Essential (primary) hypertension: Secondary | ICD-10-CM | POA: Diagnosis not present

## 2023-10-12 ENCOUNTER — Telehealth: Payer: Self-pay

## 2023-10-12 NOTE — Telephone Encounter (Signed)
Caller: Patient  Concern: Pt cut some trees down on Tuesday evening and a limb fell on the back of his leg. He c/o a large bruise and knot on his lower leg, pain, swelling, and painful ambulation. No missed doses of Plavix and ASA. He has been taking his prescribed Hydrocodone with improvement in pain.  Aggravating Factors: movement, walking  Treatments: narcotic analgesics including hydrocodone/acetaminophen (Lorcet, Lortab, Norco, Vicodin) and instructed him to take additional OTC pain meds, elevate, use ice/heat, and compression  Resolution: Instructed to call back if symptoms perist or worsen, and call PCP  Next Appt: not at this time

## 2023-10-13 ENCOUNTER — Other Ambulatory Visit: Payer: Self-pay

## 2023-10-13 ENCOUNTER — Encounter (HOSPITAL_BASED_OUTPATIENT_CLINIC_OR_DEPARTMENT_OTHER): Payer: Self-pay | Admitting: Orthopaedic Surgery

## 2023-10-17 ENCOUNTER — Other Ambulatory Visit (HOSPITAL_COMMUNITY): Payer: Self-pay | Admitting: Internal Medicine

## 2023-10-17 ENCOUNTER — Encounter (HOSPITAL_BASED_OUTPATIENT_CLINIC_OR_DEPARTMENT_OTHER)
Admission: RE | Admit: 2023-10-17 | Discharge: 2023-10-17 | Disposition: A | Discharge status: Home / Self Care | Payer: BC Managed Care – PPO | Source: Ambulatory Visit | Attending: Orthopaedic Surgery | Admitting: Orthopaedic Surgery

## 2023-10-17 ENCOUNTER — Ambulatory Visit (HOSPITAL_COMMUNITY)
Admission: RE | Admit: 2023-10-17 | Discharge: 2023-10-17 | Disposition: A | Discharge status: Home / Self Care | Payer: BC Managed Care – PPO | Source: Ambulatory Visit | Attending: Internal Medicine | Admitting: Internal Medicine

## 2023-10-17 DIAGNOSIS — M79605 Pain in left leg: Secondary | ICD-10-CM | POA: Diagnosis not present

## 2023-10-17 DIAGNOSIS — M47816 Spondylosis without myelopathy or radiculopathy, lumbar region: Secondary | ICD-10-CM | POA: Diagnosis not present

## 2023-10-17 DIAGNOSIS — Z01810 Encounter for preprocedural cardiovascular examination: Secondary | ICD-10-CM | POA: Insufficient documentation

## 2023-10-17 DIAGNOSIS — I10 Essential (primary) hypertension: Secondary | ICD-10-CM | POA: Diagnosis not present

## 2023-10-17 DIAGNOSIS — S8012XA Contusion of left lower leg, initial encounter: Secondary | ICD-10-CM | POA: Diagnosis not present

## 2023-10-17 DIAGNOSIS — M1991 Primary osteoarthritis, unspecified site: Secondary | ICD-10-CM | POA: Diagnosis not present

## 2023-10-17 DIAGNOSIS — I739 Peripheral vascular disease, unspecified: Secondary | ICD-10-CM | POA: Diagnosis not present

## 2023-10-17 DIAGNOSIS — G894 Chronic pain syndrome: Secondary | ICD-10-CM | POA: Diagnosis not present

## 2023-10-17 DIAGNOSIS — Z6826 Body mass index (BMI) 26.0-26.9, adult: Secondary | ICD-10-CM | POA: Diagnosis not present

## 2023-10-20 ENCOUNTER — Other Ambulatory Visit: Payer: Self-pay | Admitting: *Deleted

## 2023-10-20 DIAGNOSIS — G894 Chronic pain syndrome: Secondary | ICD-10-CM | POA: Diagnosis not present

## 2023-10-20 DIAGNOSIS — M79605 Pain in left leg: Secondary | ICD-10-CM | POA: Diagnosis not present

## 2023-10-20 DIAGNOSIS — I739 Peripheral vascular disease, unspecified: Secondary | ICD-10-CM | POA: Diagnosis not present

## 2023-10-20 DIAGNOSIS — I771 Stricture of artery: Secondary | ICD-10-CM

## 2023-10-20 DIAGNOSIS — E663 Overweight: Secondary | ICD-10-CM | POA: Diagnosis not present

## 2023-10-20 DIAGNOSIS — M47816 Spondylosis without myelopathy or radiculopathy, lumbar region: Secondary | ICD-10-CM | POA: Diagnosis not present

## 2023-10-20 DIAGNOSIS — S8012XA Contusion of left lower leg, initial encounter: Secondary | ICD-10-CM | POA: Diagnosis not present

## 2023-10-20 DIAGNOSIS — M1991 Primary osteoarthritis, unspecified site: Secondary | ICD-10-CM | POA: Diagnosis not present

## 2023-10-20 DIAGNOSIS — I10 Essential (primary) hypertension: Secondary | ICD-10-CM | POA: Diagnosis not present

## 2023-10-20 DIAGNOSIS — Z6826 Body mass index (BMI) 26.0-26.9, adult: Secondary | ICD-10-CM | POA: Diagnosis not present

## 2023-10-24 ENCOUNTER — Encounter (HOSPITAL_BASED_OUTPATIENT_CLINIC_OR_DEPARTMENT_OTHER)
Admission: RE | Disposition: A | Discharge status: Home / Self Care | Payer: Self-pay | Source: Home / Self Care | Attending: Orthopaedic Surgery

## 2023-10-24 ENCOUNTER — Ambulatory Visit (HOSPITAL_BASED_OUTPATIENT_CLINIC_OR_DEPARTMENT_OTHER): Payer: BC Managed Care – PPO | Admitting: Anesthesiology

## 2023-10-24 ENCOUNTER — Encounter (HOSPITAL_BASED_OUTPATIENT_CLINIC_OR_DEPARTMENT_OTHER): Payer: Self-pay | Admitting: Orthopaedic Surgery

## 2023-10-24 ENCOUNTER — Ambulatory Visit (HOSPITAL_BASED_OUTPATIENT_CLINIC_OR_DEPARTMENT_OTHER)
Admission: RE | Admit: 2023-10-24 | Discharge: 2023-10-24 | Disposition: A | Discharge status: Home / Self Care | Payer: BC Managed Care – PPO | Attending: Orthopaedic Surgery | Admitting: Orthopaedic Surgery

## 2023-10-24 ENCOUNTER — Other Ambulatory Visit: Payer: Self-pay

## 2023-10-24 DIAGNOSIS — Z01818 Encounter for other preprocedural examination: Secondary | ICD-10-CM

## 2023-10-24 DIAGNOSIS — Z86718 Personal history of other venous thrombosis and embolism: Secondary | ICD-10-CM | POA: Insufficient documentation

## 2023-10-24 DIAGNOSIS — I739 Peripheral vascular disease, unspecified: Secondary | ICD-10-CM | POA: Insufficient documentation

## 2023-10-24 DIAGNOSIS — M25812 Other specified joint disorders, left shoulder: Secondary | ICD-10-CM | POA: Insufficient documentation

## 2023-10-24 DIAGNOSIS — M75122 Complete rotator cuff tear or rupture of left shoulder, not specified as traumatic: Secondary | ICD-10-CM

## 2023-10-24 DIAGNOSIS — I10 Essential (primary) hypertension: Secondary | ICD-10-CM | POA: Insufficient documentation

## 2023-10-24 DIAGNOSIS — M75102 Unspecified rotator cuff tear or rupture of left shoulder, not specified as traumatic: Secondary | ICD-10-CM | POA: Insufficient documentation

## 2023-10-24 DIAGNOSIS — G8918 Other acute postprocedural pain: Secondary | ICD-10-CM | POA: Diagnosis not present

## 2023-10-24 DIAGNOSIS — Z95828 Presence of other vascular implants and grafts: Secondary | ICD-10-CM | POA: Diagnosis not present

## 2023-10-24 DIAGNOSIS — Z87891 Personal history of nicotine dependence: Secondary | ICD-10-CM | POA: Diagnosis not present

## 2023-10-24 DIAGNOSIS — M7522 Bicipital tendinitis, left shoulder: Secondary | ICD-10-CM | POA: Diagnosis not present

## 2023-10-24 HISTORY — PX: SHOULDER ARTHROSCOPY WITH ROTATOR CUFF REPAIR: SHX5685

## 2023-10-24 SURGERY — ARTHROSCOPY, SHOULDER, WITH ROTATOR CUFF REPAIR
Anesthesia: General | Site: Shoulder | Laterality: Left

## 2023-10-24 MED ORDER — SODIUM CHLORIDE 0.9 % IR SOLN
Status: DC | PRN
  Administered 2023-10-24: 6000 mL

## 2023-10-24 MED ORDER — HYDROMORPHONE HCL 1 MG/ML IJ SOLN: 0.2500 mg | INTRAMUSCULAR | Status: DC | PRN

## 2023-10-24 MED ORDER — LIDOCAINE HCL (CARDIAC) PF 100 MG/5ML IV SOSY
PREFILLED_SYRINGE | INTRAVENOUS | Status: DC | PRN
  Administered 2023-10-24: 50 mg via INTRAVENOUS

## 2023-10-24 MED ORDER — EPHEDRINE 5 MG/ML INJ
INTRAVENOUS | Status: AC
  Filled 2023-10-24: qty 5

## 2023-10-24 MED ORDER — PROPOFOL 10 MG/ML IV BOLUS
INTRAVENOUS | Status: AC
Start: 2023-10-24 — End: ?
  Filled 2023-10-24: qty 20

## 2023-10-24 MED ORDER — LACTATED RINGERS IV SOLN
INTRAVENOUS | Status: DC
Start: 2023-10-24 — End: 2023-10-24

## 2023-10-24 MED ORDER — FENTANYL CITRATE (PF) 100 MCG/2ML IJ SOLN
100.0000 ug | Freq: Once | INTRAMUSCULAR | Status: AC
  Administered 2023-10-24: 100 ug via INTRAVENOUS

## 2023-10-24 MED ORDER — SUGAMMADEX SODIUM 200 MG/2ML IV SOLN
INTRAVENOUS | Status: DC | PRN
  Administered 2023-10-24: 200 mg via INTRAVENOUS

## 2023-10-24 MED ORDER — CEFAZOLIN SODIUM-DEXTROSE 2-4 GM/100ML-% IV SOLN
2.0000 g | INTRAVENOUS | Status: AC
  Administered 2023-10-24: 2 g via INTRAVENOUS

## 2023-10-24 MED ORDER — SODIUM CHLORIDE 0.9 % IV SOLN: INTRAVENOUS | Status: DC | PRN

## 2023-10-24 MED ORDER — LIDOCAINE 2% (20 MG/ML) 5 ML SYRINGE
INTRAMUSCULAR | Status: AC
  Filled 2023-10-24: qty 5

## 2023-10-24 MED ORDER — PROPOFOL 10 MG/ML IV BOLUS
INTRAVENOUS | Status: AC
  Filled 2023-10-24: qty 20

## 2023-10-24 MED ORDER — ROCURONIUM BROMIDE 100 MG/10ML IV SOLN
INTRAVENOUS | Status: DC | PRN
  Administered 2023-10-24: 60 mg via INTRAVENOUS

## 2023-10-24 MED ORDER — ACETAMINOPHEN 500 MG PO TABS
1000.0000 mg | ORAL_TABLET | Freq: Once | ORAL | Status: AC
  Administered 2023-10-24: 1000 mg via ORAL

## 2023-10-24 MED ORDER — FENTANYL CITRATE (PF) 100 MCG/2ML IJ SOLN
INTRAMUSCULAR | Status: AC
  Filled 2023-10-24: qty 2

## 2023-10-24 MED ORDER — MIDAZOLAM HCL 2 MG/2ML IJ SOLN
INTRAMUSCULAR | Status: AC
  Filled 2023-10-24: qty 2

## 2023-10-24 MED ORDER — ONDANSETRON HCL 4 MG/2ML IJ SOLN
INTRAMUSCULAR | Status: AC
  Filled 2023-10-24: qty 2

## 2023-10-24 MED ORDER — OXYCODONE HCL 5 MG PO TABS: 5.0000 mg | ORAL_TABLET | Freq: Once | ORAL | Status: DC | PRN

## 2023-10-24 MED ORDER — ACETAMINOPHEN 500 MG PO TABS
ORAL_TABLET | ORAL | Status: AC
  Filled 2023-10-24: qty 2

## 2023-10-24 MED ORDER — ONDANSETRON HCL 4 MG/2ML IJ SOLN
INTRAMUSCULAR | Status: DC | PRN
  Administered 2023-10-24: 4 mg via INTRAVENOUS

## 2023-10-24 MED ORDER — MIDAZOLAM HCL 2 MG/2ML IJ SOLN: 0.5000 mg | Freq: Once | INTRAMUSCULAR | Status: DC | PRN

## 2023-10-24 MED ORDER — BUPIVACAINE-EPINEPHRINE (PF) 0.5% -1:200000 IJ SOLN
INTRAMUSCULAR | Status: DC | PRN
  Administered 2023-10-24: 15 mL via PERINEURAL

## 2023-10-24 MED ORDER — PROPOFOL 10 MG/ML IV BOLUS
INTRAVENOUS | Status: DC | PRN
  Administered 2023-10-24: 100 mg via INTRAVENOUS

## 2023-10-24 MED ORDER — TRANEXAMIC ACID-NACL 1000-0.7 MG/100ML-% IV SOLN
INTRAVENOUS | Status: AC
Start: 2023-10-24 — End: ?
  Filled 2023-10-24: qty 100

## 2023-10-24 MED ORDER — CEFAZOLIN SODIUM-DEXTROSE 2-4 GM/100ML-% IV SOLN
INTRAVENOUS | Status: AC
Start: 2023-10-24 — End: ?
  Filled 2023-10-24: qty 100

## 2023-10-24 MED ORDER — EPHEDRINE SULFATE (PRESSORS) 50 MG/ML IJ SOLN
INTRAMUSCULAR | Status: DC | PRN
  Administered 2023-10-24: 10 mg via INTRAVENOUS

## 2023-10-24 MED ORDER — MEPERIDINE HCL 25 MG/ML IJ SOLN: 6.2500 mg | INTRAMUSCULAR | Status: DC | PRN

## 2023-10-24 MED ORDER — ROCURONIUM BROMIDE 10 MG/ML (PF) SYRINGE
PREFILLED_SYRINGE | INTRAVENOUS | Status: AC
  Filled 2023-10-24: qty 10

## 2023-10-24 MED ORDER — OXYCODONE HCL 5 MG/5ML PO SOLN: 5.0000 mg | Freq: Once | ORAL | Status: DC | PRN

## 2023-10-24 MED ORDER — TRANEXAMIC ACID-NACL 1000-0.7 MG/100ML-% IV SOLN
1000.0000 mg | INTRAVENOUS | Status: AC
  Administered 2023-10-24: 1000 mg via INTRAVENOUS

## 2023-10-24 MED ORDER — GABAPENTIN 300 MG PO CAPS
300.0000 mg | ORAL_CAPSULE | Freq: Once | ORAL | Status: AC
  Administered 2023-10-24: 300 mg via ORAL

## 2023-10-24 MED ORDER — MIDAZOLAM HCL 2 MG/2ML IJ SOLN
2.0000 mg | Freq: Once | INTRAMUSCULAR | Status: AC
  Administered 2023-10-24: 2 mg via INTRAVENOUS

## 2023-10-24 MED ORDER — DEXAMETHASONE SODIUM PHOSPHATE 4 MG/ML IJ SOLN
INTRAMUSCULAR | Status: DC | PRN
  Administered 2023-10-24: 5 mg via INTRAVENOUS

## 2023-10-24 MED ORDER — GABAPENTIN 300 MG PO CAPS
ORAL_CAPSULE | ORAL | Status: AC
Start: 2023-10-24 — End: ?
  Filled 2023-10-24: qty 1

## 2023-10-24 MED ORDER — DEXAMETHASONE SODIUM PHOSPHATE 10 MG/ML IJ SOLN
INTRAMUSCULAR | Status: AC
  Filled 2023-10-24: qty 1

## 2023-10-24 MED ORDER — BUPIVACAINE LIPOSOME 1.3 % IJ SUSP
INTRAMUSCULAR | Status: DC | PRN
  Administered 2023-10-24: 10 mL via PERINEURAL

## 2023-10-24 SURGICAL SUPPLY — 58 items
ANCHOR JUGGERKNOT SOFT S-P (Anchor) IMPLANT
ANCHOR QUATTRO LINK KNTLS 2.9 (Anchor) IMPLANT
BLADE EXCALIBUR 4.0X13 (MISCELLANEOUS) ×1 IMPLANT
BURR OVAL 8 FLU 4.0X13 (MISCELLANEOUS) ×1 IMPLANT
CANNULA 5.75X71 LONG (CANNULA) IMPLANT
CANNULA PASSPORT 5 (CANNULA) IMPLANT
CANNULA PASSPORT BUTTON 10-40 (CANNULA) IMPLANT
CANNULA TWIST IN 8.25X7CM (CANNULA) IMPLANT
CHLORAPREP W/TINT 26 (MISCELLANEOUS) ×2 IMPLANT
COOLER ICEMAN CLASSIC (MISCELLANEOUS) ×1 IMPLANT
DRAPE IMP U-DRAPE 54X76 (DRAPES) ×1 IMPLANT
DRAPE INCISE IOBAN 66X45 STRL (DRAPES) ×1 IMPLANT
DRAPE SHOULDER BEACH CHAIR (DRAPES) ×1 IMPLANT
DRAPE U-SHAPE 47X51 STRL (DRAPES) ×2 IMPLANT
DW OUTFLOW CASSETTE/TUBE SET (MISCELLANEOUS) ×1 IMPLANT
GAUZE PAD ABD 8X10 STRL (GAUZE/BANDAGES/DRESSINGS) ×1 IMPLANT
GAUZE SPONGE 4X4 12PLY STRL (GAUZE/BANDAGES/DRESSINGS) ×1 IMPLANT
GAUZE XEROFORM 1X8 LF (GAUZE/BANDAGES/DRESSINGS) ×1 IMPLANT
GLOVE BIO SURGEON STRL SZ 6 (GLOVE) ×1 IMPLANT
GLOVE BIO SURGEON STRL SZ7.5 (GLOVE) ×1 IMPLANT
GLOVE BIOGEL PI IND STRL 6.5 (GLOVE) ×1 IMPLANT
GLOVE BIOGEL PI IND STRL 8 (GLOVE) ×1 IMPLANT
GOWN STRL REUS W/ TWL LRG LVL3 (GOWN DISPOSABLE) ×2 IMPLANT
GOWN STRL REUS W/ TWL XL LVL3 (GOWN DISPOSABLE) ×1 IMPLANT
GOWN STRL REUS W/TWL LRG LVL3 (GOWN DISPOSABLE) ×2
GOWN STRL REUS W/TWL XL LVL3 (GOWN DISPOSABLE) ×1
KIT SHOULDER STAB MARCO (KITS) ×1 IMPLANT
KIT STR SPEAR 1.8 FBRTK DISP (KITS) IMPLANT
LASSO 90 CVE QUICKPAS (DISPOSABLE) IMPLANT
LASSO CRESCENT QUICKPASS (SUTURE) IMPLANT
MANIFOLD NEPTUNE II (INSTRUMENTS) ×1 IMPLANT
NDL HD SCORPION MEGA LOADER (NEEDLE) IMPLANT
NDL SUT PASSER RTC (NEEDLE) IMPLANT
NEEDLE SUT PASSER RTC (NEEDLE) ×1 IMPLANT
PACK ARTHROSCOPY DSU (CUSTOM PROCEDURE TRAY) ×1 IMPLANT
PACK BASIN DAY SURGERY FS (CUSTOM PROCEDURE TRAY) ×1 IMPLANT
PAD COLD SHLDR WRAP-ON (PAD) ×1 IMPLANT
RESTRAINT HEAD UNIVERSAL NS (MISCELLANEOUS) ×1 IMPLANT
SHEET MEDIUM DRAPE 40X70 STRL (DRAPES) ×1 IMPLANT
SLEEVE SCD COMPRESS KNEE MED (STOCKING) ×1 IMPLANT
SUT BROADBAND MINI LOOP BLACK (SUTURE) ×1
SUT BROADBAND MINI LOOP BLUE (SUTURE) ×1
SUT ETHILON 3 0 PS 1 (SUTURE) ×1 IMPLANT
SUT FIBERWIRE #2 38 T-5 BLUE (SUTURE)
SUT PDS AB 1 CT 36 (SUTURE) IMPLANT
SUT TIGER TAPE 7 IN WHITE (SUTURE) IMPLANT
SUTURE BROADBAND MINI LP BLACK (SUTURE) IMPLANT
SUTURE BROADBAND MINI LP BLUE (SUTURE) IMPLANT
SUTURE FIBERWR #2 38 T-5 BLUE (SUTURE) IMPLANT
SUTURE TAPE 1.3 40 TPR END (SUTURE) IMPLANT
SUTURE TAPE TIGERLINK 1.3MM BL (SUTURE) IMPLANT
SUTURETAPE 1.3 40 TPR END (SUTURE)
SUTURETAPE TIGERLINK 1.3MM BL (SUTURE)
TAPE FIBER 2MM 7IN #2 BLUE (SUTURE) IMPLANT
TOWEL GREEN STERILE FF (TOWEL DISPOSABLE) ×2 IMPLANT
TUBE CONNECTING 20X1/4 (TUBING) ×1 IMPLANT
TUBING ARTHROSCOPY IRRIG 16FT (MISCELLANEOUS) ×1 IMPLANT
WAND ABLATOR APOLLO I90 (BUR) ×1 IMPLANT

## 2023-10-24 NOTE — Anesthesia Procedure Notes (Signed)
Procedure Name: Intubation Date/Time: 10/24/2023 12:07 PM  Performed by: Cleda Clarks, CRNAPre-anesthesia Checklist: Patient identified, Emergency Drugs available, Suction available and Patient being monitored Patient Re-evaluated:Patient Re-evaluated prior to induction Oxygen Delivery Method: Circle system utilized Preoxygenation: Pre-oxygenation with 100% oxygen Induction Type: IV induction Ventilation: Mask ventilation without difficulty Laryngoscope Size: Miller and 2 Grade View: Grade I Tube type: Oral Tube size: 7.0 mm Number of attempts: 1 Airway Equipment and Method: Stylet Placement Confirmation: ETT inserted through vocal cords under direct vision, positive ETCO2 and breath sounds checked- equal and bilateral Secured at: 20 cm Tube secured with: Tape Dental Injury: Teeth and Oropharynx as per pre-operative assessment

## 2023-10-24 NOTE — Progress Notes (Signed)
Assisted Dr. Jairo Ben with left, supraclavicular, ultrasound guided block. Side rails up, monitors on throughout procedure. See vital signs in flow sheet. Tolerated Procedure well.

## 2023-10-24 NOTE — Transfer of Care (Signed)
Immediate Anesthesia Transfer of Care Note  Patient: Preston Davis  Procedure(s) Performed: LEFT SHOULDER ARTHROSCOPY ROTATOR CUFF REPAIR (Left: Shoulder)  Patient Location: PACU  Anesthesia Type:General and Regional  Level of Consciousness: awake, alert , and oriented  Airway & Oxygen Therapy: Patient Spontanous Breathing and Patient connected to face mask oxygen  Post-op Assessment: Report given to RN and Post -op Vital signs reviewed and stable  Post vital signs: Reviewed and stable  Last Vitals:  Vitals Value Taken Time  BP 134/74 10/24/23 1340  Temp    Pulse 80 10/24/23 1344  Resp 17 10/24/23 1344  SpO2 97 % 10/24/23 1344  Vitals shown include unfiled device data.  Last Pain:  Vitals:   10/24/23 0922  PainSc: 5       Patients Stated Pain Goal: 3 (10/24/23 6213)  Complications: No notable events documented.

## 2023-10-24 NOTE — H&P (Signed)
           Procedure Note   Patient: Preston Davis                                                         Date of Birth: Sep 09, 1971                                                     MRN: [id number]                                                               Visit Date: 09/25/2023   Procedures: Visit Diagnoses: No diagnosis found.   Large Joint Inj: L subacromial bursa on 09/25/2023 11:43 AM Indications: pain Details: 22 G 1.5 in needle, ultrasound-guided anterior approach   Arthrogram: No   Medications: 4 mL lidocaine 1 %; 80 mg triamcinolone acetonide 40 MG/ML Outcome: tolerated well, no immediate complications Procedure, treatment alternatives, risks and benefits explained, specific risks discussed. Consent was given by the patient. Immediately prior to procedure a time out was called to verify the correct patient, procedure, equipment, support staff and site/side marked as required. Patient was prepped and draped in the usual sterile fashion.                After a lengthy discussion of treatment options, including risks, benefits, alternatives, complications of surgical and nonsurgical conservative options, the patient elected surgical repair.    The patient  is aware of the material risks  and complications including, but not limited to injury to adjacent structures, neurovascular injury, infection, numbness, bleeding, implant failure, thermal burns, stiffness, persistent pain, failure to heal, disease transmission from allograft, need for further surgery, dislocation, anesthetic risks, blood clots, risks of death,and others. The probabilities of surgical success and failure discussed with patient given their particular co-morbidities.The time and nature of expected rehabilitation and recovery was discussed.The patient's questions were all answered preoperatively.  No barriers to understanding were noted. I explained the natural history of the disease process and Rx  rationale.  I explained to the patient what I considered to be reasonable expectations given their personal situation.  The final treatment plan was arrived at through a shared patient decision making process model.               I personally saw and evaluated the patient, and participated in the management and treatment plan.   Huel Cote, MD Attending Physician, Orthopedic Surgery   This document was dictated using Dragon voice recognition software. A reasonable attempt at proof reading has been made to minimize errors.

## 2023-10-24 NOTE — Anesthesia Preprocedure Evaluation (Addendum)
Anesthesia Evaluation  Patient identified by MRN, date of birth, ID band Patient awake    Reviewed: Allergy & Precautions, NPO status , Patient's Chart, lab work & pertinent test results, reviewed documented beta blocker date and time   History of Anesthesia Complications Negative for: history of anesthetic complications  Airway Mallampati: II  TM Distance: >3 FB Neck ROM: Full    Dental  (+) Edentulous Upper, Upper Dentures, Dental Advisory Given, Missing   Pulmonary former smoker   breath sounds clear to auscultation       Cardiovascular hypertension, Pt. on medications and Pt. on home beta blockers (-) angina + Peripheral Vascular Disease (L subclavian artery stent) and + DVT (off of Eliquis presently)   Rhythm:Regular Rate:Normal     Neuro/Psych negative neurological ROS     GI/Hepatic Neg liver ROS,GERD  Controlled,,  Endo/Other  negative endocrine ROS    Renal/GU negative Renal ROS     Musculoskeletal   Abdominal   Peds  Hematology Factor V Leiden Plavix, ASA   Anesthesia Other Findings   Reproductive/Obstetrics                             Anesthesia Physical Anesthesia Plan  ASA: 3  Anesthesia Plan: General   Post-op Pain Management: Regional block* and Tylenol PO (pre-op)*   Induction: Intravenous  PONV Risk Score and Plan: 2 and Ondansetron and Dexamethasone  Airway Management Planned: Oral ETT  Additional Equipment: None  Intra-op Plan:   Post-operative Plan: Extubation in OR  Informed Consent: I have reviewed the patients History and Physical, chart, labs and discussed the procedure including the risks, benefits and alternatives for the proposed anesthesia with the patient or authorized representative who has indicated his/her understanding and acceptance.     Dental advisory given  Plan Discussed with: CRNA and Surgeon  Anesthesia Plan Comments: (Plan  routine monitors, GA with supraclav block for post op analgesia)        Anesthesia Quick Evaluation

## 2023-10-24 NOTE — Op Note (Signed)
Date of Surgery: 10/24/2023  INDICATIONS: Preston Davis is a 52 y.o.-year-old male with left shoulder rotator cuff tear.  The risk and benefits of the procedure were discussed in detail and documented in the pre-operative evaluation.   PREOPERATIVE DIAGNOSES: Left shoulder, chronic rotator cuff tear, biceps tendinitis, and subacromial impingement.  POSTOPERATIVE DIAGNOSIS: Same.  PROCEDURE: Arthroscopic extensive debridement - 29823 Subdeltoid Bursa, Supraspinatus Tendon, and Anterior Labrum Arthroscopic subacromial decompression - 60454 Arthroscopic rotator cuff repair - 09811 Arthroscopic biceps tenodesis - 91478  SURGEON: Benancio Deeds MD  ASSISTANT: Ardeen Fillers, ATC  ANESTHESIA:  general plus interscalene   IV FLUIDS AND URINE: See anesthesia record.  ANTIBIOTICS: Ancef  ESTIMATED BLOOD LOSS: 10 mL.  IMPLANTS:  Implant Name Type Inv. Item Serial No. Manufacturer Lot No. LRB No. Used Action  ANCHOR QUATTRO LINK KNTLS 2.9 - [id number] Anchor ANCHOR QUATTRO LINK KNTLS 2.9  [employer](ORTH,TRAU,BIO,SG) [id number] Left 1 Implanted  [employer]     [id number] Left 1 Implanted  ANCHOR QUATTRO LINK KNTLS 2.9 - [id number] Anchor ANCHOR QUATTRO LINK KNTLS 2.9  [employer](ORTH,TRAU,BIO,SG) [id number] Left 1 Implanted    DRAINS: None  CULTURES: None  COMPLICATIONS: none  PROCEDURE:    OPERATIVE FINDING: Exam under anesthesia:   Examination under anesthesia revealed forward elevation of 150 degrees.  With the arm at the side, there was 65 degrees of external rotation.  There is a 1+ anterior load shift and a 1+ posterior load shift.    Arthroscopic findings demonstrated: Articular space: Grade 1 cartilage changes Chondral surfaces: As above Biceps: Tendonitis Subscapularis: Intact Supraspinatus: Full thickness tear Infraspinatus: Intact    I identified the patient in the pre-operative holding area.  I marked the operative right shoulder with my initials. I reviewed  the risks and benefits of the proposed surgical intervention and the patient wished to proceed.  Anesthesia was then performed with regional block.  The patient was transferred to the operative suite and placed in the beach chair position with all bony prominences padded.     SCDs were placed on bilateral lower extremity. Appropriate antibiotics was administered within 1 hour before incision.  Anesthesia was induced.  The operative extremity was then prepped and draped in standard fashion. A time out was performed confirming the correct extremity, correct patient and correct procedure.   The arthroscope was introduced in the glenohumeral joint from a posterior portal.  An anterior portal was created.  The shoulder was examined and the above findings were noted.     With an arthroscopic shaver and a wand ablator, synovitis throughout the shoulder was resected.  The arthroscopic shaver was used to excise torn portions of the labrum back to a stable margin. Specifically this was done for the anterior superior and posterior labrum.   At this time the biceps was tagged with a self passing device with a #2 nonabsorbable suture 2 times.  This was done through the anterior portal.  At this point a 2.9 Quatro anchor was used to insert this into the native groove of the lesser tuberosity.  A lateral portal was established and the greater tuberosity was prepared with an arthroscopic shaver.  The wand was used as well.  A medial row all suture anchor was utilized.     The rotator cuff was then approached through the subacromial space. Anterior, lateral, and posterior portals were used.  Bursectomy was performed with an arthroscopic shaver and [employer] wand.  The soft tissues on the undersurface of the acromion and  clavicle were resected with the arthroscopic shaver and wand. There was good excursion that was noted of the tendon back to its footprint which would be amendable to an repair.   The rotator cuff was  repaired with a double row configuration with two medial row all suture suture anchors as noted above with sutures passed from anterior to posterior in horizontal fashion with a self passing suture device.  A total of 2 limbs of suture were passed through the medial rotator cuff.  The sutures were placed with a single lateral row anchor.  This provided excellent anatomic footprint approximation.   The shoulder was irrigated.  The arthroscopic instruments were removed.  Wounds were closed with 3-0 nylon sutures.  A sterile dressing was applied with xeroform, 4x8s, abdominal pad, and tape. An Flonnie Hailstone was placed and the upper extremity was placed in a shoulder immobilizer.  The patient tolerated the procedure well and was taken to the recovery room in stable condition.  All counts were correct in the case. The patient tolerated the procedure well and was taken to the recovery room in stable condition.    POSTOPERATIVE PLAN: He will follow the rotator cuff repair protocol.  He may resume his home blood thinner on postop day 1.  I will see him back in 2 weeks for suture removal  Benancio Deeds, MD 1:49 PM

## 2023-10-24 NOTE — H&P (Signed)
Chief Complaint: Left shoulder deltoid pain        History of Present Illness:    09/25/2023: Presents today for follow-up of his left shoulder.  He is hoping for an additional ultrasound-guided injection as he is having significant pain after helping to move his mother cross-country.   Preston Davis is a 52 y.o. male left-hand-dominant male presents with pain referring over the deltoid and shoulder now for the last several months.  He does have a history of stent placed with Dr. Lenell Antu in November of this year.  He states that that did improve his claudication type symptoms.  He has residual pain about the lateral deltoid which is difficult with overhead activity.  He has a hard time laying directly on the side.  Does have a history of right shoulder rotator cuff repaired in 2014 although this pain feels dissimilar to that.       Surgical History:   none   PMH/PSH/Family History/Social History/Meds/Allergies:         Past Medical History:  Diagnosis Date   Acid reflux     C. difficile colitis     DVT (deep venous thrombosis) (HCC) 2014    left leg   Factor 5 Leiden mutation, heterozygous (HCC)     Heart murmur     High blood cholesterol level     Hypertension     Right shoulder pain               Past Surgical History:  Procedure Laterality Date   AORTIC ARCH ANGIOGRAPHY N/A 10/07/2022    Procedure: AORTIC ARCH ANGIOGRAPHY;  Surgeon: Leonie Douglas, MD;  Location: [facility];  Service: Cardiovascular;  Laterality: N/A;   APPENDECTOMY       bone graft  right great toe        for bunion   PERIPHERAL VASCULAR INTERVENTION Left 10/07/2022    Procedure: PERIPHERAL VASCULAR INTERVENTION;  Surgeon: Leonie Douglas, MD;  Location: [facility];  Service: Cardiovascular;  Laterality: Left;  Subclavian   SHOULDER OPEN ROTATOR CUFF REPAIR Right 05/14/2013    Procedure: ROTATOR CUFF REPAIR SHOULDER OPEN;  Surgeon: Darreld Mclean, MD;  Location:  [facility];  Service: Orthopedics;  Laterality: Right;   VASECTOMY            Social History         Socioeconomic History   Marital status: Married      Spouse name: Not on file   Number of children: Not on file   Years of education: Not on file   Highest education level: Not on file  Occupational History   Not on file  Tobacco Use   Smoking status: Former      Current packs/day: 0.00      Types: Cigarettes      Quit date: 06/03/2013      Years since quitting: 10.3      Passive exposure: Never   Smokeless tobacco: Never  Vaping Use   Vaping status: Never Used  Substance and Sexual Activity   Alcohol use: No   Drug use: No   Sexual activity: Yes      Birth control/protection: Other-see comments      Comment: had vasectomy  Other Topics Concern   Not on file  Social History Narrative   Not on file    Social Determinants of Health    Financial Resource Strain: Not on file  Food Insecurity:  Not on file  Transportation Needs: Not on file  Physical Activity: Not on file  Stress: Not on file  Social Connections: Not on file         Family History  Problem Relation Age of Onset   Clotting disorder Mother     Diabetes Paternal Grandmother          Allergies       Allergies  Allergen Reactions   Benicar [Olmesartan] Other (See Comments)      REACTION: Unable to regulate blood pressure with this medication--caused BP levels to drop too low.            Current Outpatient Medications  Medication Sig Dispense Refill   ascorbic acid (VITAMIN C) 500 MG tablet Take 500 mg by mouth daily.       aspirin EC 81 MG tablet Take 81 mg by mouth daily. Swallow whole.       atenolol (TENORMIN) 25 MG tablet Take 25 mg by mouth daily.       atorvastatin (LIPITOR) 40 MG tablet Take 1 tablet (40 mg total) by mouth daily. 30 tablet 11   clopidogrel (PLAVIX) 75 MG tablet Take 1 tablet (75 mg total) by mouth daily. 30 tablet 11   ezetimibe (ZETIA) 10 MG tablet Take 10 mg by mouth  daily.       HYDROcodone-acetaminophen (NORCO) 10-325 MG tablet Take 1 tablet by mouth every 6 (six) hours.       oxyCODONE (ROXICODONE) 5 MG immediate release tablet Take 1 tablet (5 mg total) by mouth every 4 (four) hours as needed for severe pain or breakthrough pain. 15 tablet 0   sildenafil (VIAGRA) 100 MG tablet Take 50-100 mg by mouth daily as needed for erectile dysfunction.       zolpidem (AMBIEN) 10 MG tablet Take 10 mg by mouth at bedtime.          No current facility-administered medications for this visit.      Imaging Results (Last 48 hours)  No results found.     Review of Systems:   A ROS was performed including pertinent positives and negatives as documented in the HPI.   Physical Exam :   Constitutional: NAD and appears stated age Neurological: Alert and oriented Psych: Appropriate affect and cooperative There were no vitals taken for this visit.    Comprehensive Musculoskeletal Exam:     Musculoskeletal Exam      Inspection Right Left  Skin No atrophy or winging No atrophy or winging  Palpation      Tenderness none Lateral deltoid  Range of Motion      Flexion (passive) 170 170  Flexion (active) 170 170  Abduction 170 170  ER at the side 70 70  Can reach behind back to T12 T12  Strength        Full Full with pain  Special Tests      Pseudoparalytic No No  Neurologic      Fires PIN, radial, median, ulnar, musculocutaneous, axillary, suprascapular, long thoracic, and spinal accessory innervated muscles. No abnormal sensibility  Vascular/Lymphatic      Radial Pulse 2+ 2+  Cervical Exam      Patient has symmetric cervical range of motion with negative Spurling's test.  Special Test: Positive Neer impingement        Imaging:   Xray (3 views left shoulder): Normal   MRI left shoulder: There is a articular sided tear of the rotator cuff of the supraspinatus which  is approximately 50%, intrasubstance biceps tendon tearing   I personally reviewed and  interpreted the radiographs.     Assessment:   52 y.o. male with left shoulder pain in the setting of a rotator cuff tear.  At this time he has trialed an injection as well as his home strengthening exercise program which she had used for his previous right shoulder.  None of this is improving his symptoms.  Given this we did discuss the possibility of arthroscopic intervention.  I did discuss possible left shoulder arthroscopy with acromioplasty, rotator cuff repair versus collagen patch augmentation, biceps tenodesis.  We did discuss the rehab associated with this.  I did discuss the possibility of 6 weeks of nonweightbearing should he require a rotator cuff repair.  After discussion he is elected for surgical repair   With today's visit he is requesting 1 additional ultrasound-guided injection which she has performed at least 1 month prior to surgery   Plan :     -Plan for left shoulder arthroscopy with acromioplasty, rotator cuff repair versus collagen patch augmentation, possible biceps tenodesis          Procedure Note   Patient: Preston Davis                                                         Date of Birth: Aug 06, 1971                                                     MRN: [id number]                                                               Visit Date: 09/25/2023   Procedures: Visit Diagnoses: No diagnosis found.   Large Joint Inj: L subacromial bursa on 09/25/2023 11:43 AM Indications: pain Details: 22 G 1.5 in needle, ultrasound-guided anterior approach   Arthrogram: No   Medications: 4 mL lidocaine 1 %; 80 mg triamcinolone acetonide 40 MG/ML Outcome: tolerated well, no immediate complications Procedure, treatment alternatives, risks and benefits explained, specific risks discussed. Consent was given by the patient. Immediately prior to procedure a time out was called to verify the correct patient, procedure, equipment, support staff and site/side marked as required.  Patient was prepped and draped in the usual sterile fashion.                After a lengthy discussion of treatment options, including risks, benefits, alternatives, complications of surgical and nonsurgical conservative options, the patient elected surgical repair.    The patient  is aware of the material risks  and complications including, but not limited to injury to adjacent structures, neurovascular injury, infection, numbness, bleeding, implant failure, thermal burns, stiffness, persistent pain, failure to heal, disease transmission from allograft, need for further surgery, dislocation, anesthetic risks, blood clots, risks of death,and others. The probabilities of surgical success and failure discussed with patient given their particular co-morbidities.The time and nature of  expected rehabilitation and recovery was discussed.The patient's questions were all answered preoperatively.  No barriers to understanding were noted. I explained the natural history of the disease process and Rx rationale.  I explained to the patient what I considered to be reasonable expectations given their personal situation.  The final treatment plan was arrived at through a shared patient decision making process model.               I personally saw and evaluated the patient, and participated in the management and treatment plan.   Huel Cote, MD Attending Physician, Orthopedic Surgery   This document was dictated using Dragon voice recognition software. A reasonable attempt at proof reading has been made to minimize errors.

## 2023-10-24 NOTE — Anesthesia Postprocedure Evaluation (Signed)
Anesthesia Post Note  Patient: Preston Davis  Procedure(s) Performed: LEFT SHOULDER ARTHROSCOPY ROTATOR CUFF REPAIR (Left: Shoulder)     Patient location during evaluation: PACU Anesthesia Type: General Level of consciousness: sedated and patient cooperative Pain management: pain level controlled Vital Signs Assessment: post-procedure vital signs reviewed and stable Respiratory status: spontaneous breathing Cardiovascular status: stable Anesthetic complications: no   No notable events documented.  Last Vitals:  Vitals:   10/24/23 1415 10/24/23 1430  BP: 109/73 111/74  Pulse: 60 (!) 56  Resp: (!) 26 18  Temp:    SpO2: 96% 96%    Last Pain:  Vitals:   10/24/23 1430  PainSc: 0-No pain                 Lewie Loron

## 2023-10-24 NOTE — Discharge Instructions (Addendum)
Discharge Instructions    Attending Surgeon: Huel Cote, MD Office Phone Number: [phone]   Diagnosis and Procedures:    Surgeries Performed: Shoulder rotator cuff repair, biceps tenodesis  Discharge Plan:    Diet: Resume usual diet. Begin with light or bland foods.  Drink plenty of fluids.  Activity:  Keep sling and dressing in place until your follow up visit in Physical Therapy You are advised to go home directly from the hospital or surgical center. Restrict your activities.  GENERAL INSTRUCTIONS: 1.  Keep your surgical site elevated above your heart for at least 5-7 days or longer to prevent swelling. This will improve your comfort and your overall recovery following surgery.     2. Please call Dr. Serena Croissant office at [phone] with questions Monday-Friday during business hours. If no one answers, please leave a message and someone should get back to the patient within 24 hours. For emergencies please call 911 or proceed to the emergency room.   3. Patient to notify surgical team if experiences any of the following: Bowel/Bladder dysfunction, uncontrolled pain, nerve/muscle weakness, incision with increased drainage or redness, nausea/vomiting and Fever greater than 101.0 F.  Be alert for signs of infection including redness, streaking, odor, fever or chills. Be alert for excessive pain or bleeding and notify your surgeon immediately.  WOUND INSTRUCTIONS:   Leave your dressing/cast/splint in place until your post operative visit.  Keep it clean and dry.  Always keep the incision clean and dry until the staples/sutures are removed. If there is no drainage from the incision you should keep it open to air. If there is drainage from the incision you must keep it covered at all times until the drainage stops  Do not soak in a bath tub, hot tub, pool, lake or other body of water until 21 days after your surgery and your incision is completely dry and healed.  If  you have removable sutures (or staples) they must be removed 10-14 days (unless otherwise instructed) from the day of your surgery.     1)  Elevate the extremity as much as possible.  2)  Keep the dressing clean and dry.  3)  Please call us if the dressing becomes wet or dirty.  4)  If you are experiencing worsening pain or worsening swelling, please call.     MEDICATIONS: Resume all previous home medications at the previous prescribed dose and frequency unless otherwise noted Start taking the  pain medications on an as-needed basis as prescribed  Please taper down pain medication over the next week following surgery.  Ideally you should not require a refill of any narcotic pain medication.  Take pain medication with food to minimize nausea. In addition to the prescribed pain medication, you may take over-the-counter pain relievers such as Tylenol.  Do NOT take additional tylenol if your pain medication already has tylenol in it.  Aspirin 325mg  daily per bottle instructions. Narcotic Policy: Per [facility] clinic policy, our goal is ensure optimal postoperative pain control with a multimodal pain management strategy. For all [facility] patients, our goal is to wean post-operative narcotic medications by 6 weeks post-operatively, and many times sooner. If this is not possible due to utilization of pain medication prior to surgery, your [facility] doctor will support your acute post-operative pain control for the first 6 weeks postoperatively, with a plan to transition you back to your primary pain team following that. Cyndia Skeeters will work to ensure a Therapist, occupational.  FOLLOWUP INSTRUCTIONS: 1. Follow up at the Physical Therapy Clinic 3-4 days following surgery. This appointment should be scheduled unless other arrangements have been made.The Physical Therapy scheduling number is [phone] if an appointment has not already been arranged.  2. Contact Dr. Serena Croissant office during office hours at  [phone] or the practice after hours line at [phone] for non-emergencies. For medical emergencies call 911.   Discharge Location: Home  No Tylenol until after 3:30pm today if needed  Post Anesthesia Home Care Instructions  Activity: Get plenty of rest for the remainder of the day. A responsible individual must stay with you for 24 hours following the procedure.  For the next 24 hours, DO NOT: -Drive a car -Advertising copywriter -Drink alcoholic beverages -Take any medication unless instructed by your physician -Make any legal decisions or sign important papers.  Meals: Start with liquid foods such as gelatin or soup. Progress to regular foods as tolerated. Avoid greasy, spicy, heavy foods. If nausea and/or vomiting occur, drink only clear liquids until the nausea and/or vomiting subsides. Call your physician if vomiting continues.  Special Instructions/Symptoms: Your throat may feel dry or sore from the anesthesia or the breathing tube placed in your throat during surgery. If this causes discomfort, gargle with warm salt water. The discomfort should disappear within 24 hours.  If you had a scopolamine patch placed behind your ear for the management of post- operative nausea and/or vomiting:  1. The medication in the patch is effective for 72 hours, after which it should be removed.  Wrap patch in a tissue and discard in the trash. Wash hands thoroughly with soap and water. 2. You may remove the patch earlier than 72 hours if you experience unpleasant side effects which may include dry mouth, dizziness or visual disturbances. 3. Avoid touching the patch. Wash your hands with soap and water after contact with the patch.    Regional Anesthesia Blocks  1. You may not be able to move or feel the "blocked" extremity after a regional anesthetic block. This may last may last from 3-48 hours after placement, but it will go away. The length of time depends on the medication injected and  your individual response to the medication. As the nerves start to wake up, you may experience tingling as the movement and feeling returns to your extremity. If the numbness and inability to move your extremity has not gone away after 48 hours, please call your surgeon.   2. The extremity that is blocked will need to be protected until the numbness is gone and the strength has returned. Because you cannot feel it, you will need to take extra care to avoid injury. Because it may be weak, you may have difficulty moving it or using it. You may not know what position it is in without looking at it while the block is in effect.  3. For blocks in the legs and feet, returning to weight bearing and walking needs to be done carefully. You will need to wait until the numbness is entirely gone and the strength has returned. You should be able to move your leg and foot normally before you try and bear weight or walk. You will need someone to be with you when you first try to ensure you do not fall and possibly risk injury.  4. Bruising and tenderness at the needle site are common side effects and will resolve in a few days.  5. Persistent numbness or new problems with movement should be  communicated to the surgeon or the [facility] [phone] [facility] [phone]).

## 2023-10-24 NOTE — Interval H&P Note (Signed)
History and Physical Interval Note:  10/24/2023 11:34 AM  Preston Davis  has presented today for surgery, with the diagnosis of LEFT SHOULDER COMPLETE ROTATOR CUFF TEAR.  The various methods of treatment have been discussed with the patient and family. After consideration of risks, benefits and other options for treatment, the patient has consented to  Procedure(s): LEFT SHOULDER ARTHROSCOPY WITH POSSIBLE ROTATOR CUFF REPAIR (Left) LEFT SHOULDER ACROMIOPLASTY / POSSIBLE COLLAGEN PATCH AUGMENTATION (Left) as a surgical intervention.  The patient's history has been reviewed, patient examined, no change in status, stable for surgery.  I have reviewed the patient's chart and labs.  Questions were answered to the patient's satisfaction.     Huel Cote

## 2023-10-24 NOTE — Brief Op Note (Signed)
   Brief Op Note  Date of Surgery: 10/24/2023  Preoperative Diagnosis: LEFT SHOULDER COMPLETE ROTATOR CUFF TEAR  Postoperative Diagnosis: same  Procedure: Procedure(s): LEFT SHOULDER ARTHROSCOPY ROTATOR CUFF REPAIR  Implants: Implant Name Type Inv. Item Serial No. Manufacturer Lot No. LRB No. Used Action  ANCHOR QUATTRO LINK KNTLS 2.9 - [id number] Anchor ANCHOR QUATTRO LINK KNTLS 2.9  [employer](ORTH,TRAU,BIO,SG) [id number] Left 1 Implanted  [employer]     [id number] Left 1 Implanted  ANCHOR QUATTRO LINK KNTLS 2.9 - [id number] Anchor ANCHOR QUATTRO LINK KNTLS 2.9  [employer](ORTH,TRAU,BIO,SG) [id number] Left 1 Implanted    Surgeons: Surgeon(s): Huel Cote, MD  Anesthesia: General    Estimated Blood Loss: See anesthesia record  Complications: None  Condition to PACU: Stable  Benancio Deeds, MD 10/24/2023 1:47 PM

## 2023-10-24 NOTE — Anesthesia Procedure Notes (Signed)
Anesthesia Regional Block: Interscalene brachial plexus block   Pre-Anesthetic Checklist: , timeout performed,  Correct Patient, Correct Site, Correct Laterality,  Correct Procedure, Correct Position, site marked,  Risks and benefits discussed,  Surgical consent,  Pre-op evaluation,  At surgeon's request and post-op pain management  Laterality: Left and Upper  Prep: chloraprep       Needles:  Injection technique: Single-shot  Needle Type: Echogenic Needle     Needle Length: 9cm  Needle Gauge: 21     Additional Needles:   Procedures:,,,, ultrasound used (permanent image in chart),,    Narrative:  Start time: 10/24/2023 11:20 AM End time: 10/24/2023 11:26 AM Injection made incrementally with aspirations every 5 mL.  Performed by: Personally  Anesthesiologist: Jairo Ben, MD  Additional Notes: Pt identified in Holding room.  Monitors applied. Working IV access confirmed. Timeout, Sterile prep L clavicle and neck.  #21ga ECHOgenic Arrow block needle to interscalene brachial plexus with US guidance.  15cc 0.5% Bupivacaine 1:200k epi, exparel injected incrementally after negative test dose.  Arm discomfort with second 5cc injection, needle repositioned and no further discomfort. Patient asymptomatic, VSS, no heme aspirated, tolerated well.  Sandford Craze, MD

## 2023-10-25 ENCOUNTER — Encounter (HOSPITAL_BASED_OUTPATIENT_CLINIC_OR_DEPARTMENT_OTHER): Payer: Self-pay | Admitting: Orthopaedic Surgery

## 2023-10-26 ENCOUNTER — Other Ambulatory Visit: Payer: Self-pay | Admitting: Vascular Surgery

## 2023-10-26 NOTE — Telephone Encounter (Signed)
Please send refill request for Atorvastatin to PCP

## 2023-10-27 ENCOUNTER — Other Ambulatory Visit (HOSPITAL_BASED_OUTPATIENT_CLINIC_OR_DEPARTMENT_OTHER): Payer: Self-pay | Admitting: Orthopaedic Surgery

## 2023-10-31 ENCOUNTER — Ambulatory Visit (HOSPITAL_BASED_OUTPATIENT_CLINIC_OR_DEPARTMENT_OTHER): Payer: BC Managed Care – PPO | Attending: Orthopaedic Surgery | Admitting: Physical Therapy

## 2023-10-31 ENCOUNTER — Other Ambulatory Visit: Payer: Self-pay

## 2023-10-31 ENCOUNTER — Encounter (HOSPITAL_BASED_OUTPATIENT_CLINIC_OR_DEPARTMENT_OTHER): Payer: Self-pay | Admitting: Physical Therapy

## 2023-10-31 DIAGNOSIS — M6281 Muscle weakness (generalized): Secondary | ICD-10-CM | POA: Insufficient documentation

## 2023-10-31 DIAGNOSIS — M75122 Complete rotator cuff tear or rupture of left shoulder, not specified as traumatic: Secondary | ICD-10-CM | POA: Diagnosis not present

## 2023-10-31 DIAGNOSIS — M25612 Stiffness of left shoulder, not elsewhere classified: Secondary | ICD-10-CM | POA: Insufficient documentation

## 2023-10-31 DIAGNOSIS — M25512 Pain in left shoulder: Secondary | ICD-10-CM | POA: Insufficient documentation

## 2023-10-31 NOTE — Therapy (Signed)
OUTPATIENT PHYSICAL THERAPY UPPER EXTREMITY EVALUATION   Patient Name: Preston Davis MRN: [id number] DOB:Aug 18, 1971, 52 y.o., male Today's Date: 10/31/2023  END OF SESSION:   Past Medical History:  Diagnosis Date   Acid reflux    C. difficile colitis    DVT (deep venous thrombosis) (HCC) 2014   left leg   Factor 5 Leiden mutation, heterozygous (HCC)    Heart murmur    High blood cholesterol level    Hypertension    Right shoulder pain    Past Surgical History:  Procedure Laterality Date   AORTIC ARCH ANGIOGRAPHY N/A 10/07/2022   Procedure: AORTIC ARCH ANGIOGRAPHY;  Surgeon: Leonie Douglas, MD;  [facility];  Service: Cardiovascular;  Laterality: N/A;   APPENDECTOMY     bone graft  right great toe     for bunion   PERIPHERAL VASCULAR INTERVENTION Left 10/07/2022   Procedure: PERIPHERAL VASCULAR INTERVENTION;  Surgeon: Leonie Douglas, MD;  [facility];  Service: Cardiovascular;  Laterality: Left;  Subclavian   SHOULDER ARTHROSCOPY WITH ROTATOR CUFF REPAIR Left 10/24/2023   Procedure: LEFT SHOULDER ARTHROSCOPY ROTATOR CUFF REPAIR;  Surgeon: Huel Cote, MD;  Location: [facility];  Service: Orthopedics;  Laterality: Left;   SHOULDER OPEN ROTATOR CUFF REPAIR Right 05/14/2013   Procedure: ROTATOR CUFF REPAIR SHOULDER OPEN;  Surgeon: Darreld Mclean, MD;  Location: [facility];  Service: Orthopedics;  Laterality: Right;   VASECTOMY     Patient Active Problem List   Diagnosis Date Noted   Nontraumatic complete tear of left rotator cuff 10/24/2023   HTN (hypertension) 12/16/2017   C. difficile colitis 12/15/2017    PCP: Elfredia Nevins, MD  REFERRING PROVIDER: Dr Huel Cote   REFERRING DIAG: LEFT SHOULDER COMPLETE ROTATOR CUFF TEAR   THERAPY DIAG:  No diagnosis found.  Rationale for Evaluation and Treatment: Rehabilitation  ONSET DATE: DOS: 10/24/2023  SUBJECTIVE:                                                                                                                                                                                       SUBJECTIVE STATEMENT: Patient has 3 to 86-month history of left shoulder pain.  In the past she has had a right rotator cuff repair.  On 10/24/2023 he had a left rotator cuff repair.  He is currently using a sling.  He is compliant with sling use.  He does have more pain at night in the morning.  At this time his pain is well-controlled. Hand dominance: Left  PERTINENT HISTORY: Right rotator rotator cuff repair, factor V Leiden mutation Peripheral vascular intervention 11/3 HTN,  PAIN:  Are you having pain? Yes: NPRS scale: 8/10 Pain location: anterior and superior shoulder  Pain description: aching  Aggravating factors: moving thr arm  Relieving factors: rest   PRECAUTIONS: None  RED FLAGS: None   WEIGHT BEARING RESTRICTIONS: Yes NWB  FALLS:  Has patient fallen in last 6 months? Yes. Number of falls fell 1x furiture fell on you.  LIVING ENVIRONMENT: Nothing  OCCUPATION: Doctor, hospital   Hobbies:  Banker    PLOF: Independent  PATIENT GOALS:  To have less pain   NEXT MD VISIT:  Nothing scheduled   OBJECTIVE:  Note: Objective measures were completed at Evaluation unless otherwise noted.  DIAGNOSTIC FINDINGS:  Nothing at this time   PATIENT SURVEYS :  FOTO    COGNITION: Overall cognitive status: Within functional limits for tasks assessed     SENSATION: Has had some numbness in his hand  POSTURE: Good   UPPER EXTREMITY ROM:   Passive ROM Right eval Left eval  Shoulder flexion  68  Shoulder extension    Shoulder abduction    Shoulder adduction    Shoulder internal rotation  Can last on stomach  Shoulder external rotation  4  Elbow flexion    Elbow extension    Wrist flexion    Wrist extension    Wrist ulnar deviation    Wrist radial deviation    Wrist pronation    Wrist supination     (Blank rows = not tested)  UPPER EXTREMITY MMT:  MMT Right eval Left eval  Shoulder flexion    Shoulder extension    Shoulder abduction    Shoulder adduction    Shoulder internal rotation    Shoulder external rotation    Middle trapezius    Lower trapezius    Elbow flexion    Elbow extension    Wrist flexion    Wrist extension    Wrist ulnar deviation    Wrist radial deviation    Wrist pronation    Wrist supination    Grip strength (lbs)    (Blank rows = not tested) not tested secondary to recent surgery      PALPATION:  No unexpected TTP    TODAY'S TREATMENT:                                                                                                                                         DATE:  Exercises - Circular Shoulder Pendulum with Table Support  - 1 x daily - 7 x weekly - 3 sets - 10 reps - Seated Elbow Flexion AAROM  - 1 x daily - 7 x weekly - 3 sets - 10 reps - Seated Scapular Retraction  - 1 x daily - 7 x weekly - 3 sets - 10 reps  Manual: PROM into flexion and ER   PATIENT EDUCATION: Education details: HEP,  symptom management , improtance of the sling  Person educated: Patient Education method: Explanation, Demonstration, Tactile cues, Verbal cues, and Handouts Education comprehension: verbalized understanding, returned demonstration, verbal cues required, tactile cues required, and needs further education  HOME EXERCISE PROGRAM: Access Code: [email or web address] URL: [email or web address] Date: 10/31/2023 Prepared by: Lorayne Bender  ASSESSMENT:  CLINICAL IMPRESSION: Patient is a 52 year old male status post left rotator cuff repair on 10/24/2023.  At this time he presents with expected limitations in range of motion, strength, upper extremity function on his dominant side.  His pain is well-controlled.  He would benefit from skilled therapy to improve use of dominant shoulder and be able to return to work.  OBJECTIVE IMPAIRMENTS:  decreased ROM, decreased strength, impaired UE functional use, and pain.   ACTIVITY LIMITATIONS: carrying, lifting, sleeping, bathing, toileting, dressing, self feeding, reach over head, and hygiene/grooming  PARTICIPATION LIMITATIONS: meal prep, cleaning, laundry, driving, shopping, community activity, occupation, and yard work  PERSONAL FACTORS: 1 comorbidity: previous right RTC tear   are also affecting patient's functional outcome.   REHAB POTENTIAL: Good  CLINICAL DECISION MAKING: Stable/uncomplicated  EVALUATION COMPLEXITY: Low  GOALS: Goals reviewed with patient? Yes  SHORT TERM GOALS: Target date: 12/12/2023    Patient will increase passive flexion to 110 degrees on the left Baseline: Goal status: INITIAL  2.  Patient will increase passive external rotation 20 degrees on the left Baseline:  Goal status: INITIAL  3.  Patient will wean out of sling per MD Baseline:  Goal status: INITIAL  4.  Patient will be independent with a base exercise program Baseline:  Goal status: INITIAL  LONG TERM GOALS: Target date: 01/23/2024    Patient will reach behind his head without increased pain in order to perform daily tasks Baseline:  Goal status: INITIAL  2.  Patient will reach overhead with 2 pound weight in order to put dishes away Baseline:  Goal status: INITIAL  3.  Patient will return to work tasks when cleared by MD Baseline:  Goal status: INITIAL  PLAN: PT FREQUENCY: 1-2x/week  PT DURATION: 8 weeks  PLANNED INTERVENTIONS: 97110-Therapeutic exercises, 97530- Therapeutic activity, [id number]- Neuromuscular re-education, 97535- Self Care, 16109- Manual therapy, [id number]- Aquatic Therapy, 97014- Electrical stimulation (unattended), 97035- Ultrasound, Patient/Family education, Taping, Dry Needling, DME instructions, Cryotherapy, and Moist heat  PLAN FOR NEXT SESSION:   Begin per rtc protocol    Dessie Coma, PT 10/31/2023, 10:13 AM

## 2023-11-01 ENCOUNTER — Other Ambulatory Visit (HOSPITAL_BASED_OUTPATIENT_CLINIC_OR_DEPARTMENT_OTHER): Payer: Self-pay

## 2023-11-01 MED ORDER — OXYCODONE HCL 5 MG PO TABS
5.0000 mg | ORAL_TABLET | ORAL | 0 refills | Status: DC | PRN
  Filled 2023-11-01: qty 15, 3d supply, fill #0

## 2023-11-05 NOTE — Therapy (Signed)
OUTPATIENT PHYSICAL THERAPY UPPER EXTREMITY EVALUATION   Patient Name: Preston Davis MRN: [id number] DOB:[birth year], 52 y.o., male Today's Date: 11/07/2023  END OF SESSION:  PT End of Session - 11/06/23 1207     Visit Number 2    Number of Visits 24    Date for PT Re-Evaluation 01/23/24    PT Start Time 1115    PT Stop Time 1155    PT Time Calculation (min) 40 min    Activity Tolerance Patient tolerated treatment well    Behavior During Therapy [facility] for tasks assessed/performed             Past Medical History:  Diagnosis Date   Acid reflux    C. difficile colitis    DVT (deep venous thrombosis) (HCC) 2014   left leg   Factor 5 Leiden mutation, heterozygous (HCC)    Heart murmur    High blood cholesterol level    Hypertension    Right shoulder pain    Past Surgical History:  Procedure Laterality Date   AORTIC ARCH ANGIOGRAPHY N/A 10/07/2022   Procedure: AORTIC ARCH ANGIOGRAPHY;  Surgeon: Leonie Douglas, MD;  [facility];  Service: Cardiovascular;  Laterality: N/A;   APPENDECTOMY     bone graft  right great toe     for bunion   PERIPHERAL VASCULAR INTERVENTION Left 10/07/2022   Procedure: PERIPHERAL VASCULAR INTERVENTION;  Surgeon: Leonie Douglas, MD;  [facility];  Service: Cardiovascular;  Laterality: Left;  Subclavian   SHOULDER ARTHROSCOPY WITH ROTATOR CUFF REPAIR Left 10/24/2023   Procedure: LEFT SHOULDER ARTHROSCOPY ROTATOR CUFF REPAIR;  Surgeon: Huel Cote, MD;  Location: [facility];  Service: Orthopedics;  Laterality: Left;   SHOULDER OPEN ROTATOR CUFF REPAIR Right 05/14/2013   Procedure: ROTATOR CUFF REPAIR SHOULDER OPEN;  Surgeon: Darreld Mclean, MD;  Location: [facility];  Service: Orthopedics;  Laterality: Right;   VASECTOMY     Patient Active Problem List   Diagnosis Date Noted   Nontraumatic complete tear of left rotator cuff 10/24/2023   HTN (hypertension) 12/16/2017   C. difficile colitis 12/15/2017     PCP: Elfredia Nevins, MD  REFERRING PROVIDER: Dr Huel Cote   REFERRING DIAG: LEFT SHOULDER COMPLETE ROTATOR CUFF TEAR   THERAPY DIAG:  Acute pain of left shoulder  Stiffness of left shoulder, not elsewhere classified  Muscle weakness (generalized)  Rationale for Evaluation and Treatment: Rehabilitation  ONSET DATE: DOS: 10/24/2023  SUBJECTIVE:  SUBJECTIVE STATEMENT: Pt is 1 week and 6 days s/p L shoulder rotator cuff repair, biceps tenodesis, subacromial decompression, and extensive debridement.  Pt woke up with his arm out of the sling and was hurting.  Pt states his shoulder was an 8/10 on the pain scale when he woke up.  His wife helped him put his arm back in the sling and he took pain medication.  He has noticed some lateral shoulder pain currently.  Pt denies pain currently.  He reports compliance with HEP and with sling usage.  Pt denies any adverse effects after prior Rx.  Pt states he sees MD on Wednesday.      Hand dominance: Left  PERTINENT HISTORY: Right rotator rotator cuff repair, factor V Leiden mutation Peripheral vascular intervention 11/3 HTN,  PAIN:  Are you having pain? Yes: NPRS scale: 0/10 Pain location: anterior and superior shoulder  Pain description: aching  Aggravating factors: moving thr arm  Relieving factors: rest   PRECAUTIONS: None  RED FLAGS: None   WEIGHT BEARING RESTRICTIONS: Yes NWB  FALLS:  Has patient fallen in last 6 months? Yes. Number of falls fell 1x furiture fell on you.  LIVING ENVIRONMENT: Nothing  OCCUPATION: Doctor, hospital   Hobbies:  Banker    PLOF: Independent  PATIENT GOALS:  To have less pain   NEXT MD VISIT:  Nothing scheduled   OBJECTIVE:  Note: Objective measures were completed at  Evaluation unless otherwise noted.  DIAGNOSTIC FINDINGS:  Nothing at this time    TODAY'S TREATMENT:                                                                                                                                         DATE:   Reviewed pt presentation, HEP compliance, pain level, and response to prior Rx.   PT changed pt's dressings.  PT removed tegaderm and gauze and applied new gauze and tegaderm over portals.  Pt had slight redness at lateral portal though nothing abnormal.  Portals intact with stitches.  Portals were dry and had no drainage.  Pt performed:  Pendulums 2x10 each cw and ccw  Wrist flex/extension AROM x 20 reps  Towel gripping 3x10   Pt received L shoulder PROM in flexion and ER in supine per pt and tissue tolerance w/n protocol ranges.      PATIENT EDUCATION: Education details: HEP, symptom management , importance of the sling, protocol and post op restrictions, dressings, proper sling fit, and exercise form.  Person educated: Patient Education method: Explanation, Demonstration, Tactile cues, Verbal cues, and Handouts Education comprehension: verbalized understanding, returned demonstration, verbal cues required, tactile cues required, and needs further education  HOME EXERCISE PROGRAM: Access Code: [email or web address] URL: [email or web address] Date: 10/31/2023 Prepared by: Lorayne Bender  Updated HEP: - Wrist AROM Flexion Extension  (In sling)  - 3 x daily - 7 x weekly -  3 sets - 10 reps   ASSESSMENT:  CLINICAL IMPRESSION: Patient reports compliance with HEP and sling usage.  Pt performed exercises per protocol well with cuing and instruction in correct form.  PT changed pt's dressings today.  PT performed PROM per pt and tissue tolerance w/n protocol ranges.  He has expected limitations in shoulder PROM at this time.  He did require cuing to decrease guarding with PROM.  PT updated HEP with wrist AROM and gave pt a HEP handout.  He  responded well to Rx having no pain after Rx.  He should benefit from cont skilled PT services per protocol to address impairments and goals and improve function.     OBJECTIVE IMPAIRMENTS: decreased ROM, decreased strength, impaired UE functional use, and pain.   ACTIVITY LIMITATIONS: carrying, lifting, sleeping, bathing, toileting, dressing, self feeding, reach over head, and hygiene/grooming  PARTICIPATION LIMITATIONS: meal prep, cleaning, laundry, driving, shopping, community activity, occupation, and yard work  PERSONAL FACTORS: 1 comorbidity: previous right RTC tear   are also affecting patient's functional outcome.   REHAB POTENTIAL: Good  CLINICAL DECISION MAKING: Stable/uncomplicated  EVALUATION COMPLEXITY: Low  GOALS: Goals reviewed with patient? Yes  SHORT TERM GOALS: Target date: 12/12/2023    Patient will increase passive flexion to 110 degrees on the left Baseline: Goal status: INITIAL  2.  Patient will increase passive external rotation 20 degrees on the left Baseline:  Goal status: INITIAL  3.  Patient will wean out of sling per MD Baseline:  Goal status: INITIAL  4.  Patient will be independent with a base exercise program Baseline:  Goal status: INITIAL  LONG TERM GOALS: Target date: 01/23/2024    Patient will reach behind his head without increased pain in order to perform daily tasks Baseline:  Goal status: INITIAL  2.  Patient will reach overhead with 2 pound weight in order to put dishes away Baseline:  Goal status: INITIAL  3.  Patient will return to work tasks when cleared by MD Baseline:  Goal status: INITIAL  PLAN: PT FREQUENCY: 1-2x/week  PT DURATION: 8 weeks  PLANNED INTERVENTIONS: 97110-Therapeutic exercises, 97530- Therapeutic activity, [id number]- Neuromuscular re-education, 97535- Self Care, 60454- Manual therapy, [id number]- Aquatic Therapy, 97014- Electrical stimulation (unattended), 97035- Ultrasound, Patient/Family education, Taping,  Dry Needling, DME instructions, Cryotherapy, and Moist heat   PLAN FOR NEXT SESSION:  Cont per Dr. Serena Croissant rotator cuff repair protocol with regard to biceps tenodesis.     Audie Clear III PT, DPT 11/07/23 10:07 AM

## 2023-11-06 ENCOUNTER — Ambulatory Visit (HOSPITAL_BASED_OUTPATIENT_CLINIC_OR_DEPARTMENT_OTHER): Payer: BC Managed Care – PPO | Attending: Orthopaedic Surgery | Admitting: Physical Therapy

## 2023-11-06 ENCOUNTER — Encounter (HOSPITAL_BASED_OUTPATIENT_CLINIC_OR_DEPARTMENT_OTHER): Payer: BC Managed Care – PPO | Admitting: Orthopaedic Surgery

## 2023-11-06 DIAGNOSIS — M25612 Stiffness of left shoulder, not elsewhere classified: Secondary | ICD-10-CM | POA: Diagnosis not present

## 2023-11-06 DIAGNOSIS — M25512 Pain in left shoulder: Secondary | ICD-10-CM | POA: Insufficient documentation

## 2023-11-06 DIAGNOSIS — M6281 Muscle weakness (generalized): Secondary | ICD-10-CM | POA: Diagnosis not present

## 2023-11-06 NOTE — Progress Notes (Unsigned)
[facility] [place]  ASSESSMENT / PLAN: Preston Davis is a 52 y.o. male status post left subclavian artery stenting 10/07/22 for disabling claudication. Good technical result achieved.  Recommend the following which can slow the progression of atherosclerosis and reduce the risk of major adverse cardiac / limb events:  Complete cessation from all tobacco products. Blood glucose control with goal A1c < 7%. Blood pressure control with goal blood pressure < 140/90 mmHg. Lipid reduction therapy with goal LDL-C <100 mg/dL (<65 if symptomatic from PAD).  Aspirin 81mg  PO QD.  Plavix 75mg  PO QD x 12 months total (stop date 10/08/23) Atorvastatin 40-80mg  PO QD (or other "high intensity" statin therapy).  Left arm pain much better. Palpable pulse in the wrist. Having some pain in the left shoulder with overhead movement. Will refer to Dr. Steward Drone.   CHIEF COMPLAINT: Left arm pain  HISTORY OF PRESENT ILLNESS: Preston Davis is a 52 y.o. male who presents to clinic for evaluation of left arm pain with overhead activity.  The patient has had an extensive work-up by his primary care physician, which has identified severe left subclavian artery stenosis.  The patient reports a fairly classic history of upper extremity claudication type symptoms.  He has easy fatigability, and pain with overhead and strenuous activity left upper extremity.  [name], but does write with his right hand.  We had a long discussion about the risk, benefits, and alternatives of an intervention.   09/06/22: Patient returns to clinic to clinic.  He would like to proceed with stenting as he does not feel his symptoms are getting any better.  11/01/22: Returns for follow-up evaluation after left subclavian artery stenting.  Left arm pain much improved.  He does have some shoulder discomfort with overhead activity and heavy lifting.  He has a history of rotator cuff issues on the  right side.  03/07/23: Patient returns to clinic for evaluation.  He has developed radiating discomfort down his left arm.  He notices this when his arm is abducted.  He does not describe pain in the hand or wrist.  He does not report effort induced cramping discomfort.  11/07/23: ***  Past Medical History:  Diagnosis Date   Acid reflux    C. difficile colitis    DVT (deep venous thrombosis) (HCC) 2014   left leg   Factor 5 Leiden mutation, heterozygous (HCC)    Heart murmur    High blood cholesterol level    Hypertension    Right shoulder pain     Past Surgical History:  Procedure Laterality Date   AORTIC ARCH ANGIOGRAPHY N/A 10/07/2022   Procedure: AORTIC ARCH ANGIOGRAPHY;  Surgeon: Leonie Douglas, MD;  [facility];  Service: Cardiovascular;  Laterality: N/A;   APPENDECTOMY     bone graft  right great toe     for bunion   PERIPHERAL VASCULAR INTERVENTION Left 10/07/2022   Procedure: PERIPHERAL VASCULAR INTERVENTION;  Surgeon: Leonie Douglas, MD;  [facility];  Service: Cardiovascular;  Laterality: Left;  Subclavian   SHOULDER ARTHROSCOPY WITH ROTATOR CUFF REPAIR Left 10/24/2023   Procedure: LEFT SHOULDER ARTHROSCOPY ROTATOR CUFF REPAIR;  Surgeon: Huel Cote, MD;  Location: [facility];  Service: Orthopedics;  Laterality: Left;   SHOULDER OPEN ROTATOR CUFF REPAIR Right 05/14/2013   Procedure: ROTATOR CUFF REPAIR SHOULDER OPEN;  Surgeon: Darreld Mclean, MD;  Location: [facility];  Service: Orthopedics;  Laterality:  Right;   VASECTOMY      Family History  Problem Relation Age of Onset   Clotting disorder Mother    Diabetes Paternal Grandmother     Social History   Socioeconomic History   Marital status: Married    Spouse name: Not on file   Number of children: Not on file   Years of education: Not on file   Highest education level: Not on file  Occupational History   Not on file  Tobacco Use   Smoking status: Former     Current packs/day: 0.00    Types: Cigarettes    Quit date: 06/03/2013    Years since quitting: 10.4    Passive exposure: Never   Smokeless tobacco: Never  Vaping Use   Vaping status: Never Used  Substance and Sexual Activity   Alcohol use: No   Drug use: No   Sexual activity: Yes    Birth control/protection: Other-see comments    Comment: had vasectomy  Other Topics Concern   Not on file  Social History Narrative   Not on file   Social Determinants of Health   Financial Resource Strain: Not on file  Food Insecurity: Not on file  Transportation Needs: Not on file  Physical Activity: Not on file  Stress: Not on file  Social Connections: Not on file  Intimate Partner Violence: Not on file    Allergies  Allergen Reactions   Benicar [Olmesartan] Other (See Comments)    REACTION: Unable to regulate blood pressure with this medication--caused BP levels to drop too low.    Current Outpatient Medications  Medication Sig Dispense Refill   ascorbic acid (VITAMIN C) 500 MG tablet Take 500 mg by mouth daily.     aspirin EC 81 MG tablet Take 81 mg by mouth daily. Swallow whole.     atenolol (TENORMIN) 25 MG tablet Take 25 mg by mouth daily.     atorvastatin (LIPITOR) 40 MG tablet TAKE 1 TABLET BY MOUTH EVERY DAY 30 tablet 0   Atorvastatin Calcium (ATORVALIQ) 20 MG/5ML SUSP Take by mouth.     clopidogrel (PLAVIX) 75 MG tablet TAKE 1 TABLET BY MOUTH EVERY DAY 90 tablet 3   Clopidogrel Bisulfate (PLAVIX PO) Take by mouth.     ezetimibe (ZETIA) 10 MG tablet Take 10 mg by mouth daily.     HYDROcodone-acetaminophen (NORCO) 10-325 MG tablet Take 1 tablet by mouth every 6 (six) hours.     oxyCODONE (ROXICODONE) 5 MG immediate release tablet Take 1 tablet (5 mg total) by mouth every 4 (four) hours as needed for severe pain (pain score 7-10) or breakthrough pain. 15 tablet 0   sildenafil (VIAGRA) 100 MG tablet Take 50-100 mg by mouth daily as needed for erectile dysfunction.     zolpidem  (AMBIEN) 10 MG tablet Take 10 mg by mouth at bedtime.     No current facility-administered medications for this visit.    PHYSICAL EXAM There were no vitals filed for this visit.   Well-appearing man in no acute distress Regular rate and rhythm Unlabored breathing 2+ right radial pulse. 2+ left brachial and radial pulse.  PERTINENT LABORATORY AND RADIOLOGIC DATA  Most recent CBC    Latest Ref Rng & Units 10/07/2022    5:58 AM 12/17/2017    6:12 AM 12/15/2017    1:12 PM  CBC  WBC 4.0 - 10.5 K/uL  6.8  9.9   Hemoglobin 13.0 - 17.0 g/dL 16.1  09.6  13.1  Hematocrit 39.0 - 52.0 % 43.0  39.0  41.7   Platelets 150 - 400 K/uL  207  218      Most recent CMP    Latest Ref Rng & Units 10/07/2022    5:58 AM 12/17/2017    6:12 AM 12/16/2017    6:19 AM  CMP  Glucose 70 - 99 mg/dL 948  546  270   BUN 6 - 20 mg/dL 12  10  9    Creatinine 0.61 - 1.24 mg/dL 3.50  0.93  8.18   Sodium 135 - 145 mmol/L 142  139  138   Potassium 3.5 - 5.1 mmol/L 3.6  3.7  3.7   Chloride 98 - 111 mmol/L 107  105  107   CO2 22 - 32 mmol/L  25  21   Calcium 8.9 - 10.3 mg/dL  8.4  8.5    Duplex ultrasound of the left upper extremity shows no flow-limiting stenosis in the left upper extremity.  Triphasic flow throughout the visualized arteries.  Rande Brunt. Lenell Antu, MD FACS [facility] Phone Number: [phone] 11/06/2023 8:57 AM  Total time spent on preparing this encounter including chart review, data review, collecting history, examining the patient, coordinating care for this established patient, 20 minutes  Portions of this report may have been transcribed using voice recognition software.  Every effort has been made to ensure accuracy; however, inadvertent computerized transcription errors may still be present.

## 2023-11-07 ENCOUNTER — Ambulatory Visit: Payer: BC Managed Care – PPO | Admitting: Vascular Surgery

## 2023-11-07 ENCOUNTER — Ambulatory Visit (HOSPITAL_COMMUNITY)
Admission: RE | Admit: 2023-11-07 | Discharge: 2023-11-07 | Disposition: A | Discharge status: Home / Self Care | Payer: BC Managed Care – PPO | Source: Ambulatory Visit | Attending: Vascular Surgery | Admitting: Vascular Surgery

## 2023-11-07 ENCOUNTER — Encounter (HOSPITAL_BASED_OUTPATIENT_CLINIC_OR_DEPARTMENT_OTHER): Payer: Self-pay | Admitting: Physical Therapy

## 2023-11-07 ENCOUNTER — Encounter: Payer: Self-pay | Admitting: Vascular Surgery

## 2023-11-07 VITALS — BP 110/74 | HR 50 | Temp 98.1°F | Resp 20 | Ht 68.0 in | Wt 171.0 lb

## 2023-11-07 DIAGNOSIS — I771 Stricture of artery: Secondary | ICD-10-CM | POA: Insufficient documentation

## 2023-11-07 DIAGNOSIS — I739 Peripheral vascular disease, unspecified: Secondary | ICD-10-CM | POA: Diagnosis not present

## 2023-11-08 ENCOUNTER — Ambulatory Visit (HOSPITAL_BASED_OUTPATIENT_CLINIC_OR_DEPARTMENT_OTHER): Payer: BC Managed Care – PPO | Admitting: Orthopaedic Surgery

## 2023-11-08 DIAGNOSIS — M75122 Complete rotator cuff tear or rupture of left shoulder, not specified as traumatic: Secondary | ICD-10-CM

## 2023-11-08 NOTE — Progress Notes (Signed)
Post Operative Evaluation    Procedure/Date of Surgery: Left shoulder rotator cuff repair 11/19  Interval History:    Presents today 2 weeks status post above procedure.  Overall he is doing well.  He is still having some soreness radiating to the lateral aspect of the arm.  This is overall mild.  He has been working with physical therapy.  Denies any numbness or loss sensation in the hand   PMH/PSH/Family History/Social History/Meds/Allergies:    Past Medical History:  Diagnosis Date   Acid reflux    C. difficile colitis    DVT (deep venous thrombosis) (HCC) 2014   left leg   Factor 5 Leiden mutation, heterozygous (HCC)    Heart murmur    High blood cholesterol level    Hypertension    Right shoulder pain    Past Surgical History:  Procedure Laterality Date   AORTIC ARCH ANGIOGRAPHY N/A 10/07/2022   Procedure: AORTIC ARCH ANGIOGRAPHY;  Surgeon: Leonie Douglas, MD;  Location: [facility];  Service: Cardiovascular;  Laterality: N/A;   APPENDECTOMY     bone graft  right great toe     for bunion   PERIPHERAL VASCULAR INTERVENTION Left 10/07/2022   Procedure: PERIPHERAL VASCULAR INTERVENTION;  Surgeon: Leonie Douglas, MD;  Location: [facility];  Service: Cardiovascular;  Laterality: Left;  Subclavian   SHOULDER ARTHROSCOPY WITH ROTATOR CUFF REPAIR Left 10/24/2023   Procedure: LEFT SHOULDER ARTHROSCOPY ROTATOR CUFF REPAIR;  Surgeon: Huel Cote, MD;  [facility];  Service: Orthopedics;  Laterality: Left;   SHOULDER OPEN ROTATOR CUFF REPAIR Right 05/14/2013   Procedure: ROTATOR CUFF REPAIR SHOULDER OPEN;  Surgeon: Darreld Mclean, MD;  Location: [facility];  Service: Orthopedics;  Laterality: Right;   VASECTOMY     Social History   Socioeconomic History   Marital status: Married    Spouse name: Not on file   Number of children: Not on file   Years of education: Not on file   Highest education level:  Not on file  Occupational History   Not on file  Tobacco Use   Smoking status: Former    Current packs/day: 0.00    Types: Cigarettes    Quit date: 06/03/2013    Years since quitting: 10.4    Passive exposure: Never   Smokeless tobacco: Never  Vaping Use   Vaping status: Never Used  Substance and Sexual Activity   Alcohol use: No   Drug use: No   Sexual activity: Yes    Birth control/protection: Other-see comments    Comment: had vasectomy  Other Topics Concern   Not on file  Social History Narrative   Not on file   Social Determinants of Health   Financial Resource Strain: Not on file  Food Insecurity: Not on file  Transportation Needs: Not on file  Physical Activity: Not on file  Stress: Not on file  Social Connections: Not on file   Family History  Problem Relation Age of Onset   Clotting disorder Mother    Diabetes Paternal Grandmother    Allergies  Allergen Reactions   Benicar [Olmesartan] Other (See Comments)    REACTION: Unable to regulate blood pressure with this medication--caused BP levels to drop too low.   Current Outpatient Medications  Medication Sig Dispense Refill  ascorbic acid (VITAMIN C) 500 MG tablet Take 500 mg by mouth daily.     aspirin EC 81 MG tablet Take 81 mg by mouth daily. Swallow whole.     atenolol (TENORMIN) 25 MG tablet Take 25 mg by mouth daily.     atorvastatin (LIPITOR) 40 MG tablet TAKE 1 TABLET BY MOUTH EVERY DAY 30 tablet 0   Atorvastatin Calcium (ATORVALIQ) 20 MG/5ML SUSP Take by mouth.     clopidogrel (PLAVIX) 75 MG tablet TAKE 1 TABLET BY MOUTH EVERY DAY 90 tablet 3   Clopidogrel Bisulfate (PLAVIX PO) Take by mouth.     ezetimibe (ZETIA) 10 MG tablet Take 10 mg by mouth daily.     HYDROcodone-acetaminophen (NORCO) 10-325 MG tablet Take 1 tablet by mouth every 6 (six) hours.     oxyCODONE (ROXICODONE) 5 MG immediate release tablet Take 1 tablet (5 mg total) by mouth every 4 (four) hours as needed for severe pain (pain  score 7-10) or breakthrough pain. 15 tablet 0   sildenafil (VIAGRA) 100 MG tablet Take 50-100 mg by mouth daily as needed for erectile dysfunction.     zolpidem (AMBIEN) 10 MG tablet Take 10 mg by mouth at bedtime.     No current facility-administered medications for this visit.   VAS [place] UPPER EXTREMITY ARTERIAL DUPLEX  Result Date: 11/07/2023  UPPER EXTREMITY DUPLEX STUDY Patient Name:  Preston Davis  Date of Exam:   11/07/2023 Medical Rec #: [id number]      Accession #:    [id number] Date of Birth: [birth year]       Patient Gender: M Patient Age:   1 years Exam Location:  [facility] Procedure:      VAS [place] UPPER EXTREMITY ARTERIAL DUPLEX Referring Phys: Heath Lark --------------------------------------------------------------------------------  Indications: Left arm pain. History:     Patient has a history of subclavian stenosis and Left subclavian              artery stent placed 10/07/22.  Risk Factors:  Hypertension, hyperlipidemia. Left rotator cuff surgery                10/24/2023 Other Factors: Factor 5 Leiden Performing Technologist: Elita Quick RVT  Examination Guidelines: A complete evaluation includes B-mode imaging, spectral Doppler, color Doppler, and power Doppler as needed of all accessible portions of each vessel. Bilateral testing is considered an integral part of a complete examination. Limited examinations for reoccurring indications may be performed as noted.  Right Doppler Findings: +------------+----------+--------+--------+--------+ Site        PSV (cm/s)WaveformStenosisComments +------------+----------+--------+--------+--------+ Brachial Mid103       biphasic                 +------------+----------+--------+--------+--------+   Left Doppler Findings: +---------------+----------+---------+--------+--------+ Site           PSV (cm/s)Waveform StenosisComments +---------------+----------+---------+--------+--------+ Subclavian Prox134        triphasic                 +---------------+----------+---------+--------+--------+ Subclavian Mid 72        triphasic                 +---------------+----------+---------+--------+--------+ Subclavian Dist69        triphasic                 +---------------+----------+---------+--------+--------+ Axillary       74        triphasic                 +---------------+----------+---------+--------+--------+  Brachial Prox  78        triphasic                 +---------------+----------+---------+--------+--------+ Brachial Mid   58        triphasic                 +---------------+----------+---------+--------+--------+ Brachial Dist  58        triphasic                 +---------------+----------+---------+--------+--------+ Radial Prox    53        triphasic                 +---------------+----------+---------+--------+--------+ Radial Mid     63        triphasic                 +---------------+----------+---------+--------+--------+ Radial Dist    59        triphasic                 +---------------+----------+---------+--------+--------+ Ulnar Prox     61        triphasic                 +---------------+----------+---------+--------+--------+ Ulnar Mid      59        triphasic                 +---------------+----------+---------+--------+--------+ Ulnar Dist     52        triphasic                 +---------------+----------+---------+--------+--------+   Summary:  Left: No obstruction visualized in the left upper extremity. Left       subclavian stent has limited visualization, appears patent. *See table(s) above for measurements and observations. Electronically signed by Heath Lark on 11/07/2023 at 4:52:21 PM.    Final     Review of Systems:   A ROS was performed including pertinent positives and negatives as documented in the HPI.   Musculoskeletal Exam:    There were no vitals taken for this visit.  Left shoulder incisions  are well-appearing without erythema or drainage.  In the spine position he can actively forward flex to 90 degrees with 20 external rotation at side.  Internal rotation deferred today.  Distal neurosensory exam is intact  Imaging:      I personally reviewed and interpreted the radiographs.   Assessment:   2 weeks status post left shoulder arthroscopic rotator cuff repair overall doing very well.  At this time he will continue to follow the rotator cuff repair protocol.  I will plan to see him back in 4 weeks for reassessment  Plan :    -Return clinic 4 weeks for reassessment      I personally saw and evaluated the patient, and participated in the management and treatment plan.  Huel Cote, MD Attending Physician, Orthopedic Surgery  This document was dictated using Dragon voice recognition software. A reasonable attempt at proof reading has been made to minimize errors.

## 2023-11-09 ENCOUNTER — Other Ambulatory Visit: Payer: Self-pay

## 2023-11-09 DIAGNOSIS — I771 Stricture of artery: Secondary | ICD-10-CM

## 2023-11-14 ENCOUNTER — Ambulatory Visit (HOSPITAL_BASED_OUTPATIENT_CLINIC_OR_DEPARTMENT_OTHER): Payer: BC Managed Care – PPO | Admitting: Physical Therapy

## 2023-11-14 DIAGNOSIS — M25512 Pain in left shoulder: Secondary | ICD-10-CM

## 2023-11-14 DIAGNOSIS — M6281 Muscle weakness (generalized): Secondary | ICD-10-CM

## 2023-11-14 DIAGNOSIS — M25612 Stiffness of left shoulder, not elsewhere classified: Secondary | ICD-10-CM

## 2023-11-14 NOTE — Therapy (Signed)
OUTPATIENT PHYSICAL THERAPY UPPER EXTREMITY EVALUATION   Patient Name: Preston Davis MRN: [id number] DOB:12-12-1970, 52 y.o., male Today's Date: 11/14/2023  END OF SESSION:  PT End of Session - 11/14/23 1047     Visit Number 3    Number of Visits 24    Date for PT Re-Evaluation 01/23/24    PT Start Time 1015    PT Stop Time 1050   did not require full session   PT Time Calculation (min) 35 min    Activity Tolerance Patient tolerated treatment well    Behavior During Therapy WFL for tasks assessed/performed              Past Medical History:  Diagnosis Date   Acid reflux    C. difficile colitis    DVT (deep venous thrombosis) (HCC) 2014   left leg   Factor 5 Leiden mutation, heterozygous (HCC)    Heart murmur    High blood cholesterol level    Hypertension    Right shoulder pain    Past Surgical History:  Procedure Laterality Date   AORTIC ARCH ANGIOGRAPHY N/A 10/07/2022   Procedure: AORTIC ARCH ANGIOGRAPHY;  Surgeon: Leonie Douglas, MD;  Location: [facility];  Service: Cardiovascular;  Laterality: N/A;   APPENDECTOMY     bone graft  right great toe     for bunion   PERIPHERAL VASCULAR INTERVENTION Left 10/07/2022   Procedure: PERIPHERAL VASCULAR INTERVENTION;  Surgeon: Leonie Douglas, MD;  Location: [facility];  Service: Cardiovascular;  Laterality: Left;  Subclavian   SHOULDER ARTHROSCOPY WITH ROTATOR CUFF REPAIR Left 10/24/2023   Procedure: LEFT SHOULDER ARTHROSCOPY ROTATOR CUFF REPAIR;  Surgeon: Huel Cote, MD;  Location: [facility];  Service: Orthopedics;  Laterality: Left;   SHOULDER OPEN ROTATOR CUFF REPAIR Right 05/14/2013   Procedure: ROTATOR CUFF REPAIR SHOULDER OPEN;  Surgeon: Darreld Mclean, MD;  Location: [facility];  Service: Orthopedics;  Laterality: Right;   VASECTOMY     Patient Active Problem List   Diagnosis Date Noted   Nontraumatic complete tear of left rotator cuff 10/24/2023   HTN (hypertension) 12/16/2017    C. difficile colitis 12/15/2017    PCP: Elfredia Nevins, MD  REFERRING PROVIDER: Dr Huel Cote   REFERRING DIAG: LEFT SHOULDER COMPLETE ROTATOR CUFF TEAR   THERAPY DIAG:  Acute pain of left shoulder  Stiffness of left shoulder, not elsewhere classified  Muscle weakness (generalized)  Rationale for Evaluation and Treatment: Rehabilitation  ONSET DATE: DOS: 10/24/2023  SUBJECTIVE:  SUBJECTIVE STATEMENT:  Patient has had pain in his elbow. He reports it is a little better when he rests it outside the sling. He als has mild lateral shoulder pain    Hand dominance: Left  PERTINENT HISTORY: Right rotator rotator cuff repair, factor V Leiden mutation Peripheral vascular intervention 11/3 HTN,  PAIN:  Are you having pain? Yes: NPRS scale: 0/10 Pain location: anterior and superior shoulder  Pain description: aching  Aggravating factors: moving thr arm  Relieving factors: rest   PRECAUTIONS: None  RED FLAGS: None   WEIGHT BEARING RESTRICTIONS: Yes NWB  FALLS:  Has patient fallen in last 6 months? Yes. Number of falls fell 1x furiture fell on you.  LIVING ENVIRONMENT: Nothing  OCCUPATION: Doctor, hospital   Hobbies:  Banker    PLOF: Independent  PATIENT GOALS:  To have less pain   NEXT MD VISIT:  Nothing scheduled   OBJECTIVE:  Note: Objective measures were completed at Evaluation unless otherwise noted.  DIAGNOSTIC FINDINGS:  Nothing at this time    TODAY'S TREATMENT:                                                                                                                                         DATE:    12/10  Manual: trigger point release to upper trap. Triger point release to distal bicpe. Elbow extension stretch with  supination. PROM into flexion and ER   Wrist flexion/extension 2lb 2x15  Elbow supination/ pronation 1 lb   Pendulum x20 for warm up    Last visit:   Reviewed pt presentation, HEP compliance, pain level, and response to prior Rx.   PT changed pt's dressings.  PT removed tegaderm and gauze and applied new gauze and tegaderm over portals.  Pt had slight redness at lateral portal though nothing abnormal.  Portals intact with stitches.  Portals were dry and had no drainage.  Pt performed:  Pendulums 2x10 each cw and ccw  Wrist flex/extension AROM x 20 reps  Towel gripping 3x10   Pt received L shoulder PROM in flexion and ER in supine per pt and tissue tolerance w/n protocol ranges.      PATIENT EDUCATION: Education details: HEP, symptom management , importance of the sling, protocol and post op restrictions, dressings, proper sling fit, and exercise form.  Person educated: Patient Education method: Explanation, Demonstration, Tactile cues, Verbal cues, and Handouts Education comprehension: verbalized understanding, returned demonstration, verbal cues required, tactile cues required, and needs further education  HOME EXERCISE PROGRAM: Access Code: [email or web address] URL: [email or web address] Date: 10/31/2023 Prepared by: Lorayne Bender  Updated HEP: - Wrist AROM Flexion Extension  (In sling)  - 3 x daily - 7 x weekly - 3 sets - 10 reps   ASSESSMENT:  CLINICAL IMPRESSION: The patients C/o today was pain in his elbow. With manual therapy he had improvement in elbow  pain. He had tightness in his distal bicep. His range in his shoulder is progressing very well. He is consistently going past protocol recommendations without reaching an end feel. We will continue to progress as tolerated.  OBJECTIVE IMPAIRMENTS: decreased ROM, decreased strength, impaired UE functional use, and pain.   ACTIVITY LIMITATIONS: carrying, lifting, sleeping, bathing, toileting, dressing, self feeding,  reach over head, and hygiene/grooming  PARTICIPATION LIMITATIONS: meal prep, cleaning, laundry, driving, shopping, community activity, occupation, and yard work  PERSONAL FACTORS: 1 comorbidity: previous right RTC tear   are also affecting patient's functional outcome.   REHAB POTENTIAL: Good  CLINICAL DECISION MAKING: Stable/uncomplicated  EVALUATION COMPLEXITY: Low  GOALS: Goals reviewed with patient? Yes  SHORT TERM GOALS: Target date: 12/12/2023    Patient will increase passive flexion to 110 degrees on the left Baseline: Goal status: 105 12/10  2.  Patient will increase passive external rotation 20 degrees on the left Baseline:  Goal status:achieved 40 degrees without end feel 12/10  3.  Patient will wean out of sling per MD Baseline:  Goal status: 3 more weks 12/10   4.  Patient will be independent with a base exercise program Baseline:  Goal status: INITIAL  LONG TERM GOALS: Target date: 01/23/2024    Patient will reach behind his head without increased pain in order to perform daily tasks Baseline:  Goal status: INITIAL  2.  Patient will reach overhead with 2 pound weight in order to put dishes away Baseline:  Goal status: INITIAL  3.  Patient will return to work tasks when cleared by MD Baseline:  Goal status: INITIAL  PLAN: PT FREQUENCY: 1-2x/week  PT DURATION: 8 weeks  PLANNED INTERVENTIONS: 97110-Therapeutic exercises, 97530- Therapeutic activity, [id number]- Neuromuscular re-education, 97535- Self Care, 98119- Manual therapy, [id number]- Aquatic Therapy, 97014- Electrical stimulation (unattended), 97035- Ultrasound, Patient/Family education, Taping, Dry Needling, DME instructions, Cryotherapy, and Moist heat   PLAN FOR NEXT SESSION:  Cont per Dr. Serena Croissant rotator cuff repair protocol with regard to biceps tenodesis.     Audie Clear III PT, DPT 11/14/23 1:21 PM

## 2023-11-18 ENCOUNTER — Other Ambulatory Visit: Payer: Self-pay | Admitting: Vascular Surgery

## 2023-11-21 ENCOUNTER — Encounter (HOSPITAL_BASED_OUTPATIENT_CLINIC_OR_DEPARTMENT_OTHER): Payer: Self-pay

## 2023-11-21 ENCOUNTER — Ambulatory Visit (HOSPITAL_BASED_OUTPATIENT_CLINIC_OR_DEPARTMENT_OTHER): Payer: BC Managed Care – PPO

## 2023-11-21 DIAGNOSIS — M6281 Muscle weakness (generalized): Secondary | ICD-10-CM | POA: Diagnosis not present

## 2023-11-21 DIAGNOSIS — M25512 Pain in left shoulder: Secondary | ICD-10-CM | POA: Diagnosis not present

## 2023-11-21 DIAGNOSIS — M25612 Stiffness of left shoulder, not elsewhere classified: Secondary | ICD-10-CM

## 2023-11-21 NOTE — Therapy (Signed)
OUTPATIENT PHYSICAL THERAPY UPPER EXTREMITY TREATMENT   Patient Name: Preston Davis MRN: [id number] DOB:[birth year], 52 y.o., male Today's Date: 11/21/2023  END OF SESSION:  PT End of Session - 11/21/23 1014     Visit Number 4    Number of Visits 24    Date for PT Re-Evaluation 01/23/24    PT Start Time 1020    PT Stop Time 1054    PT Time Calculation (min) 34 min    Activity Tolerance Patient tolerated treatment well    Behavior During Therapy WFL for tasks assessed/performed               Past Medical History:  Diagnosis Date   Acid reflux    C. difficile colitis    DVT (deep venous thrombosis) (HCC) 2014   left leg   Factor 5 Leiden mutation, heterozygous (HCC)    Heart murmur    High blood cholesterol level    Hypertension    Right shoulder pain    Past Surgical History:  Procedure Laterality Date   AORTIC ARCH ANGIOGRAPHY N/A 10/07/2022   Procedure: AORTIC ARCH ANGIOGRAPHY;  Surgeon: Leonie Douglas, MD;  Location: [facility];  Service: Cardiovascular;  Laterality: N/A;   APPENDECTOMY     bone graft  right great toe     for bunion   PERIPHERAL VASCULAR INTERVENTION Left 10/07/2022   Procedure: PERIPHERAL VASCULAR INTERVENTION;  Surgeon: Leonie Douglas, MD;  Location: [facility];  Service: Cardiovascular;  Laterality: Left;  Subclavian   SHOULDER ARTHROSCOPY WITH ROTATOR CUFF REPAIR Left 10/24/2023   Procedure: LEFT SHOULDER ARTHROSCOPY ROTATOR CUFF REPAIR;  Surgeon: Huel Cote, MD;  Location: [facility];  Service: Orthopedics;  Laterality: Left;   SHOULDER OPEN ROTATOR CUFF REPAIR Right 05/14/2013   Procedure: ROTATOR CUFF REPAIR SHOULDER OPEN;  Surgeon: Darreld Mclean, MD;  Location: [facility];  Service: Orthopedics;  Laterality: Right;   VASECTOMY     Patient Active Problem List   Diagnosis Date Noted   Nontraumatic complete tear of left rotator cuff 10/24/2023   HTN (hypertension) 12/16/2017   C. difficile colitis  12/15/2017    PCP: Elfredia Nevins, MD  REFERRING PROVIDER: Dr Huel Cote   REFERRING DIAG: LEFT SHOULDER COMPLETE ROTATOR CUFF TEAR   THERAPY DIAG:  Acute pain of left shoulder  Stiffness of left shoulder, not elsewhere classified  Muscle weakness (generalized)  Rationale for Evaluation and Treatment: Rehabilitation  ONSET DATE: DOS: 10/24/2023  SUBJECTIVE:  SUBJECTIVE STATEMENT:  Patient reports improved elbow pain after last session. Continues to have intermittent pains in shoulder daily.    Hand dominance: Left  PERTINENT HISTORY: Right rotator rotator cuff repair, factor V Leiden mutation Peripheral vascular intervention 11/3 HTN,  PAIN:  Are you having pain? Yes: NPRS scale: 0/10 Pain location: anterior and superior shoulder  Pain description: aching  Aggravating factors: moving thr arm  Relieving factors: rest   PRECAUTIONS: None  RED FLAGS: None   WEIGHT BEARING RESTRICTIONS: Yes NWB  FALLS:  Has patient fallen in last 6 months? Yes. Number of falls fell 1x furiture fell on you.  LIVING ENVIRONMENT: Nothing  OCCUPATION: Doctor, hospital   Hobbies:  Banker    PLOF: Independent  PATIENT GOALS:  To have less pain   NEXT MD VISIT:  Nothing scheduled   OBJECTIVE:  Note: Objective measures were completed at Evaluation unless otherwise noted.  DIAGNOSTIC FINDINGS:  Nothing at this time    TODAY'S TREATMENT:                                                                                                                                         DATE:   12/17 PROM L shoulder Well arm assist flexion supine x10 Seated scap sqz x20 Table slides AAROM flexion and scaption x10ea Pendulums HEP update and review    12/10  Manual:  trigger point release to upper trap. Triger point release to distal bicpe. Elbow extension stretch with supination. PROM into flexion and ER   Wrist flexion/extension 2lb 2x15  Elbow supination/ pronation 1 lb   Pendulum x20 for warm up    Last visit:   Reviewed pt presentation, HEP compliance, pain level, and response to prior Rx.   PT changed pt's dressings.  PT removed tegaderm and gauze and applied new gauze and tegaderm over portals.  Pt had slight redness at lateral portal though nothing abnormal.  Portals intact with stitches.  Portals were dry and had no drainage.  Pt performed:  Pendulums 2x10 each cw and ccw  Wrist flex/extension AROM x 20 reps  Towel gripping 3x10   Pt received L shoulder PROM in flexion and ER in supine per pt and tissue tolerance w/n protocol ranges.      PATIENT EDUCATION: Education details: HEP, symptom management , importance of the sling, protocol and post op restrictions, dressings, proper sling fit, and exercise form.  Person educated: Patient Education method: Explanation, Demonstration, Tactile cues, Verbal cues, and Handouts Education comprehension: verbalized understanding, returned demonstration, verbal cues required, tactile cues required, and needs further education  HOME EXERCISE PROGRAM: Access Code: [email or web address] URL: [email or web address] Date: 10/31/2023 Prepared by: Lorayne Bender  Updated HEP: - Wrist AROM Flexion Extension  (In sling)  - 3 x daily - 7 x weekly - 3 sets - 10 reps   ASSESSMENT:  CLINICAL IMPRESSION: Educated  pt on proper protocol progressions and restrictions at this time. He was able to initiate AAROM activity today as he is now 4 weeks s/p. Instructed to avoid pushing past pain limits.   OBJECTIVE IMPAIRMENTS: decreased ROM, decreased strength, impaired UE functional use, and pain.   ACTIVITY LIMITATIONS: carrying, lifting, sleeping, bathing, toileting, dressing, self feeding, reach over head, and  hygiene/grooming  PARTICIPATION LIMITATIONS: meal prep, cleaning, laundry, driving, shopping, community activity, occupation, and yard work  PERSONAL FACTORS: 1 comorbidity: previous right RTC tear   are also affecting patient's functional outcome.   REHAB POTENTIAL: Good  CLINICAL DECISION MAKING: Stable/uncomplicated  EVALUATION COMPLEXITY: Low  GOALS: Goals reviewed with patient? Yes  SHORT TERM GOALS: Target date: 12/12/2023    Patient will increase passive flexion to 110 degrees on the left Baseline: Goal status: 105 12/10  2.  Patient will increase passive external rotation 20 degrees on the left Baseline:  Goal status:achieved 40 degrees without end feel 12/10  3.  Patient will wean out of sling per MD Baseline:  Goal status: 3 more weks 12/10   4.  Patient will be independent with a base exercise program Baseline:  Goal status: INITIAL  LONG TERM GOALS: Target date: 01/23/2024    Patient will reach behind his head without increased pain in order to perform daily tasks Baseline:  Goal status: INITIAL  2.  Patient will reach overhead with 2 pound weight in order to put dishes away Baseline:  Goal status: INITIAL  3.  Patient will return to work tasks when cleared by MD Baseline:  Goal status: INITIAL  PLAN: PT FREQUENCY: 1-2x/week  PT DURATION: 8 weeks  PLANNED INTERVENTIONS: 97110-Therapeutic exercises, 97530- Therapeutic activity, [id number]- Neuromuscular re-education, 97535- Self Care, 21308- Manual therapy, [id number]- Aquatic Therapy, 97014- Electrical stimulation (unattended), 97035- Ultrasound, Patient/Family education, Taping, Dry Needling, DME instructions, Cryotherapy, and Moist heat   PLAN FOR NEXT SESSION:  Cont per Dr. Serena Croissant rotator cuff repair protocol with regard to biceps tenodesis.     Riki Altes, PTA  11/21/23 11:06 AM

## 2023-11-28 ENCOUNTER — Encounter (HOSPITAL_BASED_OUTPATIENT_CLINIC_OR_DEPARTMENT_OTHER): Payer: BC Managed Care – PPO | Admitting: Physical Therapy

## 2023-12-01 ENCOUNTER — Encounter (HOSPITAL_BASED_OUTPATIENT_CLINIC_OR_DEPARTMENT_OTHER): Payer: BC Managed Care – PPO | Admitting: Physical Therapy

## 2023-12-05 ENCOUNTER — Encounter (HOSPITAL_BASED_OUTPATIENT_CLINIC_OR_DEPARTMENT_OTHER): Payer: BC Managed Care – PPO

## 2023-12-07 NOTE — Therapy (Signed)
 OUTPATIENT PHYSICAL THERAPY UPPER EXTREMITY TREATMENT   Patient Name: Preston Davis MRN: [id number] DOB:Sep 09, 1971, 53 y.o., male Today's Date: 12/08/2023  END OF SESSION:  PT End of Session - 12/08/23 1024     Visit Number 5    Number of Visits 24    Date for PT Re-Evaluation 01/23/24    Authorization Type BCBS    PT Start Time 1022    PT Stop Time 1107    PT Time Calculation (min) 45 min    Activity Tolerance Patient tolerated treatment well    Behavior During Therapy WFL for tasks assessed/performed                Past Medical History:  Diagnosis Date   Acid reflux    C. difficile colitis    DVT (deep venous thrombosis) (HCC) 2014   left leg   Factor 5 Leiden mutation, heterozygous (HCC)    Heart murmur    High blood cholesterol level    Hypertension    Right shoulder pain    Past Surgical History:  Procedure Laterality Date   AORTIC ARCH ANGIOGRAPHY N/A 10/07/2022   Procedure: AORTIC ARCH ANGIOGRAPHY;  Surgeon: Magda Debby SAILOR, MD;  Location: [facility];  Service: Cardiovascular;  Laterality: N/A;   APPENDECTOMY     bone graft  right great toe     for bunion   PERIPHERAL VASCULAR INTERVENTION Left 10/07/2022   Procedure: PERIPHERAL VASCULAR INTERVENTION;  Surgeon: Magda Debby SAILOR, MD;  Location: [facility];  Service: Cardiovascular;  Laterality: Left;  Subclavian   SHOULDER ARTHROSCOPY WITH ROTATOR CUFF REPAIR Left 10/24/2023   Procedure: LEFT SHOULDER ARTHROSCOPY ROTATOR CUFF REPAIR;  Surgeon: Genelle Standing, MD;  Location: [facility];  Service: Orthopedics;  Laterality: Left;   SHOULDER OPEN ROTATOR CUFF REPAIR Right 05/14/2013   Procedure: ROTATOR CUFF REPAIR SHOULDER OPEN;  Surgeon: Lemond Stable, MD;  Location: [facility];  Service: Orthopedics;  Laterality: Right;   VASECTOMY     Patient Active Problem List   Diagnosis Date Noted   Nontraumatic complete tear of left rotator cuff 10/24/2023   HTN (hypertension) 12/16/2017    C. difficile colitis 12/15/2017    PCP: Jerilynn Carnes, MD  REFERRING PROVIDER: Dr Standing Genelle   REFERRING DIAG: LEFT SHOULDER COMPLETE ROTATOR CUFF TEAR   THERAPY DIAG:  Acute pain of left shoulder  Stiffness of left shoulder, not elsewhere classified  Muscle weakness (generalized)  Rationale for Evaluation and Treatment: Rehabilitation  ONSET DATE: DOS: 10/24/2023  SUBJECTIVE:  SUBJECTIVE STATEMENT:  Pt is 6 weeks and 3 days s/p L shoulder rotator cuff repair, biceps tenodesis, subacromial decompression, and extensive debridement.  I'm hurting.  Pt reports he has a 2-3/10 pain currently though his pain can be a 7-8/10 at times.  Pt has been out of town.  Pt denies any adverse effects after prior Rx.  Pt reports compliance with HEP.     Hand dominance: Left  PERTINENT HISTORY: Right rotator rotator cuff repair, factor V Leiden mutation Peripheral vascular intervention 11/3 HTN,  PAIN:  Are you having pain? Yes: NPRS scale: 2-3/10 Pain location: anterior and superior shoulder  Pain description: aching  Aggravating factors: moving thr arm  Relieving factors: rest   PRECAUTIONS: None  RED FLAGS: None   WEIGHT BEARING RESTRICTIONS: Yes NWB  FALLS:  Has patient fallen in last 6 months? Yes. Number of falls fell 1x furiture fell on you.  LIVING ENVIRONMENT: Nothing  OCCUPATION: Doctor, hospital   Hobbies:  Banker    PLOF: Independent  PATIENT GOALS:  To have less pain   NEXT MD VISIT:  Nothing scheduled   OBJECTIVE:  Note: Objective measures were completed at Evaluation unless otherwise noted.  DIAGNOSTIC FINDINGS:  Nothing at this time    TODAY'S TREATMENT:                                                                                                                                          DATE:   1/3 Reviewed Pt presentation, HEP compliance, pain level, and response to prior Rx.  Seated scap retraction 2x10 Seated table slides in flexion 2x10 Supine well arm assisted flexion x 10 reps Supine wand flexion x 10 reps Supine wand ER 2x10 reps  Pt received L GH gentle joint distraction with oscillation and Grade I-II AP and inf jt mobs f/b L shoulder flexion, scaption, ER, and IR PROM per pt and tissue tolerance w/n protocol ranges  Updated HEP with supine wand ER and gave pt a handout.  PT instructed pt in correct form and appropriate frequency.  PT instructed pt to not perform into a painful or tight range.    12/17 PROM L shoulder Well arm assist flexion supine x10 Seated scap sqz x20 Table slides AAROM flexion and scaption x10ea Pendulums HEP update and review    12/10  Manual: trigger point release to upper trap. Triger point release to distal bicpe. Elbow extension stretch with supination. PROM into flexion and ER   Wrist flexion/extension 2lb 2x15  Elbow supination/ pronation 1 lb   Pendulum x20 for warm up    Last visit:   Reviewed pt presentation, HEP compliance, pain level, and response to prior Rx.   PT changed pt's dressings.  PT removed tegaderm and gauze and applied new gauze and tegaderm over portals.  Pt had slight redness at lateral portal though  nothing abnormal.  Portals intact with stitches.  Portals were dry and had no drainage.  Pt performed:  Pendulums 2x10 each cw and ccw  Wrist flex/extension AROM x 20 reps  Towel gripping 3x10   Pt received L shoulder PROM in flexion and ER in supine per pt and tissue tolerance w/n protocol ranges.      PATIENT EDUCATION: Education details: HEP, symptom management , importance of the sling, protocol and post op restrictions, dressings, proper sling fit, and exercise form.  Person educated: Patient Education method: Explanation,  Demonstration, Tactile cues, Verbal cues, and Handouts Education comprehension: verbalized understanding, returned demonstration, verbal cues required, tactile cues required, and needs further education  HOME EXERCISE PROGRAM: Access Code: [email or web address] URL: [email or web address] Date: 10/31/2023 Prepared by: Alm Don  Updated HEP:   Supine wand ER  2x10, 2x/day    ASSESSMENT:  CLINICAL IMPRESSION: Pt has been absent from PT for approx 2 weeks due to being out of town.  Pt states he has been hurting.  Pt performed AAROM exercises per protocol with instruction and cuing for correct form.  PT instructed pt to not perform AAROM exercises into a painful or tight range.  Pt had some pain with supine wand flexion and PT instructed pt to continue with supine well arm assisted flexion at home.  Pt performed supine wand ER without c/o's of pain and PT added that exercise to HEP.  Pt demonstrates good form with supine wand ER and good understanding.  He is progressing with shoulder ROM.  PT performed PROM per pt and tissue tolerance per protocol ranges.  Pt required cuing to decrease guarding with PROM.  Pt had tightness in flexion PROM which improved with increased reps of PROM.  He responded well to Rx having no increased pain after Rx.  OBJECTIVE IMPAIRMENTS: decreased ROM, decreased strength, impaired UE functional use, and pain.   ACTIVITY LIMITATIONS: carrying, lifting, sleeping, bathing, toileting, dressing, self feeding, reach over head, and hygiene/grooming  PARTICIPATION LIMITATIONS: meal prep, cleaning, laundry, driving, shopping, community activity, occupation, and yard work  PERSONAL FACTORS: 1 comorbidity: previous right RTC tear   are also affecting patient's functional outcome.   REHAB POTENTIAL: Good  CLINICAL DECISION MAKING: Stable/uncomplicated  EVALUATION COMPLEXITY: Low  GOALS: Goals reviewed with patient? Yes  SHORT TERM GOALS: Target date:  12/12/2023    Patient will increase passive flexion to 110 degrees on the left Baseline: Goal status: 105 12/10  2.  Patient will increase passive external rotation 20 degrees on the left Baseline:  Goal status:achieved 40 degrees without end feel 12/10  3.  Patient will wean out of sling per MD Baseline:  Goal status: 3 more weks 12/10   4.  Patient will be independent with a base exercise program Baseline:  Goal status: INITIAL  LONG TERM GOALS: Target date: 01/23/2024    Patient will reach behind his head without increased pain in order to perform daily tasks Baseline:  Goal status: INITIAL  2.  Patient will reach overhead with 2 pound weight in order to put dishes away Baseline:  Goal status: INITIAL  3.  Patient will return to work tasks when cleared by MD Baseline:  Goal status: INITIAL  PLAN: PT FREQUENCY: 1-2x/week  PT DURATION: 8 weeks  PLANNED INTERVENTIONS: 97110-Therapeutic exercises, 97530- Therapeutic activity, [id number]- Neuromuscular re-education, 97535- Self Care, [id number]- Manual therapy, [id number]- Aquatic Therapy, 97014- Electrical stimulation (unattended), 97035- Ultrasound, Patient/Family education, Taping, Dry Needling, DME instructions, Cryotherapy, and Moist heat   PLAN FOR  NEXT SESSION:  Cont per Dr. Danetta rotator cuff repair protocol with regard to biceps tenodesis.  PT sent message to MD concerning his sling.  MD responded to PT stating he can remove the sling.  PT will call pt concerning MD message.    Leigh Minerva III PT, DPT 12/08/23 10:28 PM

## 2023-12-08 ENCOUNTER — Ambulatory Visit (HOSPITAL_BASED_OUTPATIENT_CLINIC_OR_DEPARTMENT_OTHER): Payer: BC Managed Care – PPO | Attending: Orthopaedic Surgery | Admitting: Physical Therapy

## 2023-12-08 ENCOUNTER — Encounter (HOSPITAL_BASED_OUTPATIENT_CLINIC_OR_DEPARTMENT_OTHER): Payer: Self-pay | Admitting: Physical Therapy

## 2023-12-08 DIAGNOSIS — M25612 Stiffness of left shoulder, not elsewhere classified: Secondary | ICD-10-CM | POA: Diagnosis not present

## 2023-12-08 DIAGNOSIS — M25512 Pain in left shoulder: Secondary | ICD-10-CM | POA: Diagnosis not present

## 2023-12-08 DIAGNOSIS — M6281 Muscle weakness (generalized): Secondary | ICD-10-CM | POA: Insufficient documentation

## 2023-12-11 DIAGNOSIS — Z6827 Body mass index (BMI) 27.0-27.9, adult: Secondary | ICD-10-CM | POA: Diagnosis not present

## 2023-12-11 DIAGNOSIS — M1991 Primary osteoarthritis, unspecified site: Secondary | ICD-10-CM | POA: Diagnosis not present

## 2023-12-11 DIAGNOSIS — N529 Male erectile dysfunction, unspecified: Secondary | ICD-10-CM | POA: Diagnosis not present

## 2023-12-11 DIAGNOSIS — E663 Overweight: Secondary | ICD-10-CM | POA: Diagnosis not present

## 2023-12-11 DIAGNOSIS — F5101 Primary insomnia: Secondary | ICD-10-CM | POA: Diagnosis not present

## 2023-12-11 DIAGNOSIS — I10 Essential (primary) hypertension: Secondary | ICD-10-CM | POA: Diagnosis not present

## 2023-12-11 DIAGNOSIS — I739 Peripheral vascular disease, unspecified: Secondary | ICD-10-CM | POA: Diagnosis not present

## 2023-12-11 DIAGNOSIS — M47816 Spondylosis without myelopathy or radiculopathy, lumbar region: Secondary | ICD-10-CM | POA: Diagnosis not present

## 2023-12-11 DIAGNOSIS — G894 Chronic pain syndrome: Secondary | ICD-10-CM | POA: Diagnosis not present

## 2023-12-13 ENCOUNTER — Ambulatory Visit (HOSPITAL_BASED_OUTPATIENT_CLINIC_OR_DEPARTMENT_OTHER): Payer: BC Managed Care – PPO | Admitting: Orthopaedic Surgery

## 2023-12-13 ENCOUNTER — Ambulatory Visit (HOSPITAL_BASED_OUTPATIENT_CLINIC_OR_DEPARTMENT_OTHER): Payer: BC Managed Care – PPO | Admitting: Physical Therapy

## 2023-12-13 DIAGNOSIS — M75122 Complete rotator cuff tear or rupture of left shoulder, not specified as traumatic: Secondary | ICD-10-CM

## 2023-12-13 NOTE — Progress Notes (Signed)
 Post Operative Evaluation    Procedure/Date of Surgery: Left shoulder rotator cuff repair 11/19  Interval History:    Presents today 6 weeks status post above procedure.  Overall he is continuing to improve.  Range of motion has improved with physical therapy  PMH/PSH/Family History/Social History/Meds/Allergies:    Past Medical History:  Diagnosis Date   Acid reflux    C. difficile colitis    DVT (deep venous thrombosis) (HCC) 2014   left leg   Factor 5 Leiden mutation, heterozygous (HCC)    Heart murmur    High blood cholesterol level    Hypertension    Right shoulder pain    Past Surgical History:  Procedure Laterality Date   AORTIC ARCH ANGIOGRAPHY N/A 10/07/2022   Procedure: AORTIC ARCH ANGIOGRAPHY;  Surgeon: Magda Debby SAILOR, MD;  Location: [facility];  Service: Cardiovascular;  Laterality: N/A;   APPENDECTOMY     bone graft  right great toe     for bunion   PERIPHERAL VASCULAR INTERVENTION Left 10/07/2022   Procedure: PERIPHERAL VASCULAR INTERVENTION;  Surgeon: Magda Debby SAILOR, MD;  Location: [facility];  Service: Cardiovascular;  Laterality: Left;  Subclavian   SHOULDER ARTHROSCOPY WITH ROTATOR CUFF REPAIR Left 10/24/2023   Procedure: LEFT SHOULDER ARTHROSCOPY ROTATOR CUFF REPAIR;  Surgeon: Genelle Standing, MD;  Location: [facility];  Service: Orthopedics;  Laterality: Left;   SHOULDER OPEN ROTATOR CUFF REPAIR Right 05/14/2013   Procedure: ROTATOR CUFF REPAIR SHOULDER OPEN;  Surgeon: Lemond Stable, MD;  Location: [facility];  Service: Orthopedics;  Laterality: Right;   VASECTOMY     Social History   Socioeconomic History   Marital status: Married    Spouse name: Not on file   Number of children: Not on file   Years of education: Not on file   Highest education level: Not on file  Occupational History   Not on file  Tobacco Use   Smoking status: Former    Current packs/day: 0.00    Types:  Cigarettes    Quit date: 06/03/2013    Years since quitting: 10.5    Passive exposure: Never   Smokeless tobacco: Never  Vaping Use   Vaping status: Never Used  Substance and Sexual Activity   Alcohol use: No   Drug use: No   Sexual activity: Yes    Birth control/protection: Other-see comments    Comment: had vasectomy  Other Topics Concern   Not on file  Social History Narrative   Not on file   Social Drivers of Health   Financial Resource Strain: Not on file  Food Insecurity: Not on file  Transportation Needs: Not on file  Physical Activity: Not on file  Stress: Not on file  Social Connections: Not on file   Family History  Problem Relation Age of Onset   Clotting disorder Mother    Diabetes Paternal Grandmother    Allergies  Allergen Reactions   Benicar [Olmesartan] Other (See Comments)    REACTION: Unable to regulate blood pressure with this medication--caused BP levels to drop too low.   Current Outpatient Medications  Medication Sig Dispense Refill   ascorbic acid (VITAMIN C) 500 MG tablet Take 500 mg by mouth daily.     aspirin  EC 81 MG tablet Take 81 mg by mouth  daily. Swallow whole.     atenolol (TENORMIN) 25 MG tablet Take 25 mg by mouth daily.     atorvastatin  (LIPITOR) 40 MG tablet TAKE 1 TABLET BY MOUTH EVERY DAY 90 tablet 0   Atorvastatin  Calcium  (ATORVALIQ) 20 MG/5ML SUSP Take by mouth.     clopidogrel  (PLAVIX ) 75 MG tablet TAKE 1 TABLET BY MOUTH EVERY DAY 90 tablet 3   Clopidogrel  Bisulfate (PLAVIX  PO) Take by mouth.     ezetimibe (ZETIA) 10 MG tablet Take 10 mg by mouth daily.     HYDROcodone -acetaminophen  (NORCO) 10-325 MG tablet Take 1 tablet by mouth every 6 (six) hours.     oxyCODONE  (ROXICODONE ) 5 MG immediate release tablet Take 1 tablet (5 mg total) by mouth every 4 (four) hours as needed for severe pain (pain score 7-10) or breakthrough pain. 15 tablet 0   sildenafil (VIAGRA) 100 MG tablet Take 50-100 mg by mouth daily as needed for erectile  dysfunction.     zolpidem  (AMBIEN ) 10 MG tablet Take 10 mg by mouth at bedtime.     No current facility-administered medications for this visit.   No results found.  Review of Systems:   A ROS was performed including pertinent positives and negatives as documented in the HPI.   Musculoskeletal Exam:    There were no vitals taken for this visit.  Left shoulder incisions are well-appearing without erythema or drainage.  In the spine position he can actively forward flex to 120 degrees with 45 external rotation at side.  Internal rotation deferred today.  Distal neurosensory exam is intact  Imaging:      I personally reviewed and interpreted the radiographs.   Assessment:   6 weeks status post left shoulder arthroscopic rotator cuff repair overall doing very well.  At this point he will continue active overhead range of motion.  I will plan to see him back in 6 weeks for reassessment  Plan :    -Return clinic 6 weeks for reassessment      I personally saw and evaluated the patient, and participated in the management and treatment plan.  Elspeth Parker, MD Attending Physician, Orthopedic Surgery  This document was dictated using Dragon voice recognition software. A reasonable attempt at proof reading has been made to minimize errors.

## 2023-12-18 ENCOUNTER — Ambulatory Visit (HOSPITAL_BASED_OUTPATIENT_CLINIC_OR_DEPARTMENT_OTHER): Payer: BC Managed Care – PPO | Admitting: Physical Therapy

## 2023-12-22 ENCOUNTER — Ambulatory Visit (HOSPITAL_BASED_OUTPATIENT_CLINIC_OR_DEPARTMENT_OTHER): Payer: BC Managed Care – PPO | Admitting: Physical Therapy

## 2023-12-22 ENCOUNTER — Other Ambulatory Visit: Payer: Self-pay | Admitting: Vascular Surgery

## 2023-12-26 ENCOUNTER — Ambulatory Visit (HOSPITAL_BASED_OUTPATIENT_CLINIC_OR_DEPARTMENT_OTHER): Payer: BC Managed Care – PPO

## 2023-12-28 ENCOUNTER — Encounter (HOSPITAL_BASED_OUTPATIENT_CLINIC_OR_DEPARTMENT_OTHER): Payer: Self-pay

## 2023-12-28 ENCOUNTER — Ambulatory Visit (HOSPITAL_BASED_OUTPATIENT_CLINIC_OR_DEPARTMENT_OTHER): Payer: BC Managed Care – PPO

## 2023-12-28 ENCOUNTER — Ambulatory Visit (HOSPITAL_BASED_OUTPATIENT_CLINIC_OR_DEPARTMENT_OTHER): Payer: BC Managed Care – PPO | Admitting: Orthopaedic Surgery

## 2023-12-28 DIAGNOSIS — M75122 Complete rotator cuff tear or rupture of left shoulder, not specified as traumatic: Secondary | ICD-10-CM

## 2023-12-28 DIAGNOSIS — M25612 Stiffness of left shoulder, not elsewhere classified: Secondary | ICD-10-CM

## 2023-12-28 DIAGNOSIS — M25512 Pain in left shoulder: Secondary | ICD-10-CM

## 2023-12-28 DIAGNOSIS — M6281 Muscle weakness (generalized): Secondary | ICD-10-CM

## 2023-12-28 MED ORDER — LIDOCAINE HCL 1 % IJ SOLN
4.0000 mL | INTRAMUSCULAR | Status: AC | PRN
  Administered 2023-12-28: 4 mL

## 2023-12-28 MED ORDER — TRIAMCINOLONE ACETONIDE 40 MG/ML IJ SUSP
80.0000 mg | INTRAMUSCULAR | Status: AC | PRN
  Administered 2023-12-28: 80 mg via INTRA_ARTICULAR

## 2023-12-28 NOTE — Therapy (Signed)
OUTPATIENT PHYSICAL THERAPY UPPER EXTREMITY TREATMENT   Patient Name: Preston Davis MRN: [id number] DOB:1971-05-09, 53 y.o., male Today's Date: 12/28/2023  END OF SESSION:       Past Medical History:  Diagnosis Date   Acid reflux    C. difficile colitis    DVT (deep venous thrombosis) (HCC) 2014   left leg   Factor 5 Leiden mutation, heterozygous (HCC)    Heart murmur    High blood cholesterol level    Hypertension    Right shoulder pain    Past Surgical History:  Procedure Laterality Date   AORTIC ARCH ANGIOGRAPHY N/A 10/07/2022   Procedure: AORTIC ARCH ANGIOGRAPHY;  Surgeon: Leonie Douglas, MD;  Location: [facility];  Service: Cardiovascular;  Laterality: N/A;   APPENDECTOMY     bone graft  right great toe     for bunion   PERIPHERAL VASCULAR INTERVENTION Left 10/07/2022   Procedure: PERIPHERAL VASCULAR INTERVENTION;  Surgeon: Leonie Douglas, MD;  Location: [facility];  Service: Cardiovascular;  Laterality: Left;  Subclavian   SHOULDER ARTHROSCOPY WITH ROTATOR CUFF REPAIR Left 10/24/2023   Procedure: LEFT SHOULDER ARTHROSCOPY ROTATOR CUFF REPAIR;  Surgeon: Huel Cote, MD;  Location: [facility];  Service: Orthopedics;  Laterality: Left;   SHOULDER OPEN ROTATOR CUFF REPAIR Right 05/14/2013   Procedure: ROTATOR CUFF REPAIR SHOULDER OPEN;  Surgeon: Darreld Mclean, MD;  Location: [facility];  Service: Orthopedics;  Laterality: Right;   VASECTOMY     Patient Active Problem List   Diagnosis Date Noted   Nontraumatic complete tear of left rotator cuff 10/24/2023   HTN (hypertension) 12/16/2017   C. difficile colitis 12/15/2017    PCP: Elfredia Nevins, MD  REFERRING PROVIDER: Dr Huel Cote   REFERRING DIAG: LEFT SHOULDER COMPLETE ROTATOR CUFF TEAR   THERAPY DIAG:  No diagnosis found.  Rationale for Evaluation and Treatment: Rehabilitation  ONSET DATE: DOS: 10/24/2023  SUBJECTIVE:                                                                                                                                                                                       SUBJECTIVE STATEMENT:  Pt reports ongoing pain in biceps region. Expresses significant concern about this. PTA messaged MD upstairs who agreed to see him. Withheld PT today. Pt later returned stating he received a cortisone injection. Will continue to  monitor pain level.    Hand dominance: Left  PERTINENT HISTORY: Right rotator rotator cuff repair, factor V Leiden mutation Peripheral vascular intervention 11/3 HTN,  PAIN:  Are you having pain? Yes: NPRS scale: 2-3/10 Pain location: anterior and superior shoulder  Pain description: aching  Aggravating factors: moving thr arm  Relieving factors: rest   PRECAUTIONS: None  RED FLAGS: None   WEIGHT BEARING RESTRICTIONS: Yes NWB  FALLS:  Has patient fallen in last 6 months? Yes. Number of falls fell 1x furiture fell on you.  LIVING ENVIRONMENT: Nothing  OCCUPATION: Doctor, hospital   Hobbies:  Banker    PLOF: Independent  PATIENT GOALS:  To have less pain   NEXT MD VISIT:  Nothing scheduled   OBJECTIVE:  Note: Objective measures were completed at Evaluation unless otherwise noted.  DIAGNOSTIC FINDINGS:  Nothing at this time    TODAY'S TREATMENT:                                                                                                                                         DATE:   1/3 Reviewed Pt presentation, HEP compliance, pain level, and response to prior Rx.  Seated scap retraction 2x10 Seated table slides in flexion 2x10 Supine well arm assisted flexion x 10 reps Supine wand flexion x 10 reps Supine wand ER 2x10 reps  Pt received L GH gentle joint distraction with oscillation and Grade I-II AP and inf jt mobs f/b L shoulder flexion, scaption, ER, and IR PROM per pt and tissue tolerance w/n protocol ranges  Updated HEP with supine wand  ER and gave pt a handout.  PT instructed pt in correct form and appropriate frequency.  PT instructed pt to not perform into a painful or tight range.    12/17 PROM L shoulder Well arm assist flexion supine x10 Seated scap sqz x20 Table slides AAROM flexion and scaption x10ea Pendulums HEP update and review    12/10  Manual: trigger point release to upper trap. Triger point release to distal bicpe. Elbow extension stretch with supination. PROM into flexion and ER   Wrist flexion/extension 2lb 2x15  Elbow supination/ pronation 1 lb   Pendulum x20 for warm up    Last visit:   Reviewed pt presentation, HEP compliance, pain level, and response to prior Rx.   PT changed pt's dressings.  PT removed tegaderm and gauze and applied new gauze and tegaderm over portals.  Pt had slight redness at lateral portal though nothing abnormal.  Portals intact with stitches.  Portals were dry and had no drainage.  Pt performed:  Pendulums 2x10 each cw and ccw  Wrist flex/extension AROM x 20 reps  Towel gripping 3x10   Pt received L shoulder PROM in flexion and ER in supine per pt and tissue tolerance w/n protocol ranges.      PATIENT EDUCATION: Education details: HEP, symptom management , importance of the sling, protocol and post op restrictions, dressings, proper sling fit, and exercise form.  Person educated: Patient Education method: Explanation, Demonstration, Tactile cues, Verbal cues, and Handouts Education comprehension: verbalized understanding, returned demonstration, verbal cues  required, tactile cues required, and needs further education  HOME EXERCISE PROGRAM: Access Code: [email or web address] URL: [email or web address] Date: 10/31/2023 Prepared by: Lorayne Bender  Updated HEP:   Supine wand ER  2x10, 2x/day    ASSESSMENT:  CLINICAL IMPRESSION: Pt reports ongoing pain in biceps region. Expresses significant concern about this. PTA messaged MD upstairs who agreed to  see him. Withheld PT today. Pt later returned stating he received a cortisone injection. Will continue to  monitor pain level.   OBJECTIVE IMPAIRMENTS: decreased ROM, decreased strength, impaired UE functional use, and pain.   ACTIVITY LIMITATIONS: carrying, lifting, sleeping, bathing, toileting, dressing, self feeding, reach over head, and hygiene/grooming  PARTICIPATION LIMITATIONS: meal prep, cleaning, laundry, driving, shopping, community activity, occupation, and yard work  PERSONAL FACTORS: 1 comorbidity: previous right RTC tear   are also affecting patient's functional outcome.   REHAB POTENTIAL: Good  CLINICAL DECISION MAKING: Stable/uncomplicated  EVALUATION COMPLEXITY: Low  GOALS: Goals reviewed with patient? Yes  SHORT TERM GOALS: Target date: 12/12/2023    Patient will increase passive flexion to 110 degrees on the left Baseline: Goal status: 105 12/10  2.  Patient will increase passive external rotation 20 degrees on the left Baseline:  Goal status:achieved 40 degrees without end feel 12/10  3.  Patient will wean out of sling per MD Baseline:  Goal status: 3 more weks 12/10   4.  Patient will be independent with a base exercise program Baseline:  Goal status: INITIAL  LONG TERM GOALS: Target date: 01/23/2024    Patient will reach behind his head without increased pain in order to perform daily tasks Baseline:  Goal status: INITIAL  2.  Patient will reach overhead with 2 pound weight in order to put dishes away Baseline:  Goal status: INITIAL  3.  Patient will return to work tasks when cleared by MD Baseline:  Goal status: INITIAL  PLAN: PT FREQUENCY: 1-2x/week  PT DURATION: 8 weeks  PLANNED INTERVENTIONS: 97110-Therapeutic exercises, 97530- Therapeutic activity, [id number]- Neuromuscular re-education, 97535- Self Care, 04540- Manual therapy, [id number]- Aquatic Therapy, 97014- Electrical stimulation (unattended), 97035- Ultrasound, Patient/Family education,  Taping, Dry Needling, DME instructions, Cryotherapy, and Moist heat   PLAN FOR NEXT SESSION:  Cont per Dr. Serena Croissant rotator cuff repair protocol with regard to biceps tenodesis.  PT sent message to MD concerning his sling.  MD responded to PT stating he can remove the sling.  PT will call pt concerning MD message.    Riki Altes, PTA  12/28/23 7:52 AM

## 2023-12-28 NOTE — Progress Notes (Signed)
Post Operative Evaluation    Procedure/Date of Surgery: Left shoulder rotator cuff repair 11/19  Interval History:    Presents today 2 months status post the above procedure.  He has been experiencing increasing biceps type pain with swelling over the biceps.  He presents today for additional assessment and discussion  PMH/PSH/Family History/Social History/Meds/Allergies:    Past Medical History:  Diagnosis Date  . Acid reflux   . C. difficile colitis   . DVT (deep venous thrombosis) (HCC) 2014   left leg  . Factor 5 Leiden mutation, heterozygous (HCC)   . Heart murmur   . High blood cholesterol level   . Hypertension   . Right shoulder pain    Past Surgical History:  Procedure Laterality Date  . AORTIC ARCH ANGIOGRAPHY N/A 10/07/2022   Procedure: AORTIC ARCH ANGIOGRAPHY;  Surgeon: Leonie Douglas, MD;  Location: [facility];  Service: Cardiovascular;  Laterality: N/A;  . APPENDECTOMY    . bone graft  right great toe     for bunion  . PERIPHERAL VASCULAR INTERVENTION Left 10/07/2022   Procedure: PERIPHERAL VASCULAR INTERVENTION;  Surgeon: Leonie Douglas, MD;  Location: [facility];  Service: Cardiovascular;  Laterality: Left;  Subclavian  . SHOULDER ARTHROSCOPY WITH ROTATOR CUFF REPAIR Left 10/24/2023   Procedure: LEFT SHOULDER ARTHROSCOPY ROTATOR CUFF REPAIR;  Surgeon: Huel Cote, MD;  Location: [facility];  Service: Orthopedics;  Laterality: Left;  . SHOULDER OPEN ROTATOR CUFF REPAIR Right 05/14/2013   Procedure: ROTATOR CUFF REPAIR SHOULDER OPEN;  Surgeon: Darreld Mclean, MD;  Location: [facility];  Service: Orthopedics;  Laterality: Right;  Marland Kitchen VASECTOMY     Social History   Socioeconomic History  . Marital status: Married    Spouse name: Not on file  . Number of children: Not on file  . Years of education: Not on file  . Highest education level: Not on file  Occupational History  . Not on file   Tobacco Use  . Smoking status: Former    Current packs/day: 0.00    Types: Cigarettes    Quit date: 06/03/2013    Years since quitting: 10.5    Passive exposure: Never  . Smokeless tobacco: Never  Vaping Use  . Vaping status: Never Used  Substance and Sexual Activity  . Alcohol use: No  . Drug use: No  . Sexual activity: Yes    Birth control/protection: Other-see comments    Comment: had vasectomy  Other Topics Concern  . Not on file  Social History Narrative  . Not on file   Social Drivers of Health   Financial Resource Strain: Not on file  Food Insecurity: Not on file  Transportation Needs: Not on file  Physical Activity: Not on file  Stress: Not on file  Social Connections: Not on file   Family History  Problem Relation Age of Onset  . Clotting disorder Mother   . Diabetes Paternal Grandmother    Allergies  Allergen Reactions  . Benicar [Olmesartan] Other (See Comments)    REACTION: Unable to regulate blood pressure with this medication--caused BP levels to drop too low.   Current Outpatient Medications  Medication Sig Dispense Refill  . ascorbic acid (VITAMIN C) 500 MG tablet Take 500 mg by mouth daily.    Marland Kitchen aspirin EC  81 MG tablet Take 81 mg by mouth daily. Swallow whole.    Marland Kitchen atenolol (TENORMIN) 25 MG tablet Take 25 mg by mouth daily.    Marland Kitchen atorvastatin (LIPITOR) 40 MG tablet TAKE 1 TABLET BY MOUTH EVERY DAY 90 tablet 0  . Atorvastatin Calcium (ATORVALIQ) 20 MG/5ML SUSP Take by mouth.    . clopidogrel (PLAVIX) 75 MG tablet TAKE 1 TABLET BY MOUTH EVERY DAY 90 tablet 3  . Clopidogrel Bisulfate (PLAVIX PO) Take by mouth.    . ezetimibe (ZETIA) 10 MG tablet Take 10 mg by mouth daily.    Marland Kitchen HYDROcodone-acetaminophen (NORCO) 10-325 MG tablet Take 1 tablet by mouth every 6 (six) hours.    Marland Kitchen oxyCODONE (ROXICODONE) 5 MG immediate release tablet Take 1 tablet (5 mg total) by mouth every 4 (four) hours as needed for severe pain (pain score 7-10) or breakthrough pain. 15  tablet 0  . sildenafil (VIAGRA) 100 MG tablet Take 50-100 mg by mouth daily as needed for erectile dysfunction.    Marland Kitchen zolpidem (AMBIEN) 10 MG tablet Take 10 mg by mouth at bedtime.     No current facility-administered medications for this visit.   No results found.  Review of Systems:   A ROS was performed including pertinent positives and negatives as documented in the HPI.   Musculoskeletal Exam:    There were no vitals taken for this visit.  Left shoulder incisions are well-appearing without erythema or drainage.  In the spine position he can actively forward flex to 120 degrees with 45 external rotation at side.  Internal rotation deferred today.  Distal neurosensory exam is intact  Imaging:      I personally reviewed and interpreted the radiographs.   Assessment:   8 weeks status post left shoulder arthroscopic rotator cuff repair overall doing very well.  He is having some significant biceps symptoms and today he does have evidence of anterior overload and given this time recommend an ultrasound-guided injection of the biceps tendon.  He would like to proceed with this.  I will plan to see him back in his regularly scheduled 4 weeks Plan :    -Return clinic 4 weeks for reassessment  Left shoulder ultrasound-guided injection provided after consent obtained   Procedure Note  Patient: Preston Davis             Date of Birth: [birth year]           MRN: [id number]             Visit Date: 12/28/2023  Procedures: Visit Diagnoses: No diagnosis found.  Large Joint Inj: L glenohumeral on 12/28/2023 11:28 AM Indications: pain Details: 22 G 1.5 in needle, ultrasound-guided anterior approach  Arthrogram: No  Medications: 4 mL lidocaine 1 %; 80 mg triamcinolone acetonide 40 MG/ML Outcome: tolerated well, no immediate complications Procedure, treatment alternatives, risks and benefits explained, specific risks discussed. Consent was given by the patient. Immediately prior to  procedure a time out was called to verify the correct patient, procedure, equipment, support staff and site/side marked as required. Patient was prepped and draped in the usual sterile fashion.         I personally saw and evaluated the patient, and participated in the management and treatment plan.  Huel Cote, MD Attending Physician, Orthopedic Surgery  This document was dictated using Dragon voice recognition software. A reasonable attempt at proof reading has been made to minimize errors.

## 2023-12-29 ENCOUNTER — Encounter (HOSPITAL_BASED_OUTPATIENT_CLINIC_OR_DEPARTMENT_OTHER): Payer: Self-pay | Admitting: Orthopaedic Surgery

## 2023-12-29 ENCOUNTER — Other Ambulatory Visit (HOSPITAL_BASED_OUTPATIENT_CLINIC_OR_DEPARTMENT_OTHER): Payer: Self-pay

## 2023-12-29 ENCOUNTER — Other Ambulatory Visit (HOSPITAL_BASED_OUTPATIENT_CLINIC_OR_DEPARTMENT_OTHER): Payer: Self-pay | Admitting: Orthopaedic Surgery

## 2023-12-29 MED ORDER — OXYCODONE HCL 5 MG PO TABS
5.0000 mg | ORAL_TABLET | ORAL | 0 refills | Status: AC | PRN
  Filled 2023-12-29: qty 15, 3d supply, fill #0

## 2024-01-02 ENCOUNTER — Encounter (HOSPITAL_BASED_OUTPATIENT_CLINIC_OR_DEPARTMENT_OTHER): Payer: BC Managed Care – PPO | Admitting: Physical Therapy

## 2024-01-10 ENCOUNTER — Encounter (HOSPITAL_BASED_OUTPATIENT_CLINIC_OR_DEPARTMENT_OTHER): Payer: BC Managed Care – PPO

## 2024-01-10 DIAGNOSIS — M1991 Primary osteoarthritis, unspecified site: Secondary | ICD-10-CM | POA: Diagnosis not present

## 2024-01-10 DIAGNOSIS — I10 Essential (primary) hypertension: Secondary | ICD-10-CM | POA: Diagnosis not present

## 2024-01-10 DIAGNOSIS — G894 Chronic pain syndrome: Secondary | ICD-10-CM | POA: Diagnosis not present

## 2024-01-10 DIAGNOSIS — M47816 Spondylosis without myelopathy or radiculopathy, lumbar region: Secondary | ICD-10-CM | POA: Diagnosis not present

## 2024-01-10 DIAGNOSIS — I739 Peripheral vascular disease, unspecified: Secondary | ICD-10-CM | POA: Diagnosis not present

## 2024-01-10 DIAGNOSIS — E663 Overweight: Secondary | ICD-10-CM | POA: Diagnosis not present

## 2024-01-10 DIAGNOSIS — Z6827 Body mass index (BMI) 27.0-27.9, adult: Secondary | ICD-10-CM | POA: Diagnosis not present

## 2024-01-25 ENCOUNTER — Encounter (HOSPITAL_BASED_OUTPATIENT_CLINIC_OR_DEPARTMENT_OTHER): Payer: BC Managed Care – PPO | Admitting: Orthopaedic Surgery

## 2024-02-09 ENCOUNTER — Other Ambulatory Visit: Payer: Self-pay | Admitting: Vascular Surgery

## 2024-02-09 DIAGNOSIS — Z9229 Personal history of other drug therapy: Secondary | ICD-10-CM | POA: Diagnosis not present

## 2024-02-09 DIAGNOSIS — R7309 Other abnormal glucose: Secondary | ICD-10-CM | POA: Diagnosis not present

## 2024-02-09 DIAGNOSIS — I739 Peripheral vascular disease, unspecified: Secondary | ICD-10-CM | POA: Diagnosis not present

## 2024-02-09 DIAGNOSIS — Z6827 Body mass index (BMI) 27.0-27.9, adult: Secondary | ICD-10-CM | POA: Diagnosis not present

## 2024-02-09 DIAGNOSIS — I10 Essential (primary) hypertension: Secondary | ICD-10-CM | POA: Diagnosis not present

## 2024-02-09 DIAGNOSIS — Z0001 Encounter for general adult medical examination with abnormal findings: Secondary | ICD-10-CM | POA: Diagnosis not present

## 2024-02-09 DIAGNOSIS — Z125 Encounter for screening for malignant neoplasm of prostate: Secondary | ICD-10-CM | POA: Diagnosis not present

## 2024-02-09 DIAGNOSIS — G894 Chronic pain syndrome: Secondary | ICD-10-CM | POA: Diagnosis not present

## 2024-02-09 DIAGNOSIS — M47816 Spondylosis without myelopathy or radiculopathy, lumbar region: Secondary | ICD-10-CM | POA: Diagnosis not present

## 2024-02-09 DIAGNOSIS — Z1331 Encounter for screening for depression: Secondary | ICD-10-CM | POA: Diagnosis not present

## 2024-02-09 DIAGNOSIS — M1991 Primary osteoarthritis, unspecified site: Secondary | ICD-10-CM | POA: Diagnosis not present

## 2024-02-10 LAB — LAB REPORT - SCANNED
A1c: 7.3
EGFR: 85
TSH: 1.28 (ref 0.41–5.90)

## 2024-02-13 NOTE — Telephone Encounter (Signed)
 Atorvastatin refills should be directed to PCP due to required lab monitoring. Thank you

## 2024-03-07 ENCOUNTER — Ambulatory Visit (HOSPITAL_BASED_OUTPATIENT_CLINIC_OR_DEPARTMENT_OTHER): Admitting: Orthopaedic Surgery

## 2024-03-27 ENCOUNTER — Ambulatory Visit (HOSPITAL_BASED_OUTPATIENT_CLINIC_OR_DEPARTMENT_OTHER): Admitting: Orthopaedic Surgery

## 2024-03-27 DIAGNOSIS — M75122 Complete rotator cuff tear or rupture of left shoulder, not specified as traumatic: Secondary | ICD-10-CM

## 2024-03-27 NOTE — Progress Notes (Signed)
 Post Operative Evaluation    Procedure/Date of Surgery: Left shoulder rotator cuff repair 11/19  Interval History:    Presents today status post the above procedure.  He states that he was pain-free until late when he was doing some overhead trimming.  Since that time he has been having a flareup in the anterior lateral aspect of the shoulder.  PMH/PSH/Family History/Social History/Meds/Allergies:    Past Medical History:  Diagnosis Date   Acid reflux    C. difficile colitis    DVT (deep venous thrombosis) (HCC) 2014   left leg   Factor 5 Leiden mutation, heterozygous (HCC)    Heart murmur    High blood cholesterol level    Hypertension    Right shoulder pain    Past Surgical History:  Procedure Laterality Date   AORTIC ARCH ANGIOGRAPHY N/A 10/07/2022   Procedure: AORTIC ARCH ANGIOGRAPHY;  Surgeon: Carlene Che, MD;  Location: [facility];  Service: Cardiovascular;  Laterality: N/A;   APPENDECTOMY     bone graft  right great toe     for bunion   PERIPHERAL VASCULAR INTERVENTION Left 10/07/2022   Procedure: PERIPHERAL VASCULAR INTERVENTION;  Surgeon: Carlene Che, MD;  Location: [facility];  Service: Cardiovascular;  Laterality: Left;  Subclavian   SHOULDER ARTHROSCOPY WITH ROTATOR CUFF REPAIR Left 10/24/2023   Procedure: LEFT SHOULDER ARTHROSCOPY ROTATOR CUFF REPAIR;  Surgeon: Wilhelmenia Harada, MD;  Location: [facility];  Service: Orthopedics;  Laterality: Left;   SHOULDER OPEN ROTATOR CUFF REPAIR Right 05/14/2013   Procedure: ROTATOR CUFF REPAIR SHOULDER OPEN;  Surgeon: Pleasant Brilliant, MD;  Location: [facility];  Service: Orthopedics;  Laterality: Right;   VASECTOMY     Social History   Socioeconomic History   Marital status: Married    Spouse name: Not on file   Number of children: Not on file   Years of education: Not on file   Highest education level: Not on file  Occupational History   Not on file   Tobacco Use   Smoking status: Former    Current packs/day: 0.00    Types: Cigarettes    Quit date: 06/03/2013    Years since quitting: 10.8    Passive exposure: Never   Smokeless tobacco: Never  Vaping Use   Vaping status: Never Used  Substance and Sexual Activity   Alcohol use: No   Drug use: No   Sexual activity: Yes    Birth control/protection: Other-see comments    Comment: had vasectomy  Other Topics Concern   Not on file  Social History Narrative   Not on file   Social Drivers of Health   Financial Resource Strain: Not on file  Food Insecurity: Not on file  Transportation Needs: Not on file  Physical Activity: Not on file  Stress: Not on file  Social Connections: Not on file   Family History  Problem Relation Age of Onset   Clotting disorder Mother    Diabetes Paternal Grandmother    Allergies  Allergen Reactions   Benicar [Olmesartan] Other (See Comments)    REACTION: Unable to regulate blood pressure with this medication--caused BP levels to drop too low.   Current Outpatient Medications  Medication Sig Dispense Refill   ascorbic acid (VITAMIN C) 500 MG tablet Take 500 mg  by mouth daily.     aspirin  EC 81 MG tablet Take 81 mg by mouth daily. Swallow whole.     atenolol (TENORMIN) 25 MG tablet Take 25 mg by mouth daily.     atorvastatin  (LIPITOR) 40 MG tablet TAKE 1 TABLET BY MOUTH EVERY DAY 90 tablet 0   Atorvastatin  Calcium  (ATORVALIQ) 20 MG/5ML SUSP Take by mouth.     clopidogrel  (PLAVIX ) 75 MG tablet TAKE 1 TABLET BY MOUTH EVERY DAY 90 tablet 3   Clopidogrel  Bisulfate (PLAVIX  PO) Take by mouth.     ezetimibe (ZETIA) 10 MG tablet Take 10 mg by mouth daily.     HYDROcodone -acetaminophen  (NORCO) 10-325 MG tablet Take 1 tablet by mouth every 6 (six) hours.     oxyCODONE  (ROXICODONE ) 5 MG immediate release tablet Take 1 tablet (5 mg total) by mouth every 4 (four) hours as needed for severe pain (pain score 7-10) or breakthrough pain. 15 tablet 0   sildenafil  (VIAGRA) 100 MG tablet Take 50-100 mg by mouth daily as needed for erectile dysfunction.     zolpidem  (AMBIEN ) 10 MG tablet Take 10 mg by mouth at bedtime.     No current facility-administered medications for this visit.   No results found.  Review of Systems:   A ROS was performed including pertinent positives and negatives as documented in the HPI.   Musculoskeletal Exam:    There were no vitals taken for this visit.  Left shoulder incisions are well-appearing without erythema or drainage.  In the spine position he can actively forward flex to 120 degrees with 45 external rotation at side.  Internal rotation deferred today.  Distal neurosensory exam is intact  Imaging:      I personally reviewed and interpreted the radiographs.   Assessment:   Status post left shoulder arthroscopic rotator cuff repair overall doing very well.  Today is experiencing some pain and symptoms over the anterior lateral shoulder consistent with impingement type pain.  I have recommended an additional ultrasound-guided injection and I would like to see him back in 2 weeks to check his progress.  I did describe that ultimately this is common during a normal recovery course for the shoulder although we would potentially consider a follow-up MRI if no improvement completely with his injection Plan :    -Return clinic 2 weeks for reassessment   Procedure Note  Patient: Preston Davis             Date of Birth: 1971/05/22           MRN: [id number]             Visit Date: 03/27/2024  Procedures: Visit Diagnoses: No diagnosis found.  No procedures performed      I personally saw and evaluated the patient, and participated in the management and treatment plan.  Wilhelmenia Harada, MD Attending Physician, Orthopedic Surgery  This document was dictated using Dragon voice recognition software. A reasonable attempt at proof reading has been made to minimize errors.

## 2024-04-02 ENCOUNTER — Other Ambulatory Visit: Payer: Self-pay | Admitting: Vascular Surgery

## 2024-04-02 ENCOUNTER — Encounter: Payer: Self-pay | Admitting: Vascular Surgery

## 2024-04-05 ENCOUNTER — Other Ambulatory Visit: Payer: Self-pay | Admitting: *Deleted

## 2024-04-05 MED ORDER — ATORVASTATIN CALCIUM 40 MG PO TABS: 40.0000 mg | ORAL_TABLET | Freq: Every day | ORAL | 0 refills | Status: DC

## 2024-04-09 DIAGNOSIS — G894 Chronic pain syndrome: Secondary | ICD-10-CM | POA: Diagnosis not present

## 2024-04-09 DIAGNOSIS — I10 Essential (primary) hypertension: Secondary | ICD-10-CM | POA: Diagnosis not present

## 2024-04-09 DIAGNOSIS — E663 Overweight: Secondary | ICD-10-CM | POA: Diagnosis not present

## 2024-04-09 DIAGNOSIS — I739 Peripheral vascular disease, unspecified: Secondary | ICD-10-CM | POA: Diagnosis not present

## 2024-04-09 DIAGNOSIS — M1991 Primary osteoarthritis, unspecified site: Secondary | ICD-10-CM | POA: Diagnosis not present

## 2024-04-09 DIAGNOSIS — F5101 Primary insomnia: Secondary | ICD-10-CM | POA: Diagnosis not present

## 2024-04-09 DIAGNOSIS — Z6826 Body mass index (BMI) 26.0-26.9, adult: Secondary | ICD-10-CM | POA: Diagnosis not present

## 2024-04-09 DIAGNOSIS — M47816 Spondylosis without myelopathy or radiculopathy, lumbar region: Secondary | ICD-10-CM | POA: Diagnosis not present

## 2024-04-12 ENCOUNTER — Ambulatory Visit (HOSPITAL_BASED_OUTPATIENT_CLINIC_OR_DEPARTMENT_OTHER): Admitting: Orthopaedic Surgery

## 2024-04-12 DIAGNOSIS — M75122 Complete rotator cuff tear or rupture of left shoulder, not specified as traumatic: Secondary | ICD-10-CM | POA: Diagnosis not present

## 2024-04-12 NOTE — Progress Notes (Signed)
 Post Operative Evaluation    Procedure/Date of Surgery: Left shoulder rotator cuff repair 11/19  Interval History:    Presents today status post the above procedure.  He got approximately 80% relief from his injection.  I have asked him to go easy on the shoulder as he continues to recover  PMH/PSH/Family History/Social History/Meds/Allergies:    Past Medical History:  Diagnosis Date   Acid reflux    C. difficile colitis    DVT (deep venous thrombosis) (HCC) 2014   left leg   Factor 5 Leiden mutation, heterozygous (HCC)    Heart murmur    High blood cholesterol level    Hypertension    Right shoulder pain    Past Surgical History:  Procedure Laterality Date   AORTIC ARCH ANGIOGRAPHY N/A 10/07/2022   Procedure: AORTIC ARCH ANGIOGRAPHY;  Surgeon: Carlene Che, MD;  Location: [facility];  Service: Cardiovascular;  Laterality: N/A;   APPENDECTOMY     bone graft  right great toe     for bunion   PERIPHERAL VASCULAR INTERVENTION Left 10/07/2022   Procedure: PERIPHERAL VASCULAR INTERVENTION;  Surgeon: Carlene Che, MD;  Location: [facility];  Service: Cardiovascular;  Laterality: Left;  Subclavian   SHOULDER ARTHROSCOPY WITH ROTATOR CUFF REPAIR Left 10/24/2023   Procedure: LEFT SHOULDER ARTHROSCOPY ROTATOR CUFF REPAIR;  Surgeon: Wilhelmenia Harada, MD;  Location: [facility];  Service: Orthopedics;  Laterality: Left;   SHOULDER OPEN ROTATOR CUFF REPAIR Right 05/14/2013   Procedure: ROTATOR CUFF REPAIR SHOULDER OPEN;  Surgeon: Pleasant Brilliant, MD;  Location: [facility];  Service: Orthopedics;  Laterality: Right;   VASECTOMY     Social History   Socioeconomic History   Marital status: Married    Spouse name: Not on file   Number of children: Not on file   Years of education: Not on file   Highest education level: Not on file  Occupational History   Not on file  Tobacco Use   Smoking status: Former    Current  packs/day: 0.00    Types: Cigarettes    Quit date: 06/03/2013    Years since quitting: 10.8    Passive exposure: Never   Smokeless tobacco: Never  Vaping Use   Vaping status: Never Used  Substance and Sexual Activity   Alcohol use: No   Drug use: No   Sexual activity: Yes    Birth control/protection: Other-see comments    Comment: had vasectomy  Other Topics Concern   Not on file  Social History Narrative   Not on file   Social Drivers of Health   Financial Resource Strain: Not on file  Food Insecurity: Not on file  Transportation Needs: Not on file  Physical Activity: Not on file  Stress: Not on file  Social Connections: Not on file   Family History  Problem Relation Age of Onset   Clotting disorder Mother    Diabetes Paternal Grandmother    Allergies  Allergen Reactions   Benicar [Olmesartan] Other (See Comments)    REACTION: Unable to regulate blood pressure with this medication--caused BP levels to drop too low.   Current Outpatient Medications  Medication Sig Dispense Refill   ascorbic acid (VITAMIN C) 500 MG tablet Take 500 mg by mouth daily.     aspirin  EC  81 MG tablet Take 81 mg by mouth daily. Swallow whole.     atenolol (TENORMIN) 25 MG tablet Take 25 mg by mouth daily.     atorvastatin  (LIPITOR) 40 MG tablet Take 1 tablet (40 mg total) by mouth daily. 90 tablet 0   atorvastatin  (LIPITOR) 40 MG tablet Take 1 tablet (40 mg total) by mouth daily. 90 tablet 0   Atorvastatin  Calcium  (ATORVALIQ) 20 MG/5ML SUSP Take by mouth.     clopidogrel  (PLAVIX ) 75 MG tablet TAKE 1 TABLET BY MOUTH EVERY DAY 90 tablet 3   Clopidogrel  Bisulfate (PLAVIX  PO) Take by mouth.     ezetimibe (ZETIA) 10 MG tablet Take 10 mg by mouth daily.     HYDROcodone -acetaminophen  (NORCO) 10-325 MG tablet Take 1 tablet by mouth every 6 (six) hours.     oxyCODONE  (ROXICODONE ) 5 MG immediate release tablet Take 1 tablet (5 mg total) by mouth every 4 (four) hours as needed for severe pain (pain score  7-10) or breakthrough pain. 15 tablet 0   sildenafil (VIAGRA) 100 MG tablet Take 50-100 mg by mouth daily as needed for erectile dysfunction.     zolpidem  (AMBIEN ) 10 MG tablet Take 10 mg by mouth at bedtime.     No current facility-administered medications for this visit.   No results found.  Review of Systems:   A ROS was performed including pertinent positives and negatives as documented in the HPI.   Musculoskeletal Exam:    There were no vitals taken for this visit.  Left shoulder incisions are well-appearing without erythema or drainage.  In the spine position he can actively forward flex to 120 degrees with 45 external rotation at side.  Internal rotation deferred today.  Distal neurosensory exam is intact  Imaging:      I personally reviewed and interpreted the radiographs.   Assessment:   Status post left shoulder arthroscopic rotator cuff repair overall doing very well.  I did discuss that his injection has relieved his capsulitis type symptoms and of asked that he go easy on this.  He can participate in yard work although if he is experiencing pain and soreness I would like him to try and dial back.  I will plan to see him back as needed Plan :    - I did discuss that should he have an additional flareup we would consider an MRI and possible capsular release   I personally saw and evaluated the patient, and participated in the management and treatment plan.  Wilhelmenia Harada, MD Attending Physician, Orthopedic Surgery  This document was dictated using Dragon voice recognition software. A reasonable attempt at proof reading has been made to minimize errors.

## 2024-04-22 ENCOUNTER — Other Ambulatory Visit (INDEPENDENT_AMBULATORY_CARE_PROVIDER_SITE_OTHER): Payer: Self-pay

## 2024-04-22 ENCOUNTER — Ambulatory Visit: Admitting: Orthopedic Surgery

## 2024-04-22 DIAGNOSIS — M79672 Pain in left foot: Secondary | ICD-10-CM

## 2024-04-22 DIAGNOSIS — M79671 Pain in right foot: Secondary | ICD-10-CM

## 2024-04-22 DIAGNOSIS — M2021 Hallux rigidus, right foot: Secondary | ICD-10-CM | POA: Diagnosis not present

## 2024-04-30 ENCOUNTER — Encounter: Payer: Self-pay | Admitting: Orthopedic Surgery

## 2024-04-30 NOTE — Progress Notes (Signed)
 Office Visit Note   Patient: Preston Davis           Date of Birth: [birth year]           MRN: [id number] Visit Date: 04/22/2024              Requested by: Kathyleen Parkins, MD [address] [place],  [place] [address] PCP: Kathyleen Parkins, MD  Chief Complaint  Patient presents with   Right Foot - Pain   Left Foot - Injury      HPI: Patient is a 53 year old gentleman who is seen for initial evaluation for left foot and right foot pain.  Patient states that recently he broke his little toe on the left foot.  States he struck his foot on the corner of a cabinet about a month ago.  Patient states he had bunion surgery on the right foot about 20 years ago which never healed properly.  Patient is currently on Plavix  for a peripheral stent.  Assessment & Plan: Visit Diagnoses:  1. Pain in right foot   2. Pain in left foot     Plan: The left little toe fracture is healing uneventfully.  Discussed that for treatment of the great toe hallux rigidus and bunion deformity treatment would involve a fusion.  Discussed postoperative course as well as risk and benefits.  Patient states he will call if he is ready to proceed with right great toe MTP fusion.  Follow-Up Instructions: Return if symptoms worsen or fail to improve.   Ortho Exam  Patient is alert, oriented, no adenopathy, well-dressed, normal affect, normal respiratory effort. Examination patient's left little toe is straight without angular deformity.  There is no ecchymosis or bruising at this time.  Examination of the right foot patient has dorsiflexion of the ankle to neutral.  Right great toe is dorsiflexion to 0 degrees plantarflexion of 20 degrees.  There is palpable bony spurs at the MTP joint that are tender to palpation is also tender to palpation medially with a loose body.  Radiographs also show Perry articular cysts.  Imaging: No results found. No images are attached to the encounter.  Labs: No results found for:  "HGBA1C", "ESRSEDRATE", "CRP", "LABURIC", "REPTSTATUS", "GRAMSTAIN", "CULT", "LABORGA"   Lab Results  Component Value Date   ALBUMIN 3.7 12/15/2017   ALBUMIN 4.1 11/28/2017   ALBUMIN 4.0 01/15/2014    No results found for: "MG" No results found for: "VD25OH"  No results found for: "PREALBUMIN"    Latest Ref Rng & Units 10/07/2022    5:58 AM 12/17/2017    6:12 AM 12/15/2017    1:12 PM  CBC EXTENDED  WBC 4.0 - 10.5 K/uL  6.8  9.9   RBC 4.22 - 5.81 MIL/uL  4.47  4.79   Hemoglobin 13.0 - 17.0 g/dL 13.0  86.5  78.4   HCT 39.0 - 52.0 % 43.0  39.0  41.7   Platelets 150 - 400 K/uL  207  218      There is no height or weight on file to calculate BMI.  Orders:  Orders Placed This Encounter  Procedures   XR Foot Complete Right   XR Foot Complete Left   No orders of the defined types were placed in this encounter.    Procedures: No procedures performed  Clinical Data: No additional findings.  ROS:  All other systems negative, except as noted in the HPI. Review of Systems  Objective: Vital Signs: There were no vitals taken for this  visit.  Specialty Comments:  No specialty comments available.  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 difficile colitis 12/15/2017   Past Medical History:  Diagnosis Date   Acid reflux    C. difficile colitis    DVT (deep venous thrombosis) (HCC) 2014   left leg   Factor 5 Leiden mutation, heterozygous (HCC)    Heart murmur    High blood cholesterol level    Hypertension    Right shoulder pain     Family History  Problem Relation Age of Onset   Clotting disorder Mother    Diabetes Paternal Grandmother     Past Surgical History:  Procedure Laterality Date   AORTIC ARCH ANGIOGRAPHY N/A 10/07/2022   Procedure: AORTIC ARCH ANGIOGRAPHY;  Surgeon: Carlene Che, MD;  Location: [facility];   Service: Cardiovascular;  Laterality: N/A;   APPENDECTOMY     bone graft  right great toe     for bunion   PERIPHERAL VASCULAR INTERVENTION Left 10/07/2022   Procedure: PERIPHERAL VASCULAR INTERVENTION;  Surgeon: Carlene Che, MD;  Location: [facility];  Service: Cardiovascular;  Laterality: Left;  Subclavian   SHOULDER ARTHROSCOPY WITH ROTATOR CUFF REPAIR Left 10/24/2023   Procedure: LEFT SHOULDER ARTHROSCOPY ROTATOR CUFF REPAIR;  Surgeon: Wilhelmenia Harada, MD;  Location: [facility];  Service: Orthopedics;  Laterality: Left;   SHOULDER OPEN ROTATOR CUFF REPAIR Right 05/14/2013   Procedure: ROTATOR CUFF REPAIR SHOULDER OPEN;  Surgeon: Pleasant Brilliant, MD;  Location: [facility];  Service: Orthopedics;  Laterality: Right;   VASECTOMY     Social History   Occupational History   Not on file  Tobacco Use   Smoking status: Former    Current packs/day: 0.00    Types: Cigarettes    Quit date: 06/03/2013    Years since quitting: 10.9    Passive exposure: Never   Smokeless tobacco: Never  Vaping Use   Vaping status: Never Used  Substance and Sexual Activity   Alcohol use: No   Drug use: No   Sexual activity: Yes    Birth control/protection: Other-see comments    Comment: had vasectomy

## 2024-05-06 NOTE — Progress Notes (Signed)
 [facility] [place]  ASSESSMENT / PLAN: Preston Davis is a 53 y.o. male status post left subclavian artery stenting 10/07/22 for disabling claudication. Good technical result achieved.  Recommend:  Abstinence from all tobacco products. Blood glucose control with goal A1c < 7%. Blood pressure control with goal blood pressure < 130/80 mmHg. Lipid reduction therapy with goal LDL-C < 55 mg/dL. Aspirin  81mg  by mouth daily. Atorvastatin  40-80mg  PO QD (or other "high intensity" statin therapy).  Follow up with me in 12 months.  CHIEF COMPLAINT: Left arm pain  HISTORY OF PRESENT ILLNESS: Preston Davis is a 53 y.o. male who presents to clinic for evaluation of left arm pain with overhead activity.  The patient has had an extensive work-up by his primary care physician, which has identified severe left subclavian artery stenosis.  The patient reports a fairly classic history of upper extremity claudication type symptoms.  He has easy fatigability, and pain with overhead and strenuous activity left upper extremity.  [name], but does write with his right hand.  We had a long discussion about the risk, benefits, and alternatives of an intervention.   09/06/22: Patient returns to clinic to clinic.  He would like to proceed with stenting as he does not feel his symptoms are getting any better.  11/01/22: Returns for follow-up evaluation after left subclavian artery stenting.  Left arm pain much improved.  He does have some shoulder discomfort with overhead activity and heavy lifting.  He has a history of rotator cuff issues on the right side.  03/07/23: Patient returns to clinic for evaluation.  He has developed radiating discomfort down his left arm.  He notices this when his arm is abducted.  He does not describe pain in the hand or wrist.  He does not report effort induced cramping discomfort.  11/07/23: Doing well overall.  Underwent shoulder surgery  with Dr. Hermina Loosen.  He feels he is improving.  No claudication type discomfort in the left upper extremity.  We reviewed his noninvasive testing.  05/07/24: Doing well overall. No major issues. Reviewed his non-invasive testing. Counseled he may discontinue Plavix .   Past Medical History:  Diagnosis Date   Acid reflux    C. difficile colitis    DVT (deep venous thrombosis) (HCC) 2014   left leg   Factor 5 Leiden mutation, heterozygous (HCC)    Heart murmur    High blood cholesterol level    Hypertension    Right shoulder pain     Past Surgical History:  Procedure Laterality Date   AORTIC ARCH ANGIOGRAPHY N/A 10/07/2022   Procedure: AORTIC ARCH ANGIOGRAPHY;  Surgeon: Carlene Che, MD;  Location: [facility];  Service: Cardiovascular;  Laterality: N/A;   APPENDECTOMY     bone graft  right great toe     for bunion   PERIPHERAL VASCULAR INTERVENTION Left 10/07/2022   Procedure: PERIPHERAL VASCULAR INTERVENTION;  Surgeon: Carlene Che, MD;  Location: [facility];  Service: Cardiovascular;  Laterality: Left;  Subclavian   SHOULDER ARTHROSCOPY WITH ROTATOR CUFF REPAIR Left 10/24/2023   Procedure: LEFT SHOULDER ARTHROSCOPY ROTATOR CUFF REPAIR;  Surgeon: Wilhelmenia Harada, MD;  Location: [facility];  Service: Orthopedics;  Laterality: Left;   SHOULDER OPEN ROTATOR CUFF REPAIR Right 05/14/2013   Procedure: ROTATOR CUFF REPAIR SHOULDER OPEN;  Surgeon: Pleasant Brilliant, MD;  Location: [facility];  Service: Orthopedics;  Laterality: Right;   VASECTOMY  Family History  Problem Relation Age of Onset   Clotting disorder Mother    Diabetes Paternal Grandmother     Social History   Socioeconomic History   Marital status: Married    Spouse name: Not on file   Number of children: Not on file   Years of education: Not on file   Highest education level: Not on file  Occupational History   Not on file  Tobacco Use   Smoking status: Former    Current packs/day:  0.00    Types: Cigarettes    Quit date: 06/03/2013    Years since quitting: 10.9    Passive exposure: Never   Smokeless tobacco: Never  Vaping Use   Vaping status: Never Used  Substance and Sexual Activity   Alcohol use: No   Drug use: No   Sexual activity: Yes    Birth control/protection: Other-see comments    Comment: had vasectomy  Other Topics Concern   Not on file  Social History Narrative   Not on file   Social Drivers of Health   Financial Resource Strain: Not on file  Food Insecurity: Not on file  Transportation Needs: Not on file  Physical Activity: Not on file  Stress: Not on file  Social Connections: Not on file  Intimate Partner Violence: Not on file    Allergies  Allergen Reactions   Benicar [Olmesartan] Other (See Comments)    REACTION: Unable to regulate blood pressure with this medication--caused BP levels to drop too low.    Current Outpatient Medications  Medication Sig Dispense Refill   ascorbic acid (VITAMIN C) 500 MG tablet Take 500 mg by mouth daily.     aspirin  EC 81 MG tablet Take 81 mg by mouth daily. Swallow whole.     atenolol (TENORMIN) 25 MG tablet Take 25 mg by mouth daily.     atorvastatin  (LIPITOR) 40 MG tablet Take 1 tablet (40 mg total) by mouth daily. 90 tablet 0   atorvastatin  (LIPITOR) 40 MG tablet Take 1 tablet (40 mg total) by mouth daily. 90 tablet 0   Atorvastatin  Calcium  (ATORVALIQ) 20 MG/5ML SUSP Take by mouth.     clopidogrel  (PLAVIX ) 75 MG tablet TAKE 1 TABLET BY MOUTH EVERY DAY 90 tablet 3   Clopidogrel  Bisulfate (PLAVIX  PO) Take by mouth.     ezetimibe (ZETIA) 10 MG tablet Take 10 mg by mouth daily.     HYDROcodone -acetaminophen  (NORCO) 10-325 MG tablet Take 1 tablet by mouth every 6 (six) hours.     oxyCODONE  (ROXICODONE ) 5 MG immediate release tablet Take 1 tablet (5 mg total) by mouth every 4 (four) hours as needed for severe pain (pain score 7-10) or breakthrough pain. 15 tablet 0   sildenafil (VIAGRA) 100 MG tablet  Take 50-100 mg by mouth daily as needed for erectile dysfunction.     zolpidem  (AMBIEN ) 10 MG tablet Take 10 mg by mouth at bedtime.     No current facility-administered medications for this visit.    PHYSICAL EXAM Vitals:   05/07/24 0828  BP: 115/78  Pulse: (!) 46  Temp: 98 F (36.7 C)  TempSrc: Temporal  SpO2: 96%  Weight: 175 lb 6.4 oz (79.6 kg)  Height: 5\' 8"  (1.727 m)     Well-appearing man in no acute distress Regular rate and rhythm Unlabored breathing 2+ right radial pulse. 2+ left brachial and radial pulse.  PERTINENT LABORATORY AND RADIOLOGIC DATA  Most recent CBC    Latest Ref Rng & Units  10/07/2022    5:58 AM 12/17/2017    6:12 AM 12/15/2017    1:12 PM  CBC  WBC 4.0 - 10.5 K/uL  6.8  9.9   Hemoglobin 13.0 - 17.0 g/dL 16.1  09.6  04.5   Hematocrit 39.0 - 52.0 % 43.0  39.0  41.7   Platelets 150 - 400 K/uL  207  218      Most recent CMP    Latest Ref Rng & Units 10/07/2022    5:58 AM 12/17/2017    6:12 AM 12/16/2017    6:19 AM  CMP  Glucose 70 - 99 mg/dL 409  811  914   BUN 6 - 20 mg/dL 12  10  9    Creatinine 0.61 - 1.24 mg/dL 7.82  9.56  2.13   Sodium 135 - 145 mmol/L 142  139  138   Potassium 3.5 - 5.1 mmol/L 3.6  3.7  3.7   Chloride 98 - 111 mmol/L 107  105  107   CO2 22 - 32 mmol/L  25  21   Calcium  8.9 - 10.3 mg/dL  8.4  8.5    Duplex ultrasound of the left upper extremity shows no flow-limiting stenosis in the left upper extremity.  Triphasic flow throughout the visualized arteries.  Heber Little. Edgardo Goodwill, MD FACS [facility] Phone Number: [phone] 05/06/2024 8:41 AM  Total time spent on preparing this encounter including chart review, data review, collecting history, examining the patient, coordinating care for this established patient, 20 minutes  Portions of this report may have been transcribed using voice recognition software.  Every effort has been made to ensure accuracy; however, inadvertent  computerized transcription errors may still be present.

## 2024-05-07 ENCOUNTER — Ambulatory Visit (HOSPITAL_COMMUNITY)
Admission: RE | Admit: 2024-05-07 | Discharge: 2024-05-07 | Disposition: A | Discharge status: Home / Self Care | Payer: BC Managed Care – PPO | Source: Ambulatory Visit | Attending: Vascular Surgery | Admitting: Vascular Surgery

## 2024-05-07 ENCOUNTER — Ambulatory Visit: Payer: BC Managed Care – PPO | Attending: Vascular Surgery | Admitting: Vascular Surgery

## 2024-05-07 ENCOUNTER — Encounter: Payer: Self-pay | Admitting: Vascular Surgery

## 2024-05-07 VITALS — BP 115/78 | HR 46 | Temp 98.0°F | Ht 68.0 in | Wt 175.4 lb

## 2024-05-07 DIAGNOSIS — I771 Stricture of artery: Secondary | ICD-10-CM

## 2024-05-07 DIAGNOSIS — I739 Peripheral vascular disease, unspecified: Secondary | ICD-10-CM | POA: Diagnosis not present

## 2024-05-14 DIAGNOSIS — L84 Corns and callosities: Secondary | ICD-10-CM | POA: Diagnosis not present

## 2024-05-14 DIAGNOSIS — I10 Essential (primary) hypertension: Secondary | ICD-10-CM | POA: Diagnosis not present

## 2024-05-14 DIAGNOSIS — M47816 Spondylosis without myelopathy or radiculopathy, lumbar region: Secondary | ICD-10-CM | POA: Diagnosis not present

## 2024-05-14 DIAGNOSIS — I739 Peripheral vascular disease, unspecified: Secondary | ICD-10-CM | POA: Diagnosis not present

## 2024-05-14 DIAGNOSIS — G894 Chronic pain syndrome: Secondary | ICD-10-CM | POA: Diagnosis not present

## 2024-05-14 DIAGNOSIS — M21611 Bunion of right foot: Secondary | ICD-10-CM | POA: Diagnosis not present

## 2024-05-14 DIAGNOSIS — M21612 Bunion of left foot: Secondary | ICD-10-CM | POA: Diagnosis not present

## 2024-05-14 DIAGNOSIS — E663 Overweight: Secondary | ICD-10-CM | POA: Diagnosis not present

## 2024-05-14 DIAGNOSIS — M1991 Primary osteoarthritis, unspecified site: Secondary | ICD-10-CM | POA: Diagnosis not present

## 2024-05-14 DIAGNOSIS — Z6827 Body mass index (BMI) 27.0-27.9, adult: Secondary | ICD-10-CM | POA: Diagnosis not present

## 2024-05-14 DIAGNOSIS — E1159 Type 2 diabetes mellitus with other circulatory complications: Secondary | ICD-10-CM | POA: Diagnosis not present

## 2024-05-30 ENCOUNTER — Ambulatory Visit: Payer: Self-pay | Admitting: Nurse Practitioner

## 2024-06-18 ENCOUNTER — Encounter: Attending: Internal Medicine | Admitting: Nutrition

## 2024-06-18 ENCOUNTER — Encounter: Payer: Self-pay | Admitting: Nutrition

## 2024-06-18 VITALS — Ht 68.0 in | Wt 177.8 lb

## 2024-06-18 DIAGNOSIS — E118 Type 2 diabetes mellitus with unspecified complications: Secondary | ICD-10-CM | POA: Diagnosis not present

## 2024-06-18 NOTE — Progress Notes (Signed)
 Medical Nutrition Therapy  Appointment Start time:  0800  Appointment End time:  0900  Primary concerns today: Type 2 DM, new onset  Referral diagnosis: E11.8 Preferred learning style: no preference  Learning readiness: Ready    NUTRITION ASSESSMENT  53 yr old wmale referred by Dr. Bertell for Type 2 DM. MY most recent A1C I think was 6.6%, down from 7.3% PMH; CAD- had a stent put in. Had a DVT. On 2 cholesterol medications. Quit smoking in 2014. Currently on Glimepiride.2 mg a day. Typically eats 1-2 meals per day. Grows a garden. Eats most meals at home Drinks mostly water . He is on pain medications for chronic pain. Works in a [employer].  Walk some on his job. He is willing to work on eating more whole plant based foods and eating meals more consistently to reverse his DM.  Clinical Medical Hx:  Past Medical History:  Diagnosis Date   Acid reflux    C. difficile colitis    DVT (deep venous thrombosis) (HCC) 2014   left leg   Factor 5 Leiden mutation, heterozygous (HCC)    Heart murmur    High blood cholesterol level    Hypertension    Right shoulder pain     Medications:  Current Outpatient Medications on File Prior to Visit  Medication Sig Dispense Refill   ascorbic acid (VITAMIN C) 500 MG tablet Take 500 mg by mouth daily.     aspirin  EC 81 MG tablet Take 81 mg by mouth daily. Swallow whole.     atenolol (TENORMIN) 25 MG tablet Take 25 mg by mouth daily.     atorvastatin  (LIPITOR) 40 MG tablet Take 1 tablet (40 mg total) by mouth daily. 90 tablet 0   atorvastatin  (LIPITOR) 40 MG tablet Take 1 tablet (40 mg total) by mouth daily. 90 tablet 0   Atorvastatin  Calcium  (ATORVALIQ) 20 MG/5ML SUSP Take by mouth.     clopidogrel  (PLAVIX ) 75 MG tablet TAKE 1 TABLET BY MOUTH EVERY DAY 90 tablet 3   Clopidogrel  Bisulfate (PLAVIX  PO) Take by mouth.     ezetimibe (ZETIA) 10 MG tablet Take 10 mg by mouth daily.     glimepiride (AMARYL) 2 MG tablet Take 2 mg by mouth  daily with breakfast.     HYDROcodone -acetaminophen  (NORCO) 10-325 MG tablet Take 1 tablet by mouth every 6 (six) hours.     oxyCODONE  (ROXICODONE ) 5 MG immediate release tablet Take 1 tablet (5 mg total) by mouth every 4 (four) hours as needed for severe pain (pain score 7-10) or breakthrough pain. 15 tablet 0   sildenafil (VIAGRA) 100 MG tablet Take 50-100 mg by mouth daily as needed for erectile dysfunction.     zolpidem  (AMBIEN ) 10 MG tablet Take 10 mg by mouth at bedtime.     No current facility-administered medications on file prior to visit.    Labs:  7.3%. Notable Signs/Symptoms: sweats a lot  Lifestyle & Dietary Hx Lives with wife and they eat at home mostly.  Estimated daily fluid intake: 40 oz Supplements:  Sleep: 7-8 hrs Stress / self-care:  Current average weekly physical activity: ADL  24-Hr Dietary Recall Skips breakfast Lunch; crustable Dinner home cooked meal Water    Estimated Energy Needs Calories: 2000 Carbohydrate: 225g Protein: 150g Fat: 56g   NUTRITION DIAGNOSIS  NB-1.1 Food and nutrition-related knowledge deficit As related to inconsistent food intake.  As evidenced by A`C 7.3% with eating only 1 main meal per day.SABRA   NUTRITION INTERVENTION  Nutrition education (E-1) on the following topics:  Nutrition and Diabetes education provided on My Plate, CHO counting, meal planning, portion sizes, timing of meals, avoiding snacks between meals unless having a low blood sugar, target ranges for A1C and blood sugars, signs/symptoms and treatment of hyper/hypoglycemia, monitoring blood sugars, taking medications as prescribed, benefits of exercising 30 minutes per day and prevention of complications of DM.   Handouts Provided Include  Know your numbers S/s of hyper/hypoglycemia Lifestyle Medicine handouts  Learning Style & Readiness for Change Teaching method utilized: Visual & Auditory  Demonstrated degree of understanding via: Teach Back  Barriers to  learning/adherence to lifestyle change: none  Goals Established by Pt Eat 3 meals per day Increase water  intake to 5 bottles per day Continue to increase fruits, vegetables from the garden Call MD office to get prescription for meter and testing supplies Test twice a day; before breakfast and bedtime Get A1C to 6.5%.   MONITORING & EVALUATION Dietary intake, weekly physical activity, and blood sugars in 1 month.  Next Steps  Patient is to work on eating meals consistently at times discussed.SABRA

## 2024-06-18 NOTE — Patient Instructions (Signed)
 Goals  Eat 3 meals per day Increase water  intake to 5 bottles per day Continue to increase fruits, vegetables from the garden Call MD office to get prescription for meter and testing supplies Test twice a day; before breakfast and bedtime Get A1C to 6.5%.

## 2024-06-30 ENCOUNTER — Other Ambulatory Visit: Payer: Self-pay | Admitting: Surgery

## 2024-07-05 ENCOUNTER — Ambulatory Visit (HOSPITAL_BASED_OUTPATIENT_CLINIC_OR_DEPARTMENT_OTHER): Admitting: Orthopaedic Surgery

## 2024-07-05 DIAGNOSIS — M75122 Complete rotator cuff tear or rupture of left shoulder, not specified as traumatic: Secondary | ICD-10-CM

## 2024-07-05 NOTE — Progress Notes (Signed)
 Post Operative Evaluation    Procedure/Date of Surgery: Left shoulder rotator cuff repair 11/19  Interval History:   Presents today status post the above procedure.  He is still having persistent lateral based shoulder pain.  This is now failed 2 additional injections following surgery.  As result I would like to obtain an MRI to assess his repair status  PMH/PSH/Family History/Social History/Meds/Allergies:    Past Medical History:  Diagnosis Date   Acid reflux    C. difficile colitis    DVT (deep venous thrombosis) (HCC) 2014   left leg   Factor 5 Leiden mutation, heterozygous (HCC)    Heart murmur    High blood cholesterol level    Hypertension    Right shoulder pain    Past Surgical History:  Procedure Laterality Date   AORTIC ARCH ANGIOGRAPHY N/A 10/07/2022   Procedure: AORTIC ARCH ANGIOGRAPHY;  Surgeon: Magda Debby SAILOR, MD;  Location: [facility];  Service: Cardiovascular;  Laterality: N/A;   APPENDECTOMY     bone graft  right great toe     for bunion   PERIPHERAL VASCULAR INTERVENTION Left 10/07/2022   Procedure: PERIPHERAL VASCULAR INTERVENTION;  Surgeon: Magda Debby SAILOR, MD;  Location: [facility];  Service: Cardiovascular;  Laterality: Left;  Subclavian   SHOULDER ARTHROSCOPY WITH ROTATOR CUFF REPAIR Left 10/24/2023   Procedure: LEFT SHOULDER ARTHROSCOPY ROTATOR CUFF REPAIR;  Surgeon: Genelle Standing, MD;  Location: [facility];  Service: Orthopedics;  Laterality: Left;   SHOULDER OPEN ROTATOR CUFF REPAIR Right 05/14/2013   Procedure: ROTATOR CUFF REPAIR SHOULDER OPEN;  Surgeon: Lemond Stable, MD;  Location: [facility];  Service: Orthopedics;  Laterality: Right;   VASECTOMY     Social History   Socioeconomic History   Marital status: Married    Spouse name: Not on file   Number of children: Not on file   Years of education: Not on file   Highest education level: Not on file  Occupational History   Not  on file  Tobacco Use   Smoking status: Former    Current packs/day: 0.00    Types: Cigarettes    Quit date: 06/03/2013    Years since quitting: 11.0    Passive exposure: Never   Smokeless tobacco: Never  Vaping Use   Vaping status: Never Used  Substance and Sexual Activity   Alcohol use: No   Drug use: No   Sexual activity: Yes    Birth control/protection: Other-see comments    Comment: had vasectomy  Other Topics Concern   Not on file  Social History Narrative   Not on file   Social Drivers of Health   Financial Resource Strain: Not on file  Food Insecurity: Not on file  Transportation Needs: Not on file  Physical Activity: Not on file  Stress: Not on file  Social Connections: Not on file   Family History  Problem Relation Age of Onset   Clotting disorder Mother    Diabetes Paternal Grandmother    Allergies  Allergen Reactions   Benicar [Olmesartan] Other (See Comments)    REACTION: Unable to regulate blood pressure with this medication--caused BP levels to drop too low.   Current Outpatient Medications  Medication Sig Dispense Refill   ascorbic acid (VITAMIN C) 500 MG tablet Take 500 mg  by mouth daily.     aspirin  EC 81 MG tablet Take 81 mg by mouth daily. Swallow whole.     atenolol (TENORMIN) 25 MG tablet Take 25 mg by mouth daily.     atorvastatin  (LIPITOR) 40 MG tablet Take 1 tablet (40 mg total) by mouth daily. 90 tablet 0   atorvastatin  (LIPITOR) 40 MG tablet TAKE 1 TABLET BY MOUTH EVERY DAY 90 tablet 0   Atorvastatin  Calcium  (ATORVALIQ) 20 MG/5ML SUSP Take by mouth.     clopidogrel  (PLAVIX ) 75 MG tablet TAKE 1 TABLET BY MOUTH EVERY DAY 90 tablet 3   Clopidogrel  Bisulfate (PLAVIX  PO) Take by mouth.     ezetimibe (ZETIA) 10 MG tablet Take 10 mg by mouth daily.     glimepiride (AMARYL) 2 MG tablet Take 2 mg by mouth daily with breakfast.     HYDROcodone -acetaminophen  (NORCO) 10-325 MG tablet Take 1 tablet by mouth every 6 (six) hours.     oxyCODONE   (ROXICODONE ) 5 MG immediate release tablet Take 1 tablet (5 mg total) by mouth every 4 (four) hours as needed for severe pain (pain score 7-10) or breakthrough pain. 15 tablet 0   sildenafil (VIAGRA) 100 MG tablet Take 50-100 mg by mouth daily as needed for erectile dysfunction.     zolpidem  (AMBIEN ) 10 MG tablet Take 10 mg by mouth at bedtime.     No current facility-administered medications for this visit.   No results found.  Review of Systems:   A ROS was performed including pertinent positives and negatives as documented in the HPI.   Musculoskeletal Exam:    There were no vitals taken for this visit.  Left shoulder incisions are well-appearing without erythema or drainage.  In the spine position he can actively forward flex to 120 degrees with 45 external rotation at side.  Internal rotation deferred today.  Distal neurosensory exam is intact  Imaging:      I personally reviewed and interpreted the radiographs.   Assessment:   Status post left shoulder arthroscopic rotator cuff repair with residual pain.  At this time he has having a very difficult time with overhead activity and as a result I would like to obtain an MRI to assess his repair particularly as now he has failed 2 additional injections Plan :    - Plan for MRI left shoulder and follow-up to discuss results   I personally saw and evaluated the patient, and participated in the management and treatment plan.  Elspeth Parker, MD Attending Physician, Orthopedic Surgery  This document was dictated using Dragon voice recognition software. A reasonable attempt at proof reading has been made to minimize errors.

## 2024-07-10 ENCOUNTER — Ambulatory Visit (HOSPITAL_BASED_OUTPATIENT_CLINIC_OR_DEPARTMENT_OTHER): Admitting: Orthopaedic Surgery

## 2024-07-10 ENCOUNTER — Encounter (HOSPITAL_BASED_OUTPATIENT_CLINIC_OR_DEPARTMENT_OTHER): Payer: Self-pay | Admitting: Orthopaedic Surgery

## 2024-07-14 ENCOUNTER — Other Ambulatory Visit

## 2024-07-17 ENCOUNTER — Ambulatory Visit (HOSPITAL_BASED_OUTPATIENT_CLINIC_OR_DEPARTMENT_OTHER): Admitting: Orthopaedic Surgery

## 2024-07-18 ENCOUNTER — Encounter (HOSPITAL_BASED_OUTPATIENT_CLINIC_OR_DEPARTMENT_OTHER): Payer: Self-pay | Admitting: Orthopaedic Surgery

## 2024-07-23 ENCOUNTER — Ambulatory Visit
Admission: RE | Admit: 2024-07-23 | Discharge: 2024-07-23 | Disposition: A | Discharge status: Home / Self Care | Source: Ambulatory Visit | Attending: Orthopaedic Surgery | Admitting: Orthopaedic Surgery

## 2024-07-23 DIAGNOSIS — M75122 Complete rotator cuff tear or rupture of left shoulder, not specified as traumatic: Secondary | ICD-10-CM

## 2024-07-23 DIAGNOSIS — M67814 Other specified disorders of tendon, left shoulder: Secondary | ICD-10-CM | POA: Diagnosis not present

## 2024-07-24 ENCOUNTER — Encounter (HOSPITAL_BASED_OUTPATIENT_CLINIC_OR_DEPARTMENT_OTHER): Payer: Self-pay | Admitting: Orthopaedic Surgery

## 2024-07-24 ENCOUNTER — Ambulatory Visit (HOSPITAL_BASED_OUTPATIENT_CLINIC_OR_DEPARTMENT_OTHER): Payer: Self-pay | Admitting: Orthopaedic Surgery

## 2024-07-24 ENCOUNTER — Other Ambulatory Visit (HOSPITAL_BASED_OUTPATIENT_CLINIC_OR_DEPARTMENT_OTHER): Payer: Self-pay

## 2024-07-24 ENCOUNTER — Ambulatory Visit (HOSPITAL_BASED_OUTPATIENT_CLINIC_OR_DEPARTMENT_OTHER): Admitting: Orthopaedic Surgery

## 2024-07-24 DIAGNOSIS — M75122 Complete rotator cuff tear or rupture of left shoulder, not specified as traumatic: Secondary | ICD-10-CM

## 2024-07-24 MED ORDER — ACETAMINOPHEN 500 MG PO TABS
500.0000 mg | ORAL_TABLET | Freq: Three times a day (TID) | ORAL | 0 refills | Status: AC
  Filled 2024-07-24: qty 30, 10d supply, fill #0

## 2024-07-24 MED ORDER — OXYCODONE HCL 5 MG PO TABS
5.0000 mg | ORAL_TABLET | ORAL | 0 refills | Status: AC | PRN
  Filled 2024-07-24: qty 15, 3d supply, fill #0

## 2024-07-24 MED ORDER — ASPIRIN 325 MG PO TBEC
325.0000 mg | DELAYED_RELEASE_TABLET | Freq: Every day | ORAL | 0 refills | Status: DC
  Filled 2024-07-24: qty 14, 14d supply, fill #0

## 2024-07-24 NOTE — Progress Notes (Signed)
 Post Operative Evaluation    Procedure/Date of Surgery: Left shoulder rotator cuff repair 11/19  Interval History:   Presents today status post the above procedure.  Unfortunately he is still having continued pain despite injections following his arthroscopic intervention.  He is here today for MRI discussion  PMH/PSH/Family History/Social History/Meds/Allergies:    Past Medical History:  Diagnosis Date   Acid reflux    C. difficile colitis    DVT (deep venous thrombosis) (HCC) 2014   left leg   Factor 5 Leiden mutation, heterozygous (HCC)    Heart murmur    High blood cholesterol level    Hypertension    Right shoulder pain    Past Surgical History:  Procedure Laterality Date   AORTIC ARCH ANGIOGRAPHY N/A 10/07/2022   Procedure: AORTIC ARCH ANGIOGRAPHY;  Surgeon: Magda Debby SAILOR, MD;  Location: [facility];  Service: Cardiovascular;  Laterality: N/A;   APPENDECTOMY     bone graft  right great toe     for bunion   PERIPHERAL VASCULAR INTERVENTION Left 10/07/2022   Procedure: PERIPHERAL VASCULAR INTERVENTION;  Surgeon: Magda Debby SAILOR, MD;  Location: [facility];  Service: Cardiovascular;  Laterality: Left;  Subclavian   SHOULDER ARTHROSCOPY WITH ROTATOR CUFF REPAIR Left 10/24/2023   Procedure: LEFT SHOULDER ARTHROSCOPY ROTATOR CUFF REPAIR;  Surgeon: Genelle Standing, MD;  Location: [facility];  Service: Orthopedics;  Laterality: Left;   SHOULDER OPEN ROTATOR CUFF REPAIR Right 05/14/2013   Procedure: ROTATOR CUFF REPAIR SHOULDER OPEN;  Surgeon: Lemond Stable, MD;  Location: [facility];  Service: Orthopedics;  Laterality: Right;   VASECTOMY     Social History   Socioeconomic History   Marital status: Married    Spouse name: Not on file   Number of children: Not on file   Years of education: Not on file   Highest education level: Not on file  Occupational History   Not on file  Tobacco Use   Smoking status:  Former    Current packs/day: 0.00    Types: Cigarettes    Quit date: 06/03/2013    Years since quitting: 11.1    Passive exposure: Never   Smokeless tobacco: Never  Vaping Use   Vaping status: Never Used  Substance and Sexual Activity   Alcohol use: No   Drug use: No   Sexual activity: Yes    Birth control/protection: Other-see comments    Comment: had vasectomy  Other Topics Concern   Not on file  Social History Narrative   Not on file   Social Drivers of Health   Financial Resource Strain: Not on file  Food Insecurity: Not on file  Transportation Needs: Not on file  Physical Activity: Not on file  Stress: Not on file  Social Connections: Not on file   Family History  Problem Relation Age of Onset   Clotting disorder Mother    Diabetes Paternal Grandmother    Allergies  Allergen Reactions   Benicar [Olmesartan] Other (See Comments)    REACTION: Unable to regulate blood pressure with this medication--caused BP levels to drop too low.   Current Outpatient Medications  Medication Sig Dispense Refill   acetaminophen  (TYLENOL ) 500 MG tablet Take 1 tablet (500 mg total) by mouth every 8 (eight) hours for 10 days. 30 tablet 0  aspirin  EC 325 MG tablet Take 1 tablet (325 mg total) by mouth daily. 14 tablet 0   oxyCODONE  (ROXICODONE ) 5 MG immediate release tablet Take 1 tablet (5 mg total) by mouth every 4 (four) hours as needed for severe pain (pain score 7-10) or breakthrough pain. 15 tablet 0   ascorbic acid (VITAMIN C) 500 MG tablet Take 500 mg by mouth daily.     aspirin  EC 81 MG tablet Take 81 mg by mouth daily. Swallow whole.     atenolol (TENORMIN) 25 MG tablet Take 25 mg by mouth daily.     atorvastatin  (LIPITOR) 40 MG tablet Take 1 tablet (40 mg total) by mouth daily. 90 tablet 0   atorvastatin  (LIPITOR) 40 MG tablet TAKE 1 TABLET BY MOUTH EVERY DAY 90 tablet 0   Atorvastatin  Calcium  (ATORVALIQ) 20 MG/5ML SUSP Take by mouth.     clopidogrel  (PLAVIX ) 75 MG tablet  TAKE 1 TABLET BY MOUTH EVERY DAY 90 tablet 3   Clopidogrel  Bisulfate (PLAVIX  PO) Take by mouth.     ezetimibe (ZETIA) 10 MG tablet Take 10 mg by mouth daily.     glimepiride (AMARYL) 2 MG tablet Take 2 mg by mouth daily with breakfast.     HYDROcodone -acetaminophen  (NORCO) 10-325 MG tablet Take 1 tablet by mouth every 6 (six) hours.     oxyCODONE  (ROXICODONE ) 5 MG immediate release tablet Take 1 tablet (5 mg total) by mouth every 4 (four) hours as needed for severe pain (pain score 7-10) or breakthrough pain. 15 tablet 0   sildenafil (VIAGRA) 100 MG tablet Take 50-100 mg by mouth daily as needed for erectile dysfunction.     zolpidem  (AMBIEN ) 10 MG tablet Take 10 mg by mouth at bedtime.     No current facility-administered medications for this visit.   No results found.  Review of Systems:   A ROS was performed including pertinent positives and negatives as documented in the HPI.   Musculoskeletal Exam:    There were no vitals taken for this visit.  Left shoulder incisions are well-appearing without erythema or drainage.  In the spine position he can actively forward flex to 120 degrees with 45 external rotation at side.  Internal rotation deferred today.  Distal neurosensory exam is intact  Imaging:    MRI left shoulder: There is evidence of full-thickness cartilage loss involving superior and posterior glenoid.  Rotator cuff repair looks intact.  I personally reviewed and interpreted the radiographs.   Assessment:   Status post left shoulder arthroscopic rotator cuff repair with residual pain.  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  At this time he did not get significant relief from his arthroscopic debridement and as result I did discuss additional options.  Given the fact that he is still quite limited following 2 additional injections we did discuss  that ultimately I would recommend reverse shoulder arthroplasty.  I do believe this would ideally be what is needed to address his chondral loss.  I did discuss the risk and limitations as well as associated recovery timeframe.  After discussion he would like to proceed Plan :    - Plan for left shoulder reverse shoulder arthroplasty   After a lengthy discussion of treatment options, including risks, benefits, alternatives, complications of surgical and nonsurgical conservative options, the patient elected surgical repair.   The patient  is aware of  the material risks  and complications including, but not limited to injury to adjacent structures, neurovascular injury, infection, numbness, bleeding, implant failure, thermal burns, stiffness, persistent pain, failure to heal, disease transmission from allograft, need for further surgery, dislocation, anesthetic risks, blood clots, risks of death,and others. The probabilities of surgical success and failure discussed with patient given their particular co-morbidities.The time and nature of expected rehabilitation and recovery was discussed.The patient's questions were all answered preoperatively.  No barriers to understanding were noted. I explained the natural history of the disease process and Rx rationale.  I explained to the patient what I considered to be reasonable expectations given their personal situation.  The final treatment plan was arrived at through a shared patient decision making process model.    I personally saw and evaluated the patient, and participated in the management and treatment plan.  Elspeth Parker, MD Attending Physician, Orthopedic Surgery  This document was dictated using Dragon voice recognition software. A reasonable attempt at proof reading has been made to minimize errors.

## 2024-07-29 DIAGNOSIS — M47816 Spondylosis without myelopathy or radiculopathy, lumbar region: Secondary | ICD-10-CM | POA: Diagnosis not present

## 2024-07-29 DIAGNOSIS — G8929 Other chronic pain: Secondary | ICD-10-CM | POA: Diagnosis not present

## 2024-07-29 DIAGNOSIS — M545 Low back pain, unspecified: Secondary | ICD-10-CM | POA: Diagnosis not present

## 2024-07-29 DIAGNOSIS — Z5181 Encounter for therapeutic drug level monitoring: Secondary | ICD-10-CM | POA: Diagnosis not present

## 2024-07-29 DIAGNOSIS — Z79899 Other long term (current) drug therapy: Secondary | ICD-10-CM | POA: Diagnosis not present

## 2024-08-07 ENCOUNTER — Other Ambulatory Visit (HOSPITAL_COMMUNITY): Payer: Self-pay

## 2024-08-07 DIAGNOSIS — G8929 Other chronic pain: Secondary | ICD-10-CM | POA: Diagnosis not present

## 2024-08-07 DIAGNOSIS — I10 Essential (primary) hypertension: Secondary | ICD-10-CM | POA: Diagnosis not present

## 2024-08-07 DIAGNOSIS — M545 Low back pain, unspecified: Secondary | ICD-10-CM

## 2024-08-07 DIAGNOSIS — Z7689 Persons encountering health services in other specified circumstances: Secondary | ICD-10-CM | POA: Diagnosis not present

## 2024-08-07 DIAGNOSIS — E782 Mixed hyperlipidemia: Secondary | ICD-10-CM | POA: Diagnosis not present

## 2024-08-07 DIAGNOSIS — E1165 Type 2 diabetes mellitus with hyperglycemia: Secondary | ICD-10-CM | POA: Diagnosis not present

## 2024-08-08 ENCOUNTER — Ambulatory Visit
Admission: RE | Admit: 2024-08-08 | Discharge: 2024-08-08 | Disposition: A | Discharge status: Home / Self Care | Source: Ambulatory Visit | Attending: Orthopaedic Surgery | Admitting: Orthopaedic Surgery

## 2024-08-08 DIAGNOSIS — M75122 Complete rotator cuff tear or rupture of left shoulder, not specified as traumatic: Secondary | ICD-10-CM

## 2024-08-08 DIAGNOSIS — M19012 Primary osteoarthritis, left shoulder: Secondary | ICD-10-CM | POA: Diagnosis not present

## 2024-08-15 DIAGNOSIS — E1165 Type 2 diabetes mellitus with hyperglycemia: Secondary | ICD-10-CM | POA: Diagnosis not present

## 2024-08-15 DIAGNOSIS — E782 Mixed hyperlipidemia: Secondary | ICD-10-CM | POA: Diagnosis not present

## 2024-08-15 DIAGNOSIS — Z8639 Personal history of other endocrine, nutritional and metabolic disease: Secondary | ICD-10-CM | POA: Diagnosis not present

## 2024-08-15 DIAGNOSIS — Z125 Encounter for screening for malignant neoplasm of prostate: Secondary | ICD-10-CM | POA: Diagnosis not present

## 2024-08-20 ENCOUNTER — Ambulatory Visit: Admitting: Nutrition

## 2024-08-21 DIAGNOSIS — E782 Mixed hyperlipidemia: Secondary | ICD-10-CM | POA: Diagnosis not present

## 2024-08-21 DIAGNOSIS — Z23 Encounter for immunization: Secondary | ICD-10-CM | POA: Diagnosis not present

## 2024-08-21 DIAGNOSIS — Z955 Presence of coronary angioplasty implant and graft: Secondary | ICD-10-CM | POA: Diagnosis not present

## 2024-08-21 DIAGNOSIS — I10 Essential (primary) hypertension: Secondary | ICD-10-CM | POA: Diagnosis not present

## 2024-08-21 DIAGNOSIS — G8929 Other chronic pain: Secondary | ICD-10-CM | POA: Diagnosis not present

## 2024-08-21 DIAGNOSIS — E1165 Type 2 diabetes mellitus with hyperglycemia: Secondary | ICD-10-CM | POA: Diagnosis not present

## 2024-08-22 ENCOUNTER — Ambulatory Visit: Admitting: Nutrition

## 2024-08-27 DIAGNOSIS — M545 Low back pain, unspecified: Secondary | ICD-10-CM | POA: Diagnosis not present

## 2024-08-27 DIAGNOSIS — G8929 Other chronic pain: Secondary | ICD-10-CM | POA: Diagnosis not present

## 2024-08-27 DIAGNOSIS — M47816 Spondylosis without myelopathy or radiculopathy, lumbar region: Secondary | ICD-10-CM | POA: Diagnosis not present

## 2024-09-17 DIAGNOSIS — M47816 Spondylosis without myelopathy or radiculopathy, lumbar region: Secondary | ICD-10-CM | POA: Diagnosis not present

## 2024-09-17 DIAGNOSIS — M47817 Spondylosis without myelopathy or radiculopathy, lumbosacral region: Secondary | ICD-10-CM | POA: Diagnosis not present

## 2024-09-17 DIAGNOSIS — M48061 Spinal stenosis, lumbar region without neurogenic claudication: Secondary | ICD-10-CM | POA: Diagnosis not present

## 2024-09-17 DIAGNOSIS — M5137 Other intervertebral disc degeneration, lumbosacral region with discogenic back pain only: Secondary | ICD-10-CM | POA: Diagnosis not present

## 2024-09-17 DIAGNOSIS — M5136 Other intervertebral disc degeneration, lumbar region with discogenic back pain only: Secondary | ICD-10-CM | POA: Diagnosis not present

## 2024-09-23 ENCOUNTER — Other Ambulatory Visit: Payer: Self-pay | Admitting: Orthopaedic Surgery

## 2024-09-23 ENCOUNTER — Encounter (HOSPITAL_BASED_OUTPATIENT_CLINIC_OR_DEPARTMENT_OTHER): Payer: Self-pay | Admitting: Orthopaedic Surgery

## 2024-09-23 MED ORDER — TRAMADOL HCL 50 MG PO TABS: 50.0000 mg | ORAL_TABLET | Freq: Four times a day (QID) | ORAL | 1 refills | Status: AC | PRN

## 2024-10-03 ENCOUNTER — Encounter (HOSPITAL_BASED_OUTPATIENT_CLINIC_OR_DEPARTMENT_OTHER): Payer: Self-pay | Admitting: Orthopaedic Surgery

## 2024-10-07 ENCOUNTER — Encounter: Payer: Self-pay | Admitting: Radiology

## 2024-10-10 ENCOUNTER — Ambulatory Visit: Admitting: Nutrition

## 2024-10-11 NOTE — Telephone Encounter (Signed)
 I spoke with the patient on 10/31 and advised him there is no other availability for an earlier surgical date. Patient understood.

## 2024-10-22 ENCOUNTER — Ambulatory Visit: Admitting: Physician Assistant

## 2024-10-22 DIAGNOSIS — M47816 Spondylosis without myelopathy or radiculopathy, lumbar region: Secondary | ICD-10-CM | POA: Diagnosis not present

## 2024-10-22 DIAGNOSIS — M545 Low back pain, unspecified: Secondary | ICD-10-CM | POA: Diagnosis not present

## 2024-10-22 DIAGNOSIS — G8929 Other chronic pain: Secondary | ICD-10-CM | POA: Diagnosis not present

## 2024-10-27 ENCOUNTER — Other Ambulatory Visit: Payer: Self-pay | Admitting: Vascular Surgery

## 2024-10-30 ENCOUNTER — Telehealth (HOSPITAL_BASED_OUTPATIENT_CLINIC_OR_DEPARTMENT_OTHER): Payer: Self-pay | Admitting: Orthopaedic Surgery

## 2024-10-30 ENCOUNTER — Other Ambulatory Visit (HOSPITAL_BASED_OUTPATIENT_CLINIC_OR_DEPARTMENT_OTHER): Payer: Self-pay

## 2024-10-30 ENCOUNTER — Encounter (HOSPITAL_BASED_OUTPATIENT_CLINIC_OR_DEPARTMENT_OTHER): Payer: Self-pay | Admitting: Orthopaedic Surgery

## 2024-10-30 ENCOUNTER — Ambulatory Visit (HOSPITAL_BASED_OUTPATIENT_CLINIC_OR_DEPARTMENT_OTHER): Admitting: Orthopaedic Surgery

## 2024-10-30 DIAGNOSIS — M75122 Complete rotator cuff tear or rupture of left shoulder, not specified as traumatic: Secondary | ICD-10-CM | POA: Diagnosis not present

## 2024-10-30 MED ORDER — OXYCODONE HCL 5 MG PO TABS
5.0000 mg | ORAL_TABLET | ORAL | 0 refills | Status: DC | PRN
  Filled 2024-10-30: qty 25, 5d supply, fill #0

## 2024-10-30 MED ORDER — ACETAMINOPHEN 500 MG PO TABS
500.0000 mg | ORAL_TABLET | Freq: Three times a day (TID) | ORAL | 0 refills | Status: AC
  Filled 2024-10-30: qty 30, 10d supply, fill #0

## 2024-10-30 NOTE — Telephone Encounter (Signed)
 Patient needs a note for when he goes out for surgery and how long he will be out send to mychart

## 2024-10-30 NOTE — Progress Notes (Signed)
 Post Operative Evaluation    Procedure/Date of Surgery: Left shoulder rotator cuff repair 11/19  Interval History:   Presents today status post the above procedure.  Unfortunately he is still having continued pain despite injections following his arthroscopic intervention.  He is here today for MRI discussion  PMH/PSH/Family History/Social History/Meds/Allergies:    Past Medical History:  Diagnosis Date   Acid reflux    C. difficile colitis    DVT (deep venous thrombosis) (HCC) 2014   left leg   Factor 5 Leiden mutation, heterozygous    Heart murmur    High blood cholesterol level    Hypertension    Right shoulder pain    Past Surgical History:  Procedure Laterality Date   AORTIC ARCH ANGIOGRAPHY N/A 10/07/2022   Procedure: AORTIC ARCH ANGIOGRAPHY;  Surgeon: Magda Debby SAILOR, MD;  Location: [facility];  Service: Cardiovascular;  Laterality: N/A;   APPENDECTOMY     bone graft  right great toe     for bunion   PERIPHERAL VASCULAR INTERVENTION Left 10/07/2022   Procedure: PERIPHERAL VASCULAR INTERVENTION;  Surgeon: Magda Debby SAILOR, MD;  Location: [facility];  Service: Cardiovascular;  Laterality: Left;  Subclavian   SHOULDER ARTHROSCOPY WITH ROTATOR CUFF REPAIR Left 10/24/2023   Procedure: LEFT SHOULDER ARTHROSCOPY ROTATOR CUFF REPAIR;  Surgeon: Genelle Standing, MD;  Location: [facility];  Service: Orthopedics;  Laterality: Left;   SHOULDER OPEN ROTATOR CUFF REPAIR Right 05/14/2013   Procedure: ROTATOR CUFF REPAIR SHOULDER OPEN;  Surgeon: Lemond Stable, MD;  Location: [facility];  Service: Orthopedics;  Laterality: Right;   VASECTOMY     Social History   Socioeconomic History   Marital status: Married    Spouse name: Not on file   Number of children: Not on file   Years of education: Not on file   Highest education level: Not on file  Occupational History   Not on file  Tobacco Use   Smoking status: Former     Current packs/day: 0.00    Types: Cigarettes    Quit date: 06/03/2013    Years since quitting: 11.4    Passive exposure: Never   Smokeless tobacco: Never  Vaping Use   Vaping status: Never Used  Substance and Sexual Activity   Alcohol use: No   Drug use: No   Sexual activity: Yes    Birth control/protection: Other-see comments    Comment: had vasectomy  Other Topics Concern   Not on file  Social History Narrative   Not on file   Social Drivers of Health   Financial Resource Strain: Low Risk  (10/21/2024)   Received from [facility]   Overall Financial Resource Strain (CARDIA)    How hard is it for you to pay for the very basics like food, housing, medical care, and heating?: Not hard at all  Food Insecurity: No Food Insecurity (10/21/2024)   Received from [facility]   Hunger Vital Sign    Within the past 12 months, you worried that your food would run out before you got the money to buy more.: Never true    Within the past 12 months, the food you bought just didn't last and you didn't have money to get more.: Never true  Transportation Needs: No Transportation Needs (10/21/2024)  Received from [facility] - Transportation    In the past 12 months, has lack of transportation kept you from medical appointments or from getting medications?: No    In the past 12 months, has lack of transportation kept you from meetings, work, or from getting things needed for daily living?: No  Physical Activity: Insufficiently Active (10/21/2024)   Received from [facility]   Exercise Vital Sign    On average, how many days per week do you engage in moderate to strenuous exercise (like a brisk walk)?: 2 days    On average, how many minutes do you engage in exercise at this level?: 30 min  Stress: No Stress Concern Present (10/21/2024)   Received from [facility] - Occupational Stress Questionnaire    Do you feel stress - tense,  restless, nervous, or anxious, or unable to sleep at night because your mind is troubled all the time - these days?: Not at all  Social Connections: Moderately Integrated (10/21/2024)   Received from [facility]   Social Network    How would you rate your social network (family, work, friends)?: Adequate participation with social networks   Family History  Problem Relation Age of Onset   Clotting disorder Mother    Diabetes Paternal Grandmother    Allergies  Allergen Reactions   Benicar [Olmesartan] Other (See Comments)    REACTION: Unable to regulate blood pressure with this medication--caused BP levels to drop too low.   Current Outpatient Medications  Medication Sig Dispense Refill   acetaminophen  (TYLENOL ) 500 MG tablet Take 1 tablet (500 mg total) by mouth every 8 (eight) hours for 10 days. 30 tablet 0   oxyCODONE  (ROXICODONE ) 5 MG immediate release tablet Take 1 tablet (5 mg total) by mouth every 4 (four) hours as needed for severe pain (pain score 7-10) or breakthrough pain. 25 tablet 0   ascorbic acid (VITAMIN C) 500 MG tablet Take 500 mg by mouth daily.     aspirin  EC 325 MG tablet Take 1 tablet (325 mg total) by mouth daily. 14 tablet 0   aspirin  EC 81 MG tablet Take 81 mg by mouth daily. Swallow whole.     atenolol (TENORMIN) 25 MG tablet Take 25 mg by mouth daily.     atorvastatin  (LIPITOR) 40 MG tablet Take 1 tablet (40 mg total) by mouth daily. 90 tablet 0   atorvastatin  (LIPITOR) 40 MG tablet TAKE 1 TABLET BY MOUTH EVERY DAY 90 tablet 0   Atorvastatin  Calcium  (ATORVALIQ) 20 MG/5ML SUSP Take by mouth.     clopidogrel  (PLAVIX ) 75 MG tablet TAKE 1 TABLET BY MOUTH EVERY DAY 90 tablet 3   Clopidogrel  Bisulfate (PLAVIX  PO) Take by mouth.     ezetimibe (ZETIA) 10 MG tablet Take 10 mg by mouth daily.     glimepiride (AMARYL) 2 MG tablet Take 2 mg by mouth daily with breakfast.     HYDROcodone -acetaminophen  (NORCO) 10-325 MG tablet Take 1 tablet by mouth every 6 (six) hours.      oxyCODONE  (ROXICODONE ) 5 MG immediate release tablet Take 1 tablet (5 mg total) by mouth every 4 (four) hours as needed for severe pain (pain score 7-10) or breakthrough pain. 15 tablet 0   oxyCODONE  (ROXICODONE ) 5 MG immediate release tablet Take 1 tablet (5 mg total) by mouth every 4 (four) hours as needed for severe pain (pain score 7-10) or breakthrough pain. 15 tablet 0   sildenafil (  VIAGRA) 100 MG tablet Take 50-100 mg by mouth daily as needed for erectile dysfunction.     traMADol  (ULTRAM ) 50 MG tablet Take 1 tablet (50 mg total) by mouth every 6 (six) hours as needed. 30 tablet 1   zolpidem  (AMBIEN ) 10 MG tablet Take 10 mg by mouth at bedtime.     No current facility-administered medications for this visit.   No results found.  Review of Systems:   A ROS was performed including pertinent positives and negatives as documented in the HPI.   Musculoskeletal Exam:    There were no vitals taken for this visit.  Left shoulder incisions are well-appearing without erythema or drainage.  In the spine position he can actively forward flex to 120 degrees with 45 external rotation at side.  Internal rotation deferred today.  Distal neurosensory exam is intact  Imaging:    MRI left shoulder: There is evidence of full-thickness cartilage loss involving superior and posterior glenoid.  Rotator cuff repair looks intact.  I personally reviewed and interpreted the radiographs.   Assessment:   Status post left shoulder arthroscopic rotator cuff repair with residual pain.  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  At this time he did not get significant relief from his arthroscopic debridement and as result I did discuss additional options.  Given the fact that he is still quite limited following 2 additional injections we did discuss that ultimately I would recommend  reverse shoulder arthroplasty.  I do believe this would ideally be what is needed to address his chondral loss.  I did discuss the risk and limitations as well as associated recovery timeframe.  After discussion he would like to proceed Plan :    - Plan for left shoulder reverse shoulder arthroplasty   After a lengthy discussion of treatment options, including risks, benefits, alternatives, complications of surgical and nonsurgical conservative options, the patient elected surgical repair.   The patient  is aware of the material risks  and complications including, but not limited to injury to adjacent structures, neurovascular injury, infection, numbness, bleeding, implant failure, thermal burns, stiffness, persistent pain, failure to heal, disease transmission from allograft, need for further surgery, dislocation, anesthetic risks, blood clots, risks of death,and others. The probabilities of surgical success and failure discussed with patient given their particular co-morbidities.The time and nature of expected rehabilitation and recovery was discussed.The patient's questions were all answered preoperatively.  No barriers to understanding were noted. I explained the natural history of the disease process and Rx rationale.  I explained to the patient what I considered to be reasonable expectations given their personal situation.  The final treatment plan was arrived at through a shared patient decision making process model.    I personally saw and evaluated the patient, and participated in the management and treatment plan.  Elspeth Parker, MD Attending Physician, Orthopedic Surgery  This document was dictated using Dragon voice recognition software. A reasonable attempt at proof reading has been made to minimize errors.

## 2024-10-30 NOTE — Telephone Encounter (Signed)
 Responded via [employer]

## 2024-11-04 ENCOUNTER — Encounter: Payer: Self-pay | Admitting: Orthopaedic Surgery

## 2024-11-04 DIAGNOSIS — G8918 Other acute postprocedural pain: Secondary | ICD-10-CM | POA: Diagnosis not present

## 2024-11-04 DIAGNOSIS — M75122 Complete rotator cuff tear or rupture of left shoulder, not specified as traumatic: Secondary | ICD-10-CM | POA: Diagnosis not present

## 2024-11-04 DIAGNOSIS — M75102 Unspecified rotator cuff tear or rupture of left shoulder, not specified as traumatic: Secondary | ICD-10-CM | POA: Diagnosis not present

## 2024-11-04 NOTE — Progress Notes (Signed)
 Date of Surgery: 11/04/2024  INDICATIONS: Preston Davis is a 53 y.o.-year-old male with left shoulder rotator cuff arthopathy.  The risk and benefits of the procedure were discussed in detail and documented in the pre-operative evaluation.   PREOPERATIVE DIAGNOSIS: 1. Left shoulder rotator cuff arthropathy  POSTOPERATIVE DIAGNOSIS: Same.  PROCEDURE: 1. Left shoulder reverse shoulder arthroplasty 2. Left shoulder biceps tenodesis  SURGEON: Preston LITTIE Parker MD  ASSISTANT: Conley Dawson, ATC  ANESTHESIA:  general  IV FLUIDS AND URINE: See anesthesia record.  ANTIBIOTICS: Ancef   ESTIMATED BLOOD LOSS: 50 mL.  DRAINS: None  CULTURES: None  COMPLICATIONS: none  DESCRIPTION OF PROCEDURE:   Patient was identified in the preoperative holding area.  Anesthesia performed an interscalene nerve block after universal timeout was performed with nursing.  Ancef  was given 1 hour prior to skin incision.    The surgical site was scrubbed with a chlorhexidine  scrub brush and alcohol.  The patient was then prepped with chlorhexidine  skin prep.  The patient was subsequently taken back to the operating room.  Anesthesia was induced. The patient was transferred to the beachchair position.  All bony prominences were padded.  Final timeout was again performed.     The bony landmarks of the shoulder were marked with a marking pen. A delto-pectoral incision was made, extending up approximately 5 inches. The wound with then irrigated with dilute betadine. Cephalic vein was identified, and an protected. This was retracted medially. Subdeltoid and subpectoral lesions were released. Neurovascular structures were carefully protected. The Gelpi retractor was used to retract the deltoid and pectoralis major.    The deltoid was retracted laterally with a Brown humeral retractor.  The conjoined tendon was identified. The cleido-pectoral fascia was excised.  The axillary nerve was palpated and carefully protected  throughout the procedure. The biceps tendon was found and tenodesed to the upper pec with # 2 Ethibond non-absorbable suture.  Proximally the biceps tendon was removed up to the joint.  The bicipital groove was used for a landmark to establish rotator cuff interval. The subscap was tagged with a #2 Ethibond.  At this point the subscapularis was peeled off from the lesser tuberosity with care to avoid dissection distally in order to protect the axillary nerve.  Once the joint was exposed the proximal humerus was delivered with external rotation and extension of the arm. The humerus was prepped initially by performing a humeral neck cut. This was done with the guide using 30 degrees of retroversion as a reference.  The head portion was removed.  A medullary sounding reamer was then used.  We subsequently placed our guidewire through the center of the humeral head using the reference guide.  This was a size 2.  Metaphyseal reamer was then used.  Finally the size 2 broach was malleted into place with excellent purchase.  A tonsil clamp was used to attempt to pull this out with very good purchase   Attention was then turned to the glenoid.  Posteriorly a large Darach retractor was used.  A 360 Degree release of the subscapularis and glenoid were done. The capsule was released from the humerus.    Glenoid retractors were placed posteriorly, superiorly behind the biceps tendon and anteriorly on the glenoid neck. A 360-degree release of the capsule was performed with cautery.  The triceps was released off the inferior tubercle of the glenoid. The axillary nerve was carefully protected with the surgeon's index finger, retracting it and using cautery.   A guidepin was placed  through the glenoid guide. The guidepin was drilled until it exited the cortex. The guidepin was over drilled. Next, the glenoid was prepared with the reamer down to cortical bone.  The central peg hole was totally within the scapular neck tested with  the probe.  The baseplate was then placed screwed securely with good purchase in position and then secured with 4 screws. In each case, they were drilled and measured and the appropriate length screw placed with excellent rigid fixation of the baseplate. The glenosphere was placed with size based based on pre-operative templating.   The humerus was then delivered and a neutral polyethylene trial was placed.  This was brought to just the level of the reduction but not completely reduced.  A 0 final poly was selected and impacted.    Appropriate tension was noted on the conjoined tendon and deltoid muscle.  Extension was stable, external and internal rotation as well.  The subscap was pulled over but as this was not able to reach comfortably decision was made not to repair in order to prevent limited in external rotation.  The wound was then irrigated. Vancomycin  powder was placed in the wound again for infection prevention.   The wound was then closed in layers with 0 Vicryl interrupted in the deep subcu followed by 2-0 Vicryl in the superficial subcu and 3-0 nylon for skin.  An Aquacel dressing was applied as well as an Veterinary surgeon.  A shoulder immobilizer was applied.     Postoperative Plan: -The patient will begin the reverse shoulder rehab protocol  -Aspirin /Plavix  will be used for 4 weeks for blood clot prevention -I will see the patient back in 2 weeks for first postoperative wound check      Preston LITTIE Parker, MD 9:04 AM

## 2024-11-05 ENCOUNTER — Ambulatory Visit

## 2024-11-07 ENCOUNTER — Ambulatory Visit (HOSPITAL_BASED_OUTPATIENT_CLINIC_OR_DEPARTMENT_OTHER): Payer: Self-pay | Admitting: Physical Therapy

## 2024-11-07 ENCOUNTER — Other Ambulatory Visit: Payer: Self-pay

## 2024-11-07 ENCOUNTER — Encounter (HOSPITAL_BASED_OUTPATIENT_CLINIC_OR_DEPARTMENT_OTHER): Payer: Self-pay | Admitting: Physical Therapy

## 2024-11-07 DIAGNOSIS — M25612 Stiffness of left shoulder, not elsewhere classified: Secondary | ICD-10-CM | POA: Insufficient documentation

## 2024-11-07 DIAGNOSIS — M6281 Muscle weakness (generalized): Secondary | ICD-10-CM | POA: Insufficient documentation

## 2024-11-07 DIAGNOSIS — M75122 Complete rotator cuff tear or rupture of left shoulder, not specified as traumatic: Secondary | ICD-10-CM | POA: Diagnosis not present

## 2024-11-07 DIAGNOSIS — M25512 Pain in left shoulder: Secondary | ICD-10-CM | POA: Diagnosis present

## 2024-11-07 NOTE — Therapy (Signed)
 OUTPATIENT PHYSICAL THERAPY UPPER EXTREMITY EVALUATION   Patient Name: Preston Davis MRN: [id number] DOB:1971/05/20, 53 y.o., male Today's Date: 11/07/2024  END OF SESSION:  PT End of Session - 11/07/24 0934     Visit Number 1    Number of Visits 24    Date for Recertification  01/23/24    Authorization Type [employer]    PT Start Time [phone]    PT Stop Time 1005    PT Time Calculation (min) 42 min    Activity Tolerance Patient tolerated treatment well    Behavior During Therapy WFL for tasks assessed/performed          Past Medical History:  Diagnosis Date   Acid reflux    C. difficile colitis    DVT (deep venous thrombosis) (HCC) 2014   left leg   Factor 5 Leiden mutation, heterozygous    Heart murmur    High blood cholesterol level    Hypertension    Right shoulder pain    Past Surgical History:  Procedure Laterality Date   AORTIC ARCH ANGIOGRAPHY N/A 10/07/2022   Procedure: AORTIC ARCH ANGIOGRAPHY;  Surgeon: Magda Debby SAILOR, MD;  Location: [facility];  Service: Cardiovascular;  Laterality: N/A;   APPENDECTOMY     bone graft  right great toe     for bunion   PERIPHERAL VASCULAR INTERVENTION Left 10/07/2022   Procedure: PERIPHERAL VASCULAR INTERVENTION;  Surgeon: Magda Debby SAILOR, MD;  Location: [facility];  Service: Cardiovascular;  Laterality: Left;  Subclavian   SHOULDER ARTHROSCOPY WITH ROTATOR CUFF REPAIR Left 10/24/2023   Procedure: LEFT SHOULDER ARTHROSCOPY ROTATOR CUFF REPAIR;  Surgeon: Genelle Standing, MD;  Location: [facility];  Service: Orthopedics;  Laterality: Left;   SHOULDER OPEN ROTATOR CUFF REPAIR Right 05/14/2013   Procedure: ROTATOR CUFF REPAIR SHOULDER OPEN;  Surgeon: Lemond Stable, MD;  Location: [facility];  Service: Orthopedics;  Laterality: Right;   VASECTOMY     Patient Active Problem List   Diagnosis Date Noted   Nontraumatic complete tear of left rotator cuff 10/24/2023   HTN (hypertension) 12/16/2017   C. difficile  colitis 12/15/2017    PCP: Jerilynn Carnes, MD  REFERRING PROVIDER: Dr Standing Genelle   REFERRING DIAG: LEFT SHOULDER Reverse Total Shoulder   THERAPY DIAG:  Acute pain of left shoulder  Muscle weakness (generalized)  Stiffness of left shoulder, not elsewhere classified  Rationale for Evaluation and Treatment: Rehabilitation  ONSET DATE: DOS: 10/24/2023  SUBJECTIVE:  SUBJECTIVE STATEMENT: Patient had a left rotator cuff repair on 11/14/2023.  He continued to have pain following.  On 11/04/2024 he had a reverse right rotator cuff repair.  At this time he has no pain when he is not moving it but up to 10 out of 10 pain with movement.  He has been compliant with sling use.  PERTINENT HISTORY: Right rotator rotator cuff repair, factor V Leiden mutation Peripheral vascular intervention 11/3 HTN,  PAIN:  PAIN:  Are you having pain? Yes:  Pain level 0/10 10/10 with movement  Pain location: pain in the incision and into his pec  Pain description: sharp  Aggravating factors: any movement of the shoulder  Relieving factors: not moving it  PRECAUTIONS: None  RED FLAGS: None   WEIGHT BEARING RESTRICTIONS: Yes NWB  FALLS:  Has patient fallen in last 6 months? 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  DIAGNOSTIC FINDINGS:  Nothing at this time   PATIENT SURVEYS :  Quick dash 53/55  COGNITION: Overall cognitive status: Within functional limits for tasks assessed     SENSATION: Has  had some numbness in his hand  POSTURE: Good   UPPER EXTREMITY ROM:   Passive ROM Right eval Left eval  Shoulder flexion  40  Shoulder extension    Shoulder abduction    Shoulder adduction    Shoulder internal rotation  Can rest on stomach with mild pain  Shoulder external rotation  Neutral  Elbow flexion    Elbow extension    Wrist flexion    Wrist extension    Wrist ulnar deviation    Wrist radial deviation    Wrist pronation    Wrist supination    (Blank rows = not tested)  UPPER EXTREMITY MMT:  MMT Right eval Left eval  Shoulder flexion    Shoulder extension    Shoulder abduction    Shoulder adduction    Shoulder internal rotation    Shoulder external rotation    Middle trapezius    Lower trapezius    Elbow flexion    Elbow extension    Wrist flexion    Wrist extension    Wrist ulnar deviation    Wrist radial deviation    Wrist pronation    Wrist supination    Grip strength (lbs)    (Blank rows = not tested) not tested secondary to recent surgery      PALPATION:  No unexpected TTP    TODAY'S TREATMENT:  DATE:  Exercises - Circular Shoulder Pendulum with Table Support  - 1 x daily - 7 x weekly - 3 sets - 10 reps - Seated Elbow Flexion AAROM  - 1 x daily - 7 x weekly - 3 sets - 10 reps - Seated Scapular Retraction  - 1 x daily - 7 x weekly - 3 sets - 10 reps  Manual: PROM into flexion and ER with distraction   Bandage change- no s/s of infection; bandages removed, sutures in place, clean dry intact; tegaderm and fresh gauze replaced   PATIENT EDUCATION: Education details: HEP, symptom management , improtance of the sling  Person educated: Patient Education method: Explanation, Demonstration, Tactile cues, Verbal cues, and Handouts Education comprehension: verbalized understanding, returned demonstration, verbal  cues required, tactile cues required, and needs further education  HOME EXERCISE PROGRAM: Access Code: [email or web address] URL: [email or web address] Date: 10/31/2023 Prepared by: Alm Don  ASSESSMENT:  CLINICAL IMPRESSION: Patient is a 53 year old male status post left reverse total shoulder replacement on 11/04/2024.  At this time he presents with expected limitations in range of motion, strength, upper extremity function on his dominant side.  His pain is well-controlled.  He would benefit from skilled therapy to improve use of dominant shoulder and be able to return to work.  OBJECTIVE IMPAIRMENTS: decreased ROM, decreased strength, impaired UE functional use, and pain.   ACTIVITY LIMITATIONS: carrying, lifting, sleeping, bathing, toileting, dressing, self feeding, reach over head, and hygiene/grooming  PARTICIPATION LIMITATIONS: meal prep, cleaning, laundry, driving, shopping, community activity, occupation, and yard work  PERSONAL FACTORS: 1 comorbidity: previous right RTC tear  are also affecting patient's functional outcome.   REHAB POTENTIAL: Good  CLINICAL DECISION MAKING: Stable/uncomplicated  EVALUATION COMPLEXITY: Low  GOALS: Goals reviewed with patient? Yes  SHORT TERM GOALS: Target date: 12/12/2023    Patient will increase passive flexion to 110 degrees on the left Baseline: Goal status: INITIAL  2.  Patient will increase passive external rotation 20 degrees on the left Baseline:  Goal status: INITIAL  3.  Patient will wean out of sling per MD Baseline:  Goal status: INITIAL  4.  Patient will be independent with a base exercise program Baseline:  Goal status: INITIAL  LONG TERM GOALS: Target date: 01/23/2024    Patient will reach behind his head without increased pain in order to perform daily tasks Baseline:  Goal status: INITIAL  2.  Patient will reach overhead with 2 pound weight in order to put dishes away Baseline:  Goal status:  INITIAL  3.  Patient will return to work tasks when cleared by MD Baseline:  Goal status: INITIAL  PLAN: PT FREQUENCY: 1-2x/week  PT DURATION: 8 weeks  PLANNED INTERVENTIONS: 97110-Therapeutic exercises, 97530- Therapeutic activity, [id number]- Neuromuscular re-education, 97535- Self Care, [id number]- Manual therapy, [id number]- Aquatic Therapy, 97014- Electrical stimulation (unattended), 97035- Ultrasound, Patient/Family education, Taping, Dry Needling, DME instructions, Cryotherapy, and Moist heat  PLAN FOR NEXT SESSION:  Begin per reverse total shoulder protocol No subscap repair performed    Alm JINNY Don, PT 11/07/2024, 8:05 PM

## 2024-11-14 ENCOUNTER — Encounter (HOSPITAL_BASED_OUTPATIENT_CLINIC_OR_DEPARTMENT_OTHER): Payer: Self-pay | Admitting: Physical Therapy

## 2024-11-15 ENCOUNTER — Ambulatory Visit (INDEPENDENT_AMBULATORY_CARE_PROVIDER_SITE_OTHER): Admitting: Orthopaedic Surgery

## 2024-11-15 ENCOUNTER — Other Ambulatory Visit (HOSPITAL_BASED_OUTPATIENT_CLINIC_OR_DEPARTMENT_OTHER): Payer: Self-pay | Admitting: Orthopaedic Surgery

## 2024-11-15 ENCOUNTER — Encounter (HOSPITAL_BASED_OUTPATIENT_CLINIC_OR_DEPARTMENT_OTHER): Payer: Self-pay | Admitting: Orthopaedic Surgery

## 2024-11-15 ENCOUNTER — Ambulatory Visit (INDEPENDENT_AMBULATORY_CARE_PROVIDER_SITE_OTHER)

## 2024-11-15 ENCOUNTER — Ambulatory Visit (HOSPITAL_BASED_OUTPATIENT_CLINIC_OR_DEPARTMENT_OTHER): Payer: Self-pay | Admitting: Physical Therapy

## 2024-11-15 ENCOUNTER — Other Ambulatory Visit (HOSPITAL_BASED_OUTPATIENT_CLINIC_OR_DEPARTMENT_OTHER): Payer: Self-pay

## 2024-11-15 DIAGNOSIS — M75122 Complete rotator cuff tear or rupture of left shoulder, not specified as traumatic: Secondary | ICD-10-CM | POA: Diagnosis not present

## 2024-11-15 DIAGNOSIS — Z471 Aftercare following joint replacement surgery: Secondary | ICD-10-CM | POA: Diagnosis not present

## 2024-11-15 DIAGNOSIS — Z96612 Presence of left artificial shoulder joint: Secondary | ICD-10-CM | POA: Diagnosis not present

## 2024-11-15 DIAGNOSIS — M25512 Pain in left shoulder: Secondary | ICD-10-CM

## 2024-11-15 DIAGNOSIS — M25612 Stiffness of left shoulder, not elsewhere classified: Secondary | ICD-10-CM

## 2024-11-15 DIAGNOSIS — M6281 Muscle weakness (generalized): Secondary | ICD-10-CM

## 2024-11-15 NOTE — Progress Notes (Signed)
 Post Operative Evaluation    Procedure/Date of Surgery: Left shoulder reverse shoulder arthroplasty 11/19  Interval History:    Presents 2 weeks status post above procedure.  Overall he is doing extremely well.  Overhead range of motion is coming along nicely   PMH/PSH/Family History/Social History/Meds/Allergies:    Past Medical History:  Diagnosis Date   Acid reflux    C. difficile colitis    DVT (deep venous thrombosis) (HCC) 2014   left leg   Factor 5 Leiden mutation, heterozygous    Heart murmur    High blood cholesterol level    Hypertension    Right shoulder pain    Past Surgical History:  Procedure Laterality Date   AORTIC ARCH ANGIOGRAPHY N/A 10/07/2022   Procedure: AORTIC ARCH ANGIOGRAPHY;  Surgeon: Magda Debby SAILOR, MD;  Location: [facility];  Service: Cardiovascular;  Laterality: N/A;   APPENDECTOMY     bone graft  right great toe     for bunion   PERIPHERAL VASCULAR INTERVENTION Left 10/07/2022   Procedure: PERIPHERAL VASCULAR INTERVENTION;  Surgeon: Magda Debby SAILOR, MD;  Location: [facility];  Service: Cardiovascular;  Laterality: Left;  Subclavian   SHOULDER ARTHROSCOPY WITH ROTATOR CUFF REPAIR Left 10/24/2023   Procedure: LEFT SHOULDER ARTHROSCOPY ROTATOR CUFF REPAIR;  Surgeon: Genelle Standing, MD;  Location: [facility];  Service: Orthopedics;  Laterality: Left;   SHOULDER OPEN ROTATOR CUFF REPAIR Right 05/14/2013   Procedure: ROTATOR CUFF REPAIR SHOULDER OPEN;  Surgeon: Lemond Stable, MD;  Location: [facility];  Service: Orthopedics;  Laterality: Right;   VASECTOMY     Social History   Socioeconomic History   Marital status: Married    Spouse name: Not on file   Number of children: Not on file   Years of education: Not on file   Highest education level: Not on file  Occupational History   Not on file  Tobacco Use   Smoking status: Former    Current packs/day: 0.00    Average packs/day:  1.0 packs/day    Types: Cigarettes    Quit date: 06/03/2013    Years since quitting: 11.4    Passive exposure: Never   Smokeless tobacco: Never  Vaping Use   Vaping status: Never Used  Substance and Sexual Activity   Alcohol use: No   Drug use: No   Sexual activity: Yes    Birth control/protection: Other-see comments    Comment: had vasectomy  Other Topics Concern   Not on file  Social History Narrative   Not on file   Social Drivers of Health   Tobacco Use: Medium Risk (11/07/2024)   Patient History    Smoking Tobacco Use: Former    Smokeless Tobacco Use: Never    Passive Exposure: Never  Physicist, Medical Strain: Low Risk (10/21/2024)   Received from [facility]   Overall Financial Resource Strain (CARDIA)    How hard is it for you to pay for the very basics like food, housing, medical care, and heating?: Not hard at all  Food Insecurity: No Food Insecurity (10/21/2024)   Received from [facility]   Epic    Within the past 12 months, you worried that your food would run out before you got the money to buy more.: Never true    Within  the past 12 months, the food you bought just didn't last and you didn't have money to get more.: Never true  Transportation Needs: No Transportation Needs (10/21/2024)   Received from [facility]    In the past 12 months, has lack of transportation kept you from medical appointments or from getting medications?: No    In the past 12 months, has lack of transportation kept you from meetings, work, or from getting things needed for daily living?: No  Physical Activity: Insufficiently Active (10/21/2024)   Received from [facility]   Exercise Vital Sign    On average, how many days per week do you engage in moderate to strenuous exercise (like a brisk walk)?: 2 days    On average, how many minutes do you engage in exercise at this level?: 30 min  Stress: No Stress Concern Present (10/21/2024)   Received from [facility] - Occupational Stress Questionnaire    Do you feel stress - tense, restless, nervous, or anxious, or unable to sleep at night because your mind is troubled all the time - these days?: Not at all  Social Connections: Moderately Integrated (10/21/2024)   Received from [facility]   Social Network    How would you rate your social network (family, work, friends)?: Adequate participation with social networks  Depression (PHQ2-9): Not on file  Alcohol Screen: Not on file  Housing: Low Risk (10/21/2024)   Received from [facility]    In the last 12 months, was there a time when you were not able to pay the mortgage or rent on time?: No    In the past 12 months, how many times have you moved where you were living?: 0    At any time in the past 12 months, were you homeless or living in a shelter (including now)?: No  Utilities: Not At Risk (10/21/2024)   Received from [facility]    In the past 12 months has the electric, gas, oil, or water  company threatened to shut off services in your home?: No  Health Literacy: Not on file   Family History  Problem Relation Age of Onset   Clotting disorder Mother    Diabetes Paternal Grandmother    Allergies[1] Current Outpatient Medications  Medication Sig Dispense Refill   ascorbic acid (VITAMIN C) 500 MG tablet Take 500 mg by mouth daily.     aspirin  EC 325 MG tablet Take 1 tablet (325 mg total) by mouth daily. 14 tablet 0   aspirin  EC 81 MG tablet Take 81 mg by mouth daily. Swallow whole.     atenolol (TENORMIN) 25 MG tablet Take 25 mg by mouth daily.     atorvastatin  (LIPITOR) 40 MG tablet Take 1 tablet (40 mg total) by mouth daily. 90 tablet 0   atorvastatin  (LIPITOR) 40 MG tablet TAKE 1 TABLET BY MOUTH EVERY DAY 90 tablet 0   Atorvastatin  Calcium  (ATORVALIQ) 20 MG/5ML SUSP Take by mouth.     clopidogrel  (PLAVIX ) 75 MG tablet TAKE 1 TABLET BY MOUTH EVERY DAY 90 tablet 3   Clopidogrel   Bisulfate (PLAVIX  PO) Take by mouth.     ezetimibe (ZETIA) 10 MG tablet Take 10 mg by mouth daily.     glimepiride (AMARYL) 2 MG tablet Take 2 mg by mouth daily with breakfast.     HYDROcodone -acetaminophen  (NORCO) 10-325 MG tablet Take 1 tablet by mouth every 6 (  six) hours.     oxyCODONE  (ROXICODONE ) 5 MG immediate release tablet Take 1 tablet (5 mg total) by mouth every 4 (four) hours as needed for severe pain (pain score 7-10) or breakthrough pain. 15 tablet 0   oxyCODONE  (ROXICODONE ) 5 MG immediate release tablet Take 1 tablet (5 mg total) by mouth every 4 (four) hours as needed for severe pain (pain score 7-10) or breakthrough pain. 15 tablet 0   oxyCODONE  (ROXICODONE ) 5 MG immediate release tablet Take 1 tablet (5 mg total) by mouth every 4 (four) hours as needed for severe pain (pain score 7-10) or breakthrough pain. 25 tablet 0   sildenafil (VIAGRA) 100 MG tablet Take 50-100 mg by mouth daily as needed for erectile dysfunction.     traMADol  (ULTRAM ) 50 MG tablet Take 1 tablet (50 mg total) by mouth every 6 (six) hours as needed. 30 tablet 1   zolpidem  (AMBIEN ) 10 MG tablet Take 10 mg by mouth at bedtime.     No current facility-administered medications for this visit.   No results found.  Review of Systems:   A ROS was performed including pertinent positives and negatives as documented in the HPI.   Musculoskeletal Exam:    There were no vitals taken for this visit.  Left shoulder incisions well-appearing without erythema or drainage.  Active forward elevation is to 90 degrees with external Tatian at side to 20 degrees internal rotation deferred today just neurosensory exams intact  Imaging:    3 views left shoulder: Status post reverse shoulder arthroplasty without evidence of complication  I personally reviewed and interpreted the radiographs.   Assessment:   2 weeks status post left shoulder reverse shoulder arthroplasty without evidence complication overall doing  extremely well.  I will plan to see him back in 4 weeks for reassessment  Plan :    - Return to clinic 4 weeks for reassessment      I personally saw and evaluated the patient, and participated in the management and treatment plan.  Elspeth Parker, MD Attending Physician, Orthopedic Surgery  This document was dictated using Dragon voice recognition software. A reasonable attempt at proof reading has been made to minimize errors.    [1]  Allergies Allergen Reactions   Benicar [Olmesartan] Other (See Comments)    REACTION: Unable to regulate blood pressure with this medication--caused BP levels to drop too low.

## 2024-11-15 NOTE — Therapy (Signed)
 OUTPATIENT PHYSICAL THERAPY UPPER EXTREMITY EVALUATION   Patient Name: Preston Davis MRN: [id number] DOB:10-08-1971, 53 y.o., male Today's Date: 11/17/2024  END OF SESSION:  PT End of Session - 11/17/24 0935     Visit Number 2    Number of Visits 24    Date for Recertification  01/02/25    Authorization Type [employer]    PT Start Time 1100    PT Stop Time 1140    PT Time Calculation (min) 40 min    Activity Tolerance Patient tolerated treatment well    Behavior During Therapy WFL for tasks assessed/performed           Past Medical History:  Diagnosis Date   Acid reflux    C. difficile colitis    DVT (deep venous thrombosis) (HCC) 2014   left leg   Factor 5 Leiden mutation, heterozygous    Heart murmur    High blood cholesterol level    Hypertension    Right shoulder pain    Past Surgical History:  Procedure Laterality Date   AORTIC ARCH ANGIOGRAPHY N/A 10/07/2022   Procedure: AORTIC ARCH ANGIOGRAPHY;  Surgeon: Magda Debby SAILOR, MD;  Location: [facility];  Service: Cardiovascular;  Laterality: N/A;   APPENDECTOMY     bone graft  right great toe     for bunion   PERIPHERAL VASCULAR INTERVENTION Left 10/07/2022   Procedure: PERIPHERAL VASCULAR INTERVENTION;  Surgeon: Magda Debby SAILOR, MD;  Location: [facility];  Service: Cardiovascular;  Laterality: Left;  Subclavian   SHOULDER ARTHROSCOPY WITH ROTATOR CUFF REPAIR Left 10/24/2023   Procedure: LEFT SHOULDER ARTHROSCOPY ROTATOR CUFF REPAIR;  Surgeon: Genelle Standing, MD;  Location: [facility];  Service: Orthopedics;  Laterality: Left;   SHOULDER OPEN ROTATOR CUFF REPAIR Right 05/14/2013   Procedure: ROTATOR CUFF REPAIR SHOULDER OPEN;  Surgeon: Lemond Stable, MD;  Location: [facility];  Service: Orthopedics;  Laterality: Right;   VASECTOMY     Patient Active Problem List   Diagnosis Date Noted   Nontraumatic complete tear of left rotator cuff 10/24/2023   HTN (hypertension) 12/16/2017   C.  difficile colitis 12/15/2017    PCP: Jerilynn Carnes, MD  REFERRING PROVIDER: Dr Standing Genelle   REFERRING DIAG: LEFT SHOULDER Reverse Total Shoulder   THERAPY DIAG:  Acute pain of left shoulder  Muscle weakness (generalized)  Stiffness of left shoulder, not elsewhere classified  Rationale for Evaluation and Treatment: Rehabilitation  ONSET DATE: DOS: 10/24/2023  SUBJECTIVE:  SUBJECTIVE STATEMENT: The patient reports has been still somewhat sore but overall feels like it is progressing.  He has worked on his exercises.  He has been compliant with sling use.  : Eval: Patient had a left rotator cuff repair on 11/14/2023.  He continued to have pain following.  On 11/04/2024 he had a reverse right rotator cuff repair.  At this time he has no pain when he is not moving it but up to 10 out of 10 pain with movement.  He has been compliant with sling use.  PERTINENT HISTORY: Right rotator rotator cuff repair, factor V Leiden mutation Peripheral vascular intervention 11/3 HTN,  PAIN:  PAIN:  Are you having pain? Yes:  Pain level 0/10 10/10 with movement  Pain location: pain in the incision and into his pec  Pain description: sharp  Aggravating factors: any movement of the shoulder  Relieving factors: not moving it  PRECAUTIONS: None  RED FLAGS: None   WEIGHT BEARING RESTRICTIONS: Yes NWB  FALLS:  Has patient fallen in last 6 months? 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  DIAGNOSTIC FINDINGS:  Nothing at this time   PATIENT SURVEYS :  Quick dash 53/55  COGNITION: Overall cognitive status: Within functional limits for tasks assessed     SENSATION: Has had some numbness in his hand  POSTURE: Good   UPPER EXTREMITY ROM:   Passive ROM Right eval Left eval  Shoulder flexion  40  Shoulder extension    Shoulder abduction    Shoulder adduction    Shoulder internal rotation  Can rest on stomach with mild pain  Shoulder external rotation  Neutral  Elbow flexion    Elbow extension    Wrist flexion    Wrist extension    Wrist ulnar deviation    Wrist radial deviation    Wrist pronation    Wrist supination    (Blank rows = not tested)  UPPER EXTREMITY MMT:  MMT Right eval Left eval  Shoulder flexion    Shoulder extension    Shoulder abduction    Shoulder adduction    Shoulder internal rotation    Shoulder external rotation    Middle trapezius    Lower trapezius    Elbow flexion    Elbow extension    Wrist flexion    Wrist extension    Wrist ulnar deviation    Wrist radial deviation    Wrist pronation    Wrist supination    Grip strength (lbs)    (Blank rows = not tested) not tested secondary to recent surgery      PALPATION:  No unexpected TTP    TODAY'S TREATMENT:  DATE:  Manual: ER and flexion stretching with distraction  Trigger point release to pec and subscap  Review of self pec release   The patient required 2 towels underneath his arm for positioning for manual therapy  There ex: Reviewed self external rotation with wand.  Patient advised not to stretch range of motion but to just move arm into external rotation.  He has adequate range of motion at this time.  Patient given exercises more for stiffness purposes.  2 x 8  Seated Scap retraction 2 x 8  Reviewed pendulum  exercises for home with technique.  Patient advised to use hips.  Self elbow flexion 3 x 10  Self flexion in the low range of motion with opposite hand 3 x 5  Eval: Manual:  ER and flexion stretching with distraction  Trigger point release to pec and subscap  Review of self pec release   There-ex:  Scap retraction to neutral 3x10  Table slide 2x10  Self flexion 2x5  Self ER with education not to stretch, just to move    Evals: Exercises - Circular Shoulder Pendulum with Table Support  - 1 x daily - 7 x weekly - 3 sets - 10 reps - Seated Elbow Flexion AAROM  - 1 x daily - 7 x weekly - 3 sets - 10 reps - Seated Scapular Retraction  - 1 x daily - 7 x weekly - 3 sets - 10 reps  Manual: PROM into flexion and ER with distraction   Bandage change- no s/s of infection; bandages removed, sutures in place, clean dry intact; tegaderm and fresh gauze replaced   PATIENT EDUCATION: Education details: HEP, symptom management , improtance of the sling  Person educated: Patient Education method: Explanation, Demonstration, Tactile cues, Verbal cues, and Handouts Education comprehension: verbalized understanding, returned demonstration, verbal cues required, tactile cues required, and needs further education  HOME EXERCISE PROGRAM: Access Code: [email or web address] URL: [email or web address] Date: 10/31/2023 Prepared by: Alm Don  ASSESSMENT:  CLINICAL IMPRESSION: The patient has a difficulty with initial positioning of the shoulder.  He was unable to lie with his shoulder in neutral position.  He had to have a slightly pushed forward to lie comfortably.  We had a position with 2 towels.  Once positioned properly he had no significant pain.  His external rotation was tight in the beginning but loosened up very well as we worked on range of motion.  We reviewed home exercises to work on.  He is advised not to force anything but just use normal movement.  He goes to see the MD after  treatment.  Eval: Patient is a 53 year old male status post left reverse total shoulder replacement on 11/04/2024.  At this time he presents with expected limitations in range of motion, strength, upper extremity function on his dominant side.  His pain is well-controlled.  He would benefit from skilled therapy to improve use of dominant shoulder and be able to return to work.  OBJECTIVE IMPAIRMENTS: decreased ROM, decreased strength, impaired UE functional use, and pain.   ACTIVITY LIMITATIONS: carrying, lifting, sleeping, bathing, toileting, dressing, self feeding, reach over head, and hygiene/grooming  PARTICIPATION LIMITATIONS: meal prep, cleaning, laundry, driving, shopping, community activity, occupation, and yard work  PERSONAL FACTORS: 1 comorbidity: previous right RTC tear  are also affecting patient's functional outcome.   REHAB POTENTIAL: Good  CLINICAL DECISION MAKING: Stable/uncomplicated  EVALUATION COMPLEXITY: Low  GOALS: Goals reviewed with patient? Yes  SHORT TERM GOALS: Target date: 12/12/2023  Patient will increase passive flexion to 110 degrees on the left Baseline: Goal status: INITIAL  2.  Patient will increase passive external rotation 20 degrees on the left Baseline:  Goal status: INITIAL  3.  Patient will wean out of sling per MD Baseline:  Goal status: INITIAL  4.  Patient will be independent with a base exercise program Baseline:  Goal status: INITIAL  LONG TERM GOALS: Target date: 01/23/2024    Patient will reach behind his head without increased pain in order to perform daily tasks Baseline:  Goal status: INITIAL  2.  Patient will reach overhead with 2 pound weight in order to put dishes away Baseline:  Goal status: INITIAL  3.  Patient will return to work tasks when cleared by MD Baseline:  Goal status: INITIAL  PLAN: PT FREQUENCY: 1-2x/week  PT DURATION: 8 weeks  PLANNED INTERVENTIONS: 97110-Therapeutic exercises, 97530-  Therapeutic activity, 97112- Neuromuscular re-education, 97535- Self Care, [id number]- Manual therapy, [phone]- Aquatic Therapy, 97014- Electrical stimulation (unattended), 97035- Ultrasound, Patient/Family education, Taping, Dry Needling, DME instructions, Cryotherapy, and Moist heat  PLAN FOR NEXT SESSION:  Begin per reverse total shoulder protocol No subscap repair performed    Alm JINNY Don, PT 11/17/2024, 9:36 AM

## 2024-11-18 ENCOUNTER — Other Ambulatory Visit (HOSPITAL_BASED_OUTPATIENT_CLINIC_OR_DEPARTMENT_OTHER): Payer: Self-pay

## 2024-11-18 MED ORDER — ASPIRIN 325 MG PO TBEC
325.0000 mg | DELAYED_RELEASE_TABLET | Freq: Every day | ORAL | 0 refills | Status: AC
  Filled 2024-11-18: qty 14, 14d supply, fill #0

## 2024-11-18 MED ORDER — OXYCODONE HCL 5 MG PO TABS
5.0000 mg | ORAL_TABLET | ORAL | 0 refills | Status: AC | PRN
  Filled 2024-11-18: qty 25, 5d supply, fill #0

## 2024-11-21 ENCOUNTER — Ambulatory Visit (HOSPITAL_BASED_OUTPATIENT_CLINIC_OR_DEPARTMENT_OTHER): Payer: Self-pay | Admitting: Physical Therapy

## 2024-11-21 DIAGNOSIS — M25612 Stiffness of left shoulder, not elsewhere classified: Secondary | ICD-10-CM

## 2024-11-21 DIAGNOSIS — M25512 Pain in left shoulder: Secondary | ICD-10-CM | POA: Diagnosis not present

## 2024-11-21 DIAGNOSIS — M6281 Muscle weakness (generalized): Secondary | ICD-10-CM

## 2024-11-21 NOTE — Therapy (Signed)
 OUTPATIENT PHYSICAL THERAPY UPPER EXTREMITY EVALUATION   Patient Name: Preston Davis MRN: [id number] DOB:06-29-1971, 53 y.o., male Today's Date: 11/21/2024  END OF SESSION:     Past Medical History:  Diagnosis Date   Acid reflux    C. difficile colitis    DVT (deep venous thrombosis) (HCC) 2014   left leg   Factor 5 Leiden mutation, heterozygous    Heart murmur    High blood cholesterol level    Hypertension    Right shoulder pain    Past Surgical History:  Procedure Laterality Date   AORTIC ARCH ANGIOGRAPHY N/A 10/07/2022   Procedure: AORTIC ARCH ANGIOGRAPHY;  Surgeon: Magda Debby SAILOR, MD;  Location: [facility];  Service: Cardiovascular;  Laterality: N/A;   APPENDECTOMY     bone graft  right great toe     for bunion   PERIPHERAL VASCULAR INTERVENTION Left 10/07/2022   Procedure: PERIPHERAL VASCULAR INTERVENTION;  Surgeon: Magda Debby SAILOR, MD;  Location: [facility];  Service: Cardiovascular;  Laterality: Left;  Subclavian   SHOULDER ARTHROSCOPY WITH ROTATOR CUFF REPAIR Left 10/24/2023   Procedure: LEFT SHOULDER ARTHROSCOPY ROTATOR CUFF REPAIR;  Surgeon: Genelle Standing, MD;  Location: [facility];  Service: Orthopedics;  Laterality: Left;   SHOULDER OPEN ROTATOR CUFF REPAIR Right 05/14/2013   Procedure: ROTATOR CUFF REPAIR SHOULDER OPEN;  Surgeon: Lemond Stable, MD;  Location: [facility];  Service: Orthopedics;  Laterality: Right;   VASECTOMY     Patient Active Problem List   Diagnosis Date Noted   Nontraumatic complete tear of left rotator cuff 10/24/2023   HTN (hypertension) 12/16/2017   C. difficile colitis 12/15/2017    PCP: Jerilynn Carnes, MD  REFERRING PROVIDER: Dr Standing Genelle   REFERRING DIAG: LEFT SHOULDER Reverse Total Shoulder   THERAPY DIAG:  No diagnosis found.  Rationale for Evaluation and Treatment: Rehabilitation  ONSET DATE: DOS: 10/24/2023  SUBJECTIVE:                                                                                                                                                                                       SUBJECTIVE STATEMENT: The patient reports has been still somewhat sore but overall feels like it is progressing.  He has worked on his exercises.  He has been compliant with sling use.  : Eval: Patient had a left rotator cuff repair on 11/14/2023.  He continued to have pain following.  On 11/04/2024 he had a reverse right rotator cuff repair.  At this time he has no pain when he is not moving it but up to 10 out of 10 pain with movement.  He  has been compliant with sling use.  PERTINENT HISTORY: Right rotator rotator cuff repair, factor V Leiden mutation Peripheral vascular intervention 11/3 HTN,  PAIN:  PAIN:  Are you having pain? Yes:  Pain level 0/10 10/10 with movement  Pain location: pain in the incision and into his pec  Pain description: sharp  Aggravating factors: any movement of the shoulder  Relieving factors: not moving it  PRECAUTIONS: None  RED FLAGS: None   WEIGHT BEARING RESTRICTIONS: Yes NWB  FALLS:  Has patient fallen in last 6 months? 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  DIAGNOSTIC FINDINGS:  Nothing at this time   PATIENT SURVEYS :  Quick dash 53/55  COGNITION: Overall cognitive status: Within functional limits for tasks assessed     SENSATION: Has had some numbness in his hand  POSTURE: Good   UPPER EXTREMITY ROM:   Passive ROM Right eval Left eval  Shoulder flexion  40  Shoulder extension    Shoulder abduction    Shoulder adduction    Shoulder internal rotation  Can rest on stomach with mild pain   Shoulder external rotation  Neutral  Elbow flexion    Elbow extension    Wrist flexion    Wrist extension    Wrist ulnar deviation    Wrist radial deviation    Wrist pronation    Wrist supination    (Blank rows = not tested)  UPPER EXTREMITY MMT:  MMT Right eval Left eval  Shoulder flexion    Shoulder extension    Shoulder abduction    Shoulder adduction    Shoulder internal rotation    Shoulder external rotation    Middle trapezius    Lower trapezius    Elbow flexion    Elbow extension    Wrist flexion    Wrist extension    Wrist ulnar deviation    Wrist radial deviation    Wrist pronation    Wrist supination    Grip strength (lbs)    (Blank rows = not tested) not tested secondary to recent surgery      PALPATION:  No unexpected TTP    TODAY'S TREATMENT:                                                                                                                                         DATE:  12/18  Manual:  ER and flexion stretching with distraction  Trigger point release to pec and subscap  Review of self pec release   There-ex:   Last visit:  Manual: ER and  flexion stretching with distraction  Trigger point release to pec and subscap  Review of self pec release   The patient required 2 towels underneath his arm for positioning for manual therapy  There ex: Reviewed self external rotation with wand.  Patient advised not to stretch range of motion but to just move arm into external rotation.  He has adequate range of motion at this time.  Patient given exercises more for stiffness purposes.  2 x 8  Seated Scap retraction 2 x 8  Reviewed pendulum exercises for home with technique.  Patient advised to use hips.  Self elbow flexion 3 x 10  Self flexion in the low range of motion with opposite hand 3 x 5  Eval: Manual:  ER and flexion stretching with distraction  Trigger point release to pec and subscap  Review of self pec release    There-ex:  Scap retraction to neutral 3x10  Table slide 2x10  Self flexion 2x5  Self ER with education not to stretch, just to move    Evals: Exercises - Circular Shoulder Pendulum with Table Support  - 1 x daily - 7 x weekly - 3 sets - 10 reps - Seated Elbow Flexion AAROM  - 1 x daily - 7 x weekly - 3 sets - 10 reps - Seated Scapular Retraction  - 1 x daily - 7 x weekly - 3 sets - 10 reps  Manual: PROM into flexion and ER with distraction   Bandage change- no s/s of infection; bandages removed, sutures in place, clean dry intact; tegaderm and fresh gauze replaced   PATIENT EDUCATION: Education details: HEP, symptom management , improtance of the sling  Person educated: Patient Education method: Explanation, Demonstration, Tactile cues, Verbal cues, and Handouts Education comprehension: verbalized understanding, returned demonstration, verbal cues required, tactile cues required, and needs further education  HOME EXERCISE PROGRAM: Access Code: [email or web address] URL: [email or web address] Date: 10/31/2023 Prepared by: Alm Don  ASSESSMENT:  CLINICAL IMPRESSION: The patient has a difficulty with initial positioning of the shoulder.  He was unable to lie with his shoulder in neutral position.  He had to have a slightly pushed forward to lie comfortably.  We had a position with 2 towels.  Once positioned properly he had no significant pain.  His external rotation was tight in the beginning but loosened up very well as we worked on range of motion.  We reviewed home exercises to work on.  He is advised not to force anything but just use normal movement.  He goes to see the MD after treatment.  Eval: Patient is a 53 year old male status post left reverse total shoulder replacement on 11/04/2024.  At this time he presents with expected limitations in range of motion, strength, upper extremity function on his dominant side.  His pain is well-controlled.  He would benefit from  skilled therapy to improve use of dominant shoulder and be able to return to work.  OBJECTIVE IMPAIRMENTS: decreased ROM, decreased strength, impaired UE functional use, and pain.   ACTIVITY LIMITATIONS: carrying, lifting, sleeping, bathing, toileting, dressing, self feeding, reach over head, and hygiene/grooming  PARTICIPATION LIMITATIONS: meal prep, cleaning, laundry, driving, shopping, community activity, occupation, and yard work  PERSONAL FACTORS: 1 comorbidity: previous right RTC tear  are also affecting patient's functional outcome.   REHAB POTENTIAL: Good  CLINICAL DECISION MAKING: Stable/uncomplicated  EVALUATION COMPLEXITY: Low  GOALS: Goals reviewed with patient? Yes  SHORT TERM GOALS: Target date: 12/12/2023    Patient will increase passive flexion  to 110 degrees on the left Baseline: Goal status: INITIAL  2.  Patient will increase passive external rotation 20 degrees on the left Baseline:  Goal status: INITIAL  3.  Patient will wean out of sling per MD Baseline:  Goal status: INITIAL  4.  Patient will be independent with a base exercise program Baseline:  Goal status: INITIAL  LONG TERM GOALS: Target date: 01/23/2024    Patient will reach behind his head without increased pain in order to perform daily tasks Baseline:  Goal status: INITIAL  2.  Patient will reach overhead with 2 pound weight in order to put dishes away Baseline:  Goal status: INITIAL  3.  Patient will return to work tasks when cleared by MD Baseline:  Goal status: INITIAL  PLAN: PT FREQUENCY: 1-2x/week  PT DURATION: 8 weeks  PLANNED INTERVENTIONS: 97110-Therapeutic exercises, 97530- Therapeutic activity, [id number]- Neuromuscular re-education, 97535- Self Care, 02859- Manual therapy, [id number]- Aquatic Therapy, 97014- Electrical stimulation (unattended), 97035- Ultrasound, Patient/Family education, Taping, Dry Needling, DME instructions, Cryotherapy, and Moist heat  PLAN FOR NEXT SESSION:   Begin per reverse total shoulder protocol No subscap repair performed    Alm JINNY Don, PT 11/21/2024, 9:59 AM

## 2024-11-26 ENCOUNTER — Ambulatory Visit (HOSPITAL_BASED_OUTPATIENT_CLINIC_OR_DEPARTMENT_OTHER): Payer: Self-pay | Admitting: Physical Therapy

## 2024-11-26 DIAGNOSIS — M6281 Muscle weakness (generalized): Secondary | ICD-10-CM

## 2024-11-26 DIAGNOSIS — M25512 Pain in left shoulder: Secondary | ICD-10-CM | POA: Diagnosis not present

## 2024-11-26 DIAGNOSIS — M25612 Stiffness of left shoulder, not elsewhere classified: Secondary | ICD-10-CM

## 2024-11-26 NOTE — Therapy (Signed)
 " OUTPATIENT PHYSICAL THERAPY UPPER EXTREMITY EVALUATION   Patient Name: Preston Davis MRN: [id number] DOB:September 17, 1971, 53 y.o., male Today's Date: 11/26/2024  END OF SESSION:      Past Medical History:  Diagnosis Date   Acid reflux    C. difficile colitis    DVT (deep venous thrombosis) (HCC) 2014   left leg   Factor 5 Leiden mutation, heterozygous    Heart murmur    High blood cholesterol level    Hypertension    Right shoulder pain    Past Surgical History:  Procedure Laterality Date   AORTIC ARCH ANGIOGRAPHY N/A 10/07/2022   Procedure: AORTIC ARCH ANGIOGRAPHY;  Surgeon: Magda Debby SAILOR, MD;  Location: [facility];  Service: Cardiovascular;  Laterality: N/A;   APPENDECTOMY     bone graft  right great toe     for bunion   PERIPHERAL VASCULAR INTERVENTION Left 10/07/2022   Procedure: PERIPHERAL VASCULAR INTERVENTION;  Surgeon: Magda Debby SAILOR, MD;  Location: [facility];  Service: Cardiovascular;  Laterality: Left;  Subclavian   SHOULDER ARTHROSCOPY WITH ROTATOR CUFF REPAIR Left 10/24/2023   Procedure: LEFT SHOULDER ARTHROSCOPY ROTATOR CUFF REPAIR;  Surgeon: Genelle Standing, MD;  Location: [facility];  Service: Orthopedics;  Laterality: Left;   SHOULDER OPEN ROTATOR CUFF REPAIR Right 05/14/2013   Procedure: ROTATOR CUFF REPAIR SHOULDER OPEN;  Surgeon: Lemond Stable, MD;  Location: [facility];  Service: Orthopedics;  Laterality: Right;   VASECTOMY     Patient Active Problem List   Diagnosis Date Noted   Nontraumatic complete tear of left rotator cuff 10/24/2023   HTN (hypertension) 12/16/2017   C. difficile colitis 12/15/2017    PCP: Jerilynn Carnes, MD  REFERRING PROVIDER: Dr Standing Genelle   REFERRING DIAG: LEFT SHOULDER Reverse Total Shoulder   THERAPY DIAG:  No diagnosis found.  Rationale for Evaluation and Treatment: Rehabilitation  ONSET DATE: DOS: 10/24/2023  SUBJECTIVE:                                                                                                                                                                                       SUBJECTIVE STATEMENT: The patients shoulder has done well. He is using it more for functional tasks. He is having no pain today.   : Eval: Patient had a left rotator cuff repair on 11/14/2023.  He continued to have pain following.  On 11/04/2024 he had a reverse right rotator cuff repair.  At this time he has no pain when he is not moving it but up to 10 out of 10 pain with movement.  He has been compliant with sling use.  PERTINENT HISTORY: Right rotator rotator cuff repair, factor V Leiden mutation Peripheral vascular intervention 11/3 HTN,  PAIN:  PAIN:  Are you having pain? Yes:  Pain level 0/10 1Pain location: pain in the incision and into his pec  Pain description: sharp  Aggravating factors: any movement of the shoulder  Relieving factors: not moving it  PRECAUTIONS: None  RED FLAGS: None   WEIGHT BEARING RESTRICTIONS: Yes NWB  FALLS:  Has patient fallen in last 6 months? 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  DIAGNOSTIC FINDINGS:  Nothing at this time   PATIENT SURVEYS :  Quick dash 53/55  COGNITION: Overall cognitive status: Within functional limits for tasks assessed     SENSATION: Has had some numbness in his hand  POSTURE: Good   UPPER EXTREMITY ROM:   Passive ROM Right eval Left eval  Shoulder flexion  40  Shoulder extension    Shoulder abduction    Shoulder adduction    Shoulder internal rotation  Can rest on stomach with mild pain  Shoulder external rotation  Neutral  Elbow flexion    Elbow extension     Wrist flexion    Wrist extension    Wrist ulnar deviation    Wrist radial deviation    Wrist pronation    Wrist supination    (Blank rows = not tested)  UPPER EXTREMITY MMT:  MMT Right eval Left eval  Shoulder flexion    Shoulder extension    Shoulder abduction    Shoulder adduction    Shoulder internal rotation    Shoulder external rotation    Middle trapezius    Lower trapezius    Elbow flexion    Elbow extension    Wrist flexion    Wrist extension    Wrist ulnar deviation    Wrist radial deviation    Wrist pronation    Wrist supination    Grip strength (lbs)    (Blank rows = not tested) not tested secondary to recent surgery      PALPATION:  No unexpected TTP    TODAY'S TREATMENT:                                                                                                                                         DATE:  12/23 Manual:  ER and flexion stretching with distraction  Trigger point release to pec and subscap  Review of self pec release   There-ex:  Wand press 1lb 3x10 minor pain noted  Wand flexion 3x10 2lb fatigue noted  Active ER in pain  free range 3x10 1 lb    Neuro-re-ed:  Row yellow 3x10 to neutral  Supine ABC reported fatigue after   12/18  Manual:  ER and flexion stretching with distraction  Trigger point release to pec and subscap  Review of self pec release   There-ex:  Wand press 1lb 3x10 minor pain noted  Wand flexion 3x10 no pain noted  Active ER in pain free range 3x10  Wand ER for stretch but pain free 2x8 5 sec hold   Neuro-re-ed:  Row yellow 3x10 to neutral  Cuing for posture   Last visit:  Manual: ER and flexion stretching with distraction  Trigger point release to pec and subscap  Review of self pec release   The patient required 2 towels underneath his arm for positioning for manual therapy  There ex: Reviewed self external rotation with wand.  Patient advised not to stretch range of motion but to  just move arm into external rotation.  He has adequate range of motion at this time.  Patient given exercises more for stiffness purposes.  2 x 8  Seated Scap retraction 2 x 8  Reviewed pendulum exercises for home with technique.  Patient advised to use hips.  Self elbow flexion 3 x 10  Self flexion in the low range of motion with opposite hand 3 x 5  Eval: Manual:  ER and flexion stretching with distraction  Trigger point release to pec and subscap  Review of self pec release   There-ex:  Scap retraction to neutral 3x10  Table slide 2x10  Self flexion 2x5  Self ER with education not to stretch, just to move    Evals: Exercises - Circular Shoulder Pendulum with Table Support  - 1 x daily - 7 x weekly - 3 sets - 10 reps - Seated Elbow Flexion AAROM  - 1 x daily - 7 x weekly - 3 sets - 10 reps - Seated Scapular Retraction  - 1 x daily - 7 x weekly - 3 sets - 10 reps  Manual: PROM into flexion and ER with distraction   Bandage change- no s/s of infection; bandages removed, sutures in place, clean dry intact; tegaderm and fresh gauze replaced   PATIENT EDUCATION: Education details: HEP, symptom management , improtance of the sling  Person educated: Patient Education method: Explanation, Demonstration, Tactile cues, Verbal cues, and Handouts Education comprehension: verbalized understanding, returned demonstration, verbal cues required, tactile cues required, and needs further education  HOME EXERCISE PROGRAM: Access Code: [email or web address] URL: [email or web address] Date: 10/31/2023 Prepared by: Alm Don  ASSESSMENT:  CLINICAL IMPRESSION: The patient is making great progress. He has had no real pain today.SABRA His ROM is progressing as we would hope. His flexion improved to 130 degrees without pain. We ramped up his exercises somewhat.    Eval: Patient is a 53 year old male status post left reverse total shoulder replacement on 11/04/2024.  At this time he presents  with expected limitations in range of motion, strength, upper extremity function on his dominant side.  His pain is well-controlled.  He would benefit from skilled therapy to improve use of dominant shoulder and be able to return to work.  OBJECTIVE IMPAIRMENTS: decreased ROM, decreased strength, impaired UE functional use, and pain.   ACTIVITY LIMITATIONS: carrying, lifting, sleeping, bathing, toileting, dressing, self feeding, reach over head, and hygiene/grooming  PARTICIPATION LIMITATIONS: meal prep, cleaning, laundry, driving, shopping, community activity, occupation, and yard work  PERSONAL FACTORS: 1 comorbidity: previous right RTC tear  are also  affecting patient's functional outcome.   REHAB POTENTIAL: Good  CLINICAL DECISION MAKING: Stable/uncomplicated  EVALUATION COMPLEXITY: Low  GOALS: Goals reviewed with patient? Yes  SHORT TERM GOALS: Target date: 12/12/2023    Patient will increase passive flexion to 110 degrees on the left Baseline: Goal status: INITIAL  2.  Patient will increase passive external rotation 20 degrees on the left Baseline:  Goal status: INITIAL  3.  Patient will wean out of sling per MD Baseline:  Goal status: INITIAL  4.  Patient will be independent with a base exercise program Baseline:  Goal status: INITIAL  LONG TERM GOALS: Target date: 01/23/2024    Patient will reach behind his head without increased pain in order to perform daily tasks Baseline:  Goal status: INITIAL  2.  Patient will reach overhead with 2 pound weight in order to put dishes away Baseline:  Goal status: INITIAL  3.  Patient will return to work tasks when cleared by MD Baseline:  Goal status: INITIAL  PLAN: PT FREQUENCY: 1-2x/week  PT DURATION: 8 weeks  PLANNED INTERVENTIONS: 97110-Therapeutic exercises, 97530- Therapeutic activity, 97112- Neuromuscular re-education, 97535- Self Care, 02859- Manual therapy, [phone]- Aquatic Therapy, 97014- Electrical  stimulation (unattended), 97035- Ultrasound, Patient/Family education, Taping, Dry Needling, DME instructions, Cryotherapy, and Moist heat  PLAN FOR NEXT SESSION:  Begin per reverse total shoulder protocol No subscap repair performed    Alm JINNY Don, PT 11/26/2024, 9:51 AM  "

## 2024-12-02 ENCOUNTER — Other Ambulatory Visit: Payer: Self-pay | Admitting: *Deleted

## 2024-12-02 MED ORDER — ATORVASTATIN CALCIUM 40 MG PO TABS: 40.0000 mg | ORAL_TABLET | Freq: Every day | ORAL | 0 refills | Status: AC

## 2024-12-03 ENCOUNTER — Encounter (HOSPITAL_BASED_OUTPATIENT_CLINIC_OR_DEPARTMENT_OTHER): Payer: Self-pay | Admitting: Physical Therapy

## 2024-12-03 ENCOUNTER — Ambulatory Visit (HOSPITAL_BASED_OUTPATIENT_CLINIC_OR_DEPARTMENT_OTHER): Payer: Self-pay | Admitting: Physical Therapy

## 2024-12-03 DIAGNOSIS — M25512 Pain in left shoulder: Secondary | ICD-10-CM | POA: Diagnosis not present

## 2024-12-03 DIAGNOSIS — M25612 Stiffness of left shoulder, not elsewhere classified: Secondary | ICD-10-CM

## 2024-12-03 DIAGNOSIS — M6281 Muscle weakness (generalized): Secondary | ICD-10-CM

## 2024-12-03 NOTE — Therapy (Signed)
 " OUTPATIENT PHYSICAL THERAPY UPPER EXTREMITY EVALUATION   Patient Name: Preston Davis MRN: [id number] DOB:10-26-1971, 53 y.o., male Today's Date: 12/03/2024  END OF SESSION:  PT End of Session - 12/03/24 1010     Visit Number 5    Number of Visits 24    Date for Recertification  01/02/25    Authorization Type BC[employer]    PT Start Time 0935    PT Stop Time 1013    PT Time Calculation (min) 38 min    Activity Tolerance Patient tolerated treatment well             Past Medical History:  Diagnosis Date   Acid reflux    C. difficile colitis    DVT (deep venous thrombosis) (HCC) 2014   left leg   Factor 5 Leiden mutation, heterozygous    Heart murmur    High blood cholesterol level    Hypertension    Right shoulder pain    Past Surgical History:  Procedure Laterality Date   AORTIC ARCH ANGIOGRAPHY N/A 10/07/2022   Procedure: AORTIC ARCH ANGIOGRAPHY;  Surgeon: Magda Debby SAILOR, MD;  Location: [facility];  Service: Cardiovascular;  Laterality: N/A;   APPENDECTOMY     bone graft  right great toe     for bunion   PERIPHERAL VASCULAR INTERVENTION Left 10/07/2022   Procedure: PERIPHERAL VASCULAR INTERVENTION;  Surgeon: Magda Debby SAILOR, MD;  Location: [facility];  Service: Cardiovascular;  Laterality: Left;  Subclavian   SHOULDER ARTHROSCOPY WITH ROTATOR CUFF REPAIR Left 10/24/2023   Procedure: LEFT SHOULDER ARTHROSCOPY ROTATOR CUFF REPAIR;  Surgeon: Genelle Standing, MD;  Location: [facility];  Service: Orthopedics;  Laterality: Left;   SHOULDER OPEN ROTATOR CUFF REPAIR Right 05/14/2013   Procedure: ROTATOR CUFF REPAIR SHOULDER OPEN;  Surgeon: Lemond Stable, MD;  Location: [facility];  Service: Orthopedics;  Laterality: Right;   VASECTOMY     Patient Active Problem List   Diagnosis Date Noted   Nontraumatic complete tear of left rotator cuff 10/24/2023   HTN (hypertension) 12/16/2017   C. difficile colitis 12/15/2017    PCP: Jerilynn Carnes,  MD  REFERRING PROVIDER: Dr Standing Genelle   REFERRING DIAG: LEFT SHOULDER Reverse Total Shoulder   THERAPY DIAG:  Acute pain of left shoulder  Muscle weakness (generalized)  Stiffness of left shoulder, not elsewhere classified  Rationale for Evaluation and Treatment: Rehabilitation  ONSET DATE: DOS: 10/24/2023  SUBJECTIVE:  SUBJECTIVE STATEMENT: The patients shoulder has done well. He is using it more for functional tasks. He is having no pain today.   : Eval: Patient had a left rotator cuff repair on 11/14/2023.  He continued to have pain following.  On 11/04/2024 he had a reverse right rotator cuff repair.  At this time he has no pain when he is not moving it but up to 10 out of 10 pain with movement.  He has been compliant with sling use.  PERTINENT HISTORY: Right rotator rotator cuff repair, factor V Leiden mutation Peripheral vascular intervention 11/3 HTN,  PAIN:  PAIN:  Are you having pain? Yes:  Pain level 0/10 1Pain location: pain in the incision and into his pec  Pain description: sharp  Aggravating factors: any movement of the shoulder  Relieving factors: not moving it  PRECAUTIONS: None  RED FLAGS: None   WEIGHT BEARING RESTRICTIONS: Yes NWB  FALLS:  Has patient fallen in last 6 months? 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  DIAGNOSTIC FINDINGS:  Nothing at this time   PATIENT SURVEYS :  Quick dash 53/55  COGNITION: Overall cognitive status: Within  functional limits for tasks assessed     SENSATION: Has had some numbness in his hand  POSTURE: Good   UPPER EXTREMITY ROM:   Passive ROM Right eval Left eval  Shoulder flexion  40  Shoulder extension    Shoulder abduction    Shoulder adduction    Shoulder internal rotation  Can rest on stomach with mild pain  Shoulder external rotation  Neutral  Elbow flexion    Elbow extension    Wrist flexion    Wrist extension    Wrist ulnar deviation    Wrist radial deviation    Wrist pronation    Wrist supination    (Blank rows = not tested)  UPPER EXTREMITY MMT:  MMT Right eval Left eval  Shoulder flexion    Shoulder extension    Shoulder abduction    Shoulder adduction    Shoulder internal rotation    Shoulder external rotation    Middle trapezius    Lower trapezius    Elbow flexion    Elbow extension    Wrist flexion    Wrist extension    Wrist ulnar deviation    Wrist radial deviation    Wrist pronation    Wrist supination    Grip strength (lbs)    (Blank rows = not tested) not tested secondary to recent surgery      PALPATION:  No unexpected TTP    TODAY'S TREATMENT:  DATE:  12/30 Manual:  ER and flexion stretching with distraction  Trigger point release to pec and subscap  Review of self pec release   There-ex:  Wand press 3lb 3x10 had pain in the biceps towards the end    Wand flexion 2x10 continued pain in the biceps so exercise halted   Neuro-re-ed:  ABC 2x no weight minor pain noted  Finger ladder 2x3 minor pain noted  Row Green 3x10 no pain  Shoulder er extension to neutral 3x10 no pain  Muti plane ball movement in standing vs table  2 min x2 in all directions  12/23 Manual:  ER and flexion stretching with distraction  Trigger point release to pec and subscap  Review of self pec release   There-ex:   Wand press 2lb 3x10 minor pain noted  Wand flexion 3x10 2lb fatigue noted  Active ER in pain free range 3x10 1 lb    Neuro-re-ed:  Row yellow 3x10 to neutral  Supine ABC reported fatigue after   12/18  Manual:  ER and flexion stretching with distraction  Trigger point release to pec and subscap  Review of self pec release   There-ex:  Wand press 1lb 3x10 minor pain noted  Wand flexion 3x10 no pain noted  Active ER in pain free range 3x10  Wand ER for stretch but pain free 2x8 5 sec hold   Neuro-re-ed:  Row yellow 3x10 to neutral  Cuing for posture   Last visit:  Manual: ER and flexion stretching with distraction  Trigger point release to pec and subscap  Review of self pec release   The patient required 2 towels underneath his arm for positioning for manual therapy  There ex: Reviewed self external rotation with wand.  Patient advised not to stretch range of motion but to just move arm into external rotation.  He has adequate range of motion at this time.  Patient given exercises more for stiffness purposes.  2 x 8  Seated Scap retraction 2 x 8  Reviewed pendulum exercises for home with technique.  Patient advised to use hips.  Self elbow flexion 3 x 10  Self flexion in the low range of motion with opposite hand 3 x 5  Eval: Manual:  ER and flexion stretching with distraction  Trigger point release to pec and subscap  Review of self pec release   There-ex:  Scap retraction to neutral 3x10  Table slide 2x10  Self flexion 2x5  Self ER with education not to stretch, just to move    Evals: Exercises - Circular Shoulder Pendulum with Table Support  - 1 x daily - 7 x weekly - 3 sets - 10 reps - Seated Elbow Flexion AAROM  - 1 x daily - 7 x weekly - 3 sets - 10 reps - Seated Scapular Retraction  - 1 x daily - 7 x weekly - 3 sets - 10 reps  Manual: PROM into flexion and ER with distraction   Bandage change- no s/s of infection; bandages removed, sutures in  place, clean dry intact; tegaderm and fresh gauze replaced   PATIENT EDUCATION: Education details: HEP, symptom management , improtance of the sling  Person educated: Patient Education method: Explanation, Demonstration, Tactile cues, Verbal cues, and Handouts Education comprehension: verbalized understanding, returned demonstration, verbal cues required, tactile cues required, and needs further education  HOME EXERCISE PROGRAM: Access Code: [email or web address] URL: [email or web address] Date: 10/31/2023 Prepared by: Alm Don  ASSESSMENT:  CLINICAL IMPRESSION: Therapy attempted to advance the  patients press but the patient had more pain in his biceps. He had pain with overhead flexion as well. He tolerated ball rolling and scapular exercises well. He was advised to listen to his shoulder over the next few days and to ice if it is sore.    Eval: Patient is a 54 year old male status post left reverse total shoulder replacement on 11/04/2024.  At this time he presents with expected limitations in range of motion, strength, upper extremity function on his dominant side.  His pain is well-controlled.  He would benefit from skilled therapy to improve use of dominant shoulder and be able to return to work.  OBJECTIVE IMPAIRMENTS: decreased ROM, decreased strength, impaired UE functional use, and pain.   ACTIVITY LIMITATIONS: carrying, lifting, sleeping, bathing, toileting, dressing, self feeding, reach over head, and hygiene/grooming  PARTICIPATION LIMITATIONS: meal prep, cleaning, laundry, driving, shopping, community activity, occupation, and yard work  PERSONAL FACTORS: 1 comorbidity: previous right RTC tear  are also affecting patient's functional outcome.   REHAB POTENTIAL: Good  CLINICAL DECISION MAKING: Stable/uncomplicated  EVALUATION COMPLEXITY: Low  GOALS: Goals reviewed with patient? Yes  SHORT TERM GOALS: Target date: 12/12/2023    Patient will increase passive  flexion to 110 degrees on the left Baseline: Goal status: INITIAL  2.  Patient will increase passive external rotation 20 degrees on the left Baseline:  Goal status: INITIAL  3.  Patient will wean out of sling per MD Baseline:  Goal status: INITIAL  4.  Patient will be independent with a base exercise program Baseline:  Goal status: INITIAL  LONG TERM GOALS: Target date: 01/23/2024    Patient will reach behind his head without increased pain in order to perform daily tasks Baseline:  Goal status: INITIAL  2.  Patient will reach overhead with 2 pound weight in order to put dishes away Baseline:  Goal status: INITIAL  3.  Patient will return to work tasks when cleared by MD Baseline:  Goal status: INITIAL  PLAN: PT FREQUENCY: 1-2x/week  PT DURATION: 8 weeks  PLANNED INTERVENTIONS: 97110-Therapeutic exercises, 97530- Therapeutic activity, [id number]- Neuromuscular re-education, 97535- Self Care, 02859- Manual therapy, [phone]- Aquatic Therapy, 97014- Electrical stimulation (unattended), 97035- Ultrasound, Patient/Family education, Taping, Dry Needling, DME instructions, Cryotherapy, and Moist heat  PLAN FOR NEXT SESSION:  Begin per reverse total shoulder protocol No subscap repair performed    Alm JINNY Don, PT 12/03/2024, 10:21 AM  "

## 2024-12-06 ENCOUNTER — Ambulatory Visit (HOSPITAL_BASED_OUTPATIENT_CLINIC_OR_DEPARTMENT_OTHER): Payer: Self-pay | Attending: Orthopaedic Surgery | Admitting: Physical Therapy

## 2024-12-06 ENCOUNTER — Encounter (HOSPITAL_BASED_OUTPATIENT_CLINIC_OR_DEPARTMENT_OTHER): Payer: Self-pay | Admitting: Orthopaedic Surgery

## 2024-12-06 DIAGNOSIS — M25612 Stiffness of left shoulder, not elsewhere classified: Secondary | ICD-10-CM | POA: Diagnosis present

## 2024-12-06 DIAGNOSIS — M25512 Pain in left shoulder: Secondary | ICD-10-CM | POA: Diagnosis present

## 2024-12-06 DIAGNOSIS — M6281 Muscle weakness (generalized): Secondary | ICD-10-CM | POA: Insufficient documentation

## 2024-12-06 NOTE — Therapy (Signed)
 " OUTPATIENT PHYSICAL THERAPY UPPER EXTREMITY EVALUATION   Patient Name: Preston Davis MRN: [id number] DOB:May 28, 1971, 54 y.o., male Today's Date: 12/06/2024  END OF SESSION:  PT End of Session - 12/06/24 1054     Visit Number 6    Number of Visits 24    Date for Recertification  01/02/25    Authorization Type BCBS    PT Start Time [phone]    PT Stop Time 1015    PT Time Calculation (min) 42 min    Activity Tolerance Patient tolerated treatment well    Behavior During Therapy WFL for tasks assessed/performed              Past Medical History:  Diagnosis Date   Acid reflux    C. difficile colitis    DVT (deep venous thrombosis) (HCC) 2014   left leg   Factor 5 Leiden mutation, heterozygous    Heart murmur    High blood cholesterol level    Hypertension    Right shoulder pain    Past Surgical History:  Procedure Laterality Date   AORTIC ARCH ANGIOGRAPHY N/A 10/07/2022   Procedure: AORTIC ARCH ANGIOGRAPHY;  Surgeon: Magda Debby SAILOR, MD;  Location: [facility];  Service: Cardiovascular;  Laterality: N/A;   APPENDECTOMY     bone graft  right great toe     for bunion   PERIPHERAL VASCULAR INTERVENTION Left 10/07/2022   Procedure: PERIPHERAL VASCULAR INTERVENTION;  Surgeon: Magda Debby SAILOR, MD;  Location: [facility];  Service: Cardiovascular;  Laterality: Left;  Subclavian   SHOULDER ARTHROSCOPY WITH ROTATOR CUFF REPAIR Left 10/24/2023   Procedure: LEFT SHOULDER ARTHROSCOPY ROTATOR CUFF REPAIR;  Surgeon: Genelle Standing, MD;  Location: [facility];  Service: Orthopedics;  Laterality: Left;   SHOULDER OPEN ROTATOR CUFF REPAIR Right 05/14/2013   Procedure: ROTATOR CUFF REPAIR SHOULDER OPEN;  Surgeon: Lemond Stable, MD;  Location: [facility];  Service: Orthopedics;  Laterality: Right;   VASECTOMY     Patient Active Problem List   Diagnosis Date Noted   Nontraumatic complete tear of left rotator cuff 10/24/2023   HTN (hypertension) 12/16/2017   C.  difficile colitis 12/15/2017    PCP: Jerilynn Carnes, MD  REFERRING PROVIDER: Dr Standing Genelle   REFERRING DIAG: LEFT SHOULDER Reverse Total Shoulder   THERAPY DIAG:  No diagnosis found.  Rationale for Evaluation and Treatment: Rehabilitation  ONSET DATE: DOS: 10/24/2023  SUBJECTIVE:  SUBJECTIVE STATEMENT: The patient had mild soreness following the last visit. Overall he is doing well.    : Eval: Patient had a left rotator cuff repair on 11/14/2023.  He continued to have pain following.  On 11/04/2024 he had a reverse right rotator cuff repair.  At this time he has no pain when he is not moving it but up to 10 out of 10 pain with movement.  He has been compliant with sling use.  PERTINENT HISTORY: Right rotator rotator cuff repair, factor V Leiden mutation Peripheral vascular intervention 11/3 HTN,  PAIN:  PAIN:  Are you having pain? Yes:  Pain level 0/10 1Pain location: pain in the incision and into his pec  Pain description: sharp  Aggravating factors: any movement of the shoulder  Relieving factors: not moving it  PRECAUTIONS: None  RED FLAGS: None   WEIGHT BEARING RESTRICTIONS: Yes NWB  FALLS:  Has patient fallen in last 6 months? 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  DIAGNOSTIC FINDINGS:  Nothing at this time   PATIENT SURVEYS :  Quick dash 53/55  COGNITION: Overall cognitive status: Within functional limits for tasks assessed     SENSATION: Has had some  numbness in his hand  POSTURE: Good   UPPER EXTREMITY ROM:   Passive ROM Right eval Left eval  Shoulder flexion  40  Shoulder extension    Shoulder abduction    Shoulder adduction    Shoulder internal rotation  Can rest on stomach with mild pain  Shoulder external rotation  Neutral  Elbow flexion    Elbow extension    Wrist flexion    Wrist extension    Wrist ulnar deviation    Wrist radial deviation    Wrist pronation    Wrist supination    (Blank rows = not tested)  UPPER EXTREMITY MMT:  MMT Right eval Left eval  Shoulder flexion    Shoulder extension    Shoulder abduction    Shoulder adduction    Shoulder internal rotation    Shoulder external rotation    Middle trapezius    Lower trapezius    Elbow flexion    Elbow extension    Wrist flexion    Wrist extension    Wrist ulnar deviation    Wrist radial deviation    Wrist pronation    Wrist supination    Grip strength (lbs)    (Blank rows = not tested) not tested secondary to recent surgery      PALPATION:  No unexpected TTP    TODAY'S TREATMENT:  DATE:  1/2 Manual:  ER and flexion stretching with distraction  Trigger point release to pec and subscap  Review of self pec release   There-ex:  Wand flexion 3x10 2 lbs  Butt touches 3x10   There-act:  Flexion in mirror 3x10  Scaption 3x10   Neuro-re-ed   Supine ABC 1lb 2x     12/30 Manual:  ER and flexion stretching with distraction  Trigger point release to pec and subscap  Review of self pec release   There-ex:  Wand press 3lb 3x10 had pain in the biceps towards the end    Wand flexion 2x10 continued pain in the biceps so exercise halted   Neuro-re-ed:  ABC 2x no weight minor pain noted  Finger ladder 2x3 minor pain noted  Row Green 3x10 no pain  Shoulder er extension to neutral 3x10 no pain   Muti plane ball movement in standing vs table  2 min x2 in all directions  12/23 Manual:  ER and flexion stretching with distraction  Trigger point release to pec and subscap  Review of self pec release   There-ex:  Wand press 2lb 3x10 minor pain noted  Wand flexion 3x10 2lb fatigue noted  Active ER in pain free range 3x10 1 lb    Neuro-re-ed:  Row yellow 3x10 to neutral  Supine ABC reported fatigue after   12/18  Manual:  ER and flexion stretching with distraction  Trigger point release to pec and subscap  Review of self pec release   There-ex:  Wand press 1lb 3x10 minor pain noted  Wand flexion 3x10 no pain noted  Active ER in pain free range 3x10  Wand ER for stretch but pain free 2x8 5 sec hold   Neuro-re-ed:  Row yellow 3x10 to neutral  Cuing for posture   Last visit:  Manual: ER and flexion stretching with distraction  Trigger point release to pec and subscap  Review of self pec release   The patient required 2 towels underneath his arm for positioning for manual therapy  There ex: Reviewed self external rotation with wand.  Patient advised not to stretch range of motion but to just move arm into external rotation.  He has adequate range of motion at this time.  Patient given exercises more for stiffness purposes.  2 x 8  Seated Scap retraction 2 x 8  Reviewed pendulum exercises for home with technique.  Patient advised to use hips.  Self elbow flexion 3 x 10  Self flexion in the low range of motion with opposite hand 3 x 5  Eval: Manual:  ER and flexion stretching with distraction  Trigger point release to pec and subscap  Review of self pec release   There-ex:  Scap retraction to neutral 3x10  Table slide 2x10  Self flexion 2x5  Self ER with education not to stretch, just to move    Evals: Exercises - Circular Shoulder Pendulum with Table Support  - 1 x daily - 7 x weekly - 3 sets - 10 reps - Seated Elbow Flexion AAROM  - 1 x daily - 7 x  weekly - 3 sets - 10 reps - Seated Scapular Retraction  - 1 x daily - 7 x weekly - 3 sets - 10 reps  Manual: PROM into flexion and ER with distraction   Bandage change- no s/s of infection; bandages removed, sutures in place, clean dry intact; tegaderm and fresh gauze replaced   PATIENT EDUCATION: Education details: HEP, symptom management ,  improtance of the sling  Person educated: Patient Education method: Explanation, Demonstration, Tactile cues, Verbal cues, and Handouts Education comprehension: verbalized understanding, returned demonstration, verbal cues required, tactile cues required, and needs further education  HOME EXERCISE PROGRAM: Access Code: [email or web address] URL: [email or web address] Date: 10/31/2023 Prepared by: Alm Don  ASSESSMENT:  CLINICAL IMPRESSION: Therapy added in standing scaption and flexion. He tolerated well.  We also added in butt touches to being working on IR. He goes back to work next week. He will likely continue 1x a week as long as he can with work. We updated his HEP. We will continue to progress as tolerated.   Eval: Patient is a 54 year old male status post left reverse total shoulder replacement on 11/04/2024.  At this time he presents with expected limitations in range of motion, strength, upper extremity function on his dominant side.  His pain is well-controlled.  He would benefit from skilled therapy to improve use of dominant shoulder and be able to return to work.  OBJECTIVE IMPAIRMENTS: decreased ROM, decreased strength, impaired UE functional use, and pain.   ACTIVITY LIMITATIONS: carrying, lifting, sleeping, bathing, toileting, dressing, self feeding, reach over head, and hygiene/grooming  PARTICIPATION LIMITATIONS: meal prep, cleaning, laundry, driving, shopping, community activity, occupation, and yard work  PERSONAL FACTORS: 1 comorbidity: previous right RTC tear  are also affecting patient's functional outcome.   REHAB  POTENTIAL: Good  CLINICAL DECISION MAKING: Stable/uncomplicated  EVALUATION COMPLEXITY: Low  GOALS: Goals reviewed with patient? Yes  SHORT TERM GOALS: Target date: 12/12/2023    Patient will increase passive flexion to 110 degrees on the left Baseline: Goal status: INITIAL  2.  Patient will increase passive external rotation 20 degrees on the left Baseline:  Goal status: INITIAL  3.  Patient will wean out of sling per MD Baseline:  Goal status: INITIAL  4.  Patient will be independent with a base exercise program Baseline:  Goal status: INITIAL  LONG TERM GOALS: Target date: 01/23/2024    Patient will reach behind his head without increased pain in order to perform daily tasks Baseline:  Goal status: INITIAL  2.  Patient will reach overhead with 2 pound weight in order to put dishes away Baseline:  Goal status: INITIAL  3.  Patient will return to work tasks when cleared by MD Baseline:  Goal status: INITIAL  PLAN: PT FREQUENCY: 1-2x/week  PT DURATION: 8 weeks  PLANNED INTERVENTIONS: 97110-Therapeutic exercises, 97530- Therapeutic activity, 97112- Neuromuscular re-education, 97535- Self Care, [id number]- Manual therapy, [phone]- Aquatic Therapy, 97014- Electrical stimulation (unattended), 97035- Ultrasound, Patient/Family education, Taping, Dry Needling, DME instructions, Cryotherapy, and Moist heat  PLAN FOR NEXT SESSION:  Begin per reverse total shoulder protocol No subscap repair performed    Alm JINNY Don, PT 12/06/2024, 10:55 AM  "

## 2024-12-10 ENCOUNTER — Encounter (HOSPITAL_BASED_OUTPATIENT_CLINIC_OR_DEPARTMENT_OTHER): Payer: Self-pay | Admitting: Physical Therapy

## 2024-12-10 ENCOUNTER — Ambulatory Visit (HOSPITAL_BASED_OUTPATIENT_CLINIC_OR_DEPARTMENT_OTHER): Payer: Self-pay | Admitting: Physical Therapy

## 2024-12-12 ENCOUNTER — Ambulatory Visit (HOSPITAL_BASED_OUTPATIENT_CLINIC_OR_DEPARTMENT_OTHER): Payer: Self-pay | Admitting: Physical Therapy

## 2024-12-20 ENCOUNTER — Ambulatory Visit (HOSPITAL_BASED_OUTPATIENT_CLINIC_OR_DEPARTMENT_OTHER): Admitting: Orthopaedic Surgery

## 2024-12-20 DIAGNOSIS — M75122 Complete rotator cuff tear or rupture of left shoulder, not specified as traumatic: Secondary | ICD-10-CM

## 2024-12-20 NOTE — Progress Notes (Signed)
 "                                Post Operative Evaluation    Procedure/Date of Surgery: Left shoulder reverse shoulder arthroplasty 11/19  Interval History:    Presents 6 weeks status post above procedure.  He is doing extremely well at this time.  Overhead range of motion is coming along nicely.  Denies any significant pain   PMH/PSH/Family History/Social History/Meds/Allergies:    Past Medical History:  Diagnosis Date   Acid reflux    C. difficile colitis    DVT (deep venous thrombosis) (HCC) 2014   left leg   Factor 5 Leiden mutation, heterozygous    Heart murmur    High blood cholesterol level    Hypertension    Right shoulder pain    Past Surgical History:  Procedure Laterality Date   AORTIC ARCH ANGIOGRAPHY N/A 10/07/2022   Procedure: AORTIC ARCH ANGIOGRAPHY;  Surgeon: Preston Debby SAILOR, MD;  Location: [facility];  Service: Cardiovascular;  Laterality: N/A;   APPENDECTOMY     bone graft  right great toe     for bunion   PERIPHERAL VASCULAR INTERVENTION Left 10/07/2022   Procedure: PERIPHERAL VASCULAR INTERVENTION;  Surgeon: Preston Debby SAILOR, MD;  Location: [facility];  Service: Cardiovascular;  Laterality: Left;  Subclavian   SHOULDER ARTHROSCOPY WITH ROTATOR CUFF REPAIR Left 10/24/2023   Procedure: LEFT SHOULDER ARTHROSCOPY ROTATOR CUFF REPAIR;  Surgeon: Preston Standing, MD;  Location: [facility];  Service: Orthopedics;  Laterality: Left;   SHOULDER OPEN ROTATOR CUFF REPAIR Right 05/14/2013   Procedure: ROTATOR CUFF REPAIR SHOULDER OPEN;  Surgeon: Preston Stable, MD;  Location: [facility];  Service: Orthopedics;  Laterality: Right;   VASECTOMY     Social History   Socioeconomic History   Marital status: Married    Spouse name: Not on file   Number of children: Not on file   Years of education: Not on file   Highest education level: Not on file  Occupational History   Not on file  Tobacco Use   Smoking status: Former    Current  packs/day: 0.00    Average packs/day: 1.0 packs/day    Types: Cigarettes    Quit date: 06/03/2013    Years since quitting: 11.5    Passive exposure: Never   Smokeless tobacco: Never  Vaping Use   Vaping status: Never Used  Substance and Sexual Activity   Alcohol use: No   Drug use: No   Sexual activity: Yes    Birth control/protection: Other-see comments    Comment: had vasectomy  Other Topics Concern   Not on file  Social History Narrative   Not on file   Social Drivers of Health   Tobacco Use: Medium Risk (12/03/2024)   Patient History    Smoking Tobacco Use: Former    Smokeless Tobacco Use: Never    Passive Exposure: Never  Physicist, Medical Strain: Low Risk (10/21/2024)   Received from [facility]   Overall Financial Resource Strain (CARDIA)    How hard is it for you to pay for the very basics like food, housing, medical care, and heating?: Not hard at all  Food Insecurity: No Food Insecurity (10/21/2024)   Received from [facility]   Epic    Within the past 12 months, you worried that your food would run out before you got the money to  buy more.: Never true    Within the past 12 months, the food you bought just didn't last and you didn't have money to get more.: Never true  Transportation Needs: No Transportation Needs (10/21/2024)   Received from [facility]    In the past 12 months, has lack of transportation kept you from medical appointments or from getting medications?: No    In the past 12 months, has lack of transportation kept you from meetings, work, or from getting things needed for daily living?: No  Physical Activity: Insufficiently Active (10/21/2024)   Received from [facility]   Exercise Vital Sign    On average, how many days per week do you engage in moderate to strenuous exercise (like a brisk walk)?: 2 days    On average, how many minutes do you engage in exercise at this level?: 30 min  Stress: No Stress Concern Present  (10/21/2024)   Received from [facility] of Occupational Health - Occupational Stress Questionnaire    Do you feel stress - tense, restless, nervous, or anxious, or unable to sleep at night because your mind is troubled all the time - these days?: Not at all  Social Connections: Moderately Integrated (10/21/2024)   Received from [facility]   Social Network    How would you rate your social network (family, work, friends)?: Adequate participation with social networks  Depression (PHQ2-9): Not on file  Alcohol Screen: Not on file  Housing: Low Risk (10/21/2024)   Received from [facility]    In the last 12 months, was there a time when you were not able to pay the mortgage or rent on time?: No    In the past 12 months, how many times have you moved where you were living?: 0    At any time in the past 12 months, were you homeless or living in a shelter (including now)?: No  Utilities: Not At Risk (10/21/2024)   Received from [facility]    In the past 12 months has the electric, gas, oil, or water  company threatened to shut off services in your home?: No  Health Literacy: Not on file   Family History  Problem Relation Age of Onset   Clotting disorder Mother    Diabetes Paternal Grandmother    Allergies[1] Current Outpatient Medications  Medication Sig Dispense Refill   ascorbic acid (VITAMIN C) 500 MG tablet Take 500 mg by mouth daily.     aspirin  EC 325 MG tablet Take 1 tablet (325 mg total) by mouth daily. 14 tablet 0   aspirin  EC 81 MG tablet Take 81 mg by mouth daily. Swallow whole.     atenolol (TENORMIN) 25 MG tablet Take 25 mg by mouth daily.     atorvastatin  (LIPITOR) 40 MG tablet TAKE 1 TABLET BY MOUTH EVERY DAY 90 tablet 0   atorvastatin  (LIPITOR) 40 MG tablet Take 1 tablet (40 mg total) by mouth daily. 90 tablet 0   Atorvastatin  Calcium  (ATORVALIQ) 20 MG/5ML SUSP Take by mouth.     clopidogrel  (PLAVIX ) 75 MG tablet TAKE 1 TABLET  BY MOUTH EVERY DAY 90 tablet 3   Clopidogrel  Bisulfate (PLAVIX  PO) Take by mouth.     ezetimibe (ZETIA) 10 MG tablet Take 10 mg by mouth daily.     glimepiride (AMARYL) 2 MG tablet Take 2 mg by mouth daily with breakfast.     HYDROcodone -acetaminophen  (NORCO) 10-325 MG  tablet Take 1 tablet by mouth every 6 (six) hours.     oxyCODONE  (ROXICODONE ) 5 MG immediate release tablet Take 1 tablet (5 mg total) by mouth every 4 (four) hours as needed for severe pain (pain score 7-10) or breakthrough pain. 15 tablet 0   oxyCODONE  (ROXICODONE ) 5 MG immediate release tablet Take 1 tablet (5 mg total) by mouth every 4 (four) hours as needed for severe pain (pain score 7-10) or breakthrough pain. 15 tablet 0   oxyCODONE  (ROXICODONE ) 5 MG immediate release tablet Take 1 tablet (5 mg total) by mouth every 4 (four) hours as needed for severe pain (pain score 7-10) or breakthrough pain. 25 tablet 0   sildenafil (VIAGRA) 100 MG tablet Take 50-100 mg by mouth daily as needed for erectile dysfunction.     traMADol  (ULTRAM ) 50 MG tablet Take 1 tablet (50 mg total) by mouth every 6 (six) hours as needed. 30 tablet 1   zolpidem  (AMBIEN ) 10 MG tablet Take 10 mg by mouth at bedtime.     No current facility-administered medications for this visit.   No results found.  Review of Systems:   A ROS was performed including pertinent positives and negatives as documented in the HPI.   Musculoskeletal Exam:    There were no vitals taken for this visit.  Left shoulder incisions well-appearing without erythema or drainage.  Active forward elevation is to 90 degrees with external Tatian at side to 20 degrees internal rotation deferred today just neurosensory exams intact  Imaging:    3 views left shoulder: Status post reverse shoulder arthroplasty without evidence of complication  I personally reviewed and interpreted the radiographs.   Assessment:   6 weeks status post left shoulder reverse shoulder arthroplasty  without evidence complication overall doing extremely well.  I will plan to see him back in 6 weeks for reassessment  Plan :    - Return to clinic 6 weeks for reassessment      I personally saw and evaluated the patient, and participated in the management and treatment plan.  Elspeth Parker, MD Attending Physician, Orthopedic Surgery  This document was dictated using Dragon voice recognition software. A reasonable attempt at proof reading has been made to minimize errors.     [1]  Allergies Allergen Reactions   Benicar [Olmesartan] Other (See Comments)    REACTION: Unable to regulate blood pressure with this medication--caused BP levels to drop too low.   "

## 2025-01-31 ENCOUNTER — Encounter (HOSPITAL_BASED_OUTPATIENT_CLINIC_OR_DEPARTMENT_OTHER): Admitting: Orthopaedic Surgery
